# Patient Record
Sex: Female | Born: 1955 | Race: Black or African American | Hispanic: No | Smoking: Former smoker
Health system: Southern US, Community
[De-identification: ages and names within clinical notes are randomized; demographics above are authoritative.]

## PROBLEM LIST (undated history)

## (undated) DIAGNOSIS — N63 Unspecified lump in unspecified breast: Secondary | ICD-10-CM

## (undated) DIAGNOSIS — M1990 Unspecified osteoarthritis, unspecified site: Secondary | ICD-10-CM

## (undated) DIAGNOSIS — R112 Nausea with vomiting, unspecified: Secondary | ICD-10-CM

## (undated) DIAGNOSIS — I10 Essential (primary) hypertension: Secondary | ICD-10-CM

## (undated) DIAGNOSIS — L989 Disorder of the skin and subcutaneous tissue, unspecified: Secondary | ICD-10-CM

## (undated) DIAGNOSIS — D649 Anemia, unspecified: Secondary | ICD-10-CM

## (undated) DIAGNOSIS — C50919 Malignant neoplasm of unspecified site of unspecified female breast: Secondary | ICD-10-CM

## (undated) DIAGNOSIS — M7550 Bursitis of unspecified shoulder: Secondary | ICD-10-CM

## (undated) DIAGNOSIS — Z923 Personal history of irradiation: Secondary | ICD-10-CM

## (undated) DIAGNOSIS — C801 Malignant (primary) neoplasm, unspecified: Secondary | ICD-10-CM

## (undated) DIAGNOSIS — R222 Localized swelling, mass and lump, trunk: Secondary | ICD-10-CM

## (undated) DIAGNOSIS — Z9889 Other specified postprocedural states: Secondary | ICD-10-CM

## (undated) HISTORY — DX: Malignant neoplasm of unspecified site of unspecified female breast: C50.919

## (undated) HISTORY — DX: Essential (primary) hypertension: I10

## (undated) HISTORY — DX: Disorder of the skin and subcutaneous tissue, unspecified: L98.9

## (undated) HISTORY — DX: Unspecified lump in unspecified breast: N63.0

## (undated) HISTORY — DX: Malignant (primary) neoplasm, unspecified: C80.1

## (undated) HISTORY — DX: Unspecified osteoarthritis, unspecified site: M19.90

## (undated) HISTORY — PX: TOTAL HIP ARTHROPLASTY: SHX124

---

## 2002-02-02 ENCOUNTER — Emergency Department (HOSPITAL_COMMUNITY): Admission: EM | Admit: 2002-02-02 | Discharge: 2002-02-02 | Payer: Self-pay | Admitting: Emergency Medicine

## 2002-12-07 ENCOUNTER — Emergency Department (HOSPITAL_COMMUNITY): Admission: EM | Admit: 2002-12-07 | Discharge: 2002-12-07 | Payer: Self-pay | Admitting: Emergency Medicine

## 2002-12-07 ENCOUNTER — Encounter: Payer: Self-pay | Admitting: Emergency Medicine

## 2003-09-20 ENCOUNTER — Emergency Department (HOSPITAL_COMMUNITY): Admission: EM | Admit: 2003-09-20 | Discharge: 2003-09-20 | Payer: Self-pay | Admitting: Emergency Medicine

## 2004-01-29 ENCOUNTER — Inpatient Hospital Stay (HOSPITAL_COMMUNITY): Admission: RE | Admit: 2004-01-29 | Discharge: 2004-02-02 | Payer: Self-pay | Admitting: Orthopedic Surgery

## 2006-02-02 ENCOUNTER — Emergency Department (HOSPITAL_COMMUNITY): Admission: EM | Admit: 2006-02-02 | Discharge: 2006-02-02 | Payer: Self-pay | Admitting: Emergency Medicine

## 2007-05-17 ENCOUNTER — Encounter: Admission: RE | Admit: 2007-05-17 | Discharge: 2007-05-17 | Payer: Self-pay | Admitting: Internal Medicine

## 2007-05-23 ENCOUNTER — Encounter: Admission: RE | Admit: 2007-05-23 | Discharge: 2007-06-16 | Payer: Self-pay | Admitting: Internal Medicine

## 2007-07-27 HISTORY — PX: BREAST SURGERY: SHX581

## 2008-04-16 ENCOUNTER — Encounter: Admission: RE | Admit: 2008-04-16 | Discharge: 2008-04-16 | Payer: Self-pay | Admitting: Internal Medicine

## 2008-04-26 ENCOUNTER — Encounter: Admission: RE | Admit: 2008-04-26 | Discharge: 2008-04-26 | Payer: Self-pay | Admitting: Internal Medicine

## 2008-04-26 ENCOUNTER — Encounter (INDEPENDENT_AMBULATORY_CARE_PROVIDER_SITE_OTHER): Payer: Self-pay | Admitting: Diagnostic Radiology

## 2008-04-29 HISTORY — PX: BREAST BIOPSY: SHX20

## 2008-05-07 ENCOUNTER — Encounter: Admission: RE | Admit: 2008-05-07 | Discharge: 2008-05-07 | Payer: Self-pay | Admitting: Internal Medicine

## 2008-05-13 ENCOUNTER — Encounter: Admission: RE | Admit: 2008-05-13 | Discharge: 2008-05-13 | Payer: Self-pay | Admitting: General Surgery

## 2008-05-14 ENCOUNTER — Encounter: Admission: RE | Admit: 2008-05-14 | Discharge: 2008-05-14 | Payer: Self-pay | Admitting: General Surgery

## 2008-05-14 ENCOUNTER — Ambulatory Visit (HOSPITAL_BASED_OUTPATIENT_CLINIC_OR_DEPARTMENT_OTHER): Admission: RE | Admit: 2008-05-14 | Discharge: 2008-05-14 | Payer: Self-pay | Admitting: General Surgery

## 2008-05-14 ENCOUNTER — Encounter (INDEPENDENT_AMBULATORY_CARE_PROVIDER_SITE_OTHER): Payer: Self-pay | Admitting: General Surgery

## 2008-05-22 ENCOUNTER — Ambulatory Visit: Payer: Self-pay | Admitting: Oncology

## 2008-05-28 ENCOUNTER — Ambulatory Visit: Admission: RE | Admit: 2008-05-28 | Discharge: 2008-07-01 | Payer: Self-pay | Admitting: Radiation Oncology

## 2008-05-30 ENCOUNTER — Ambulatory Visit (HOSPITAL_BASED_OUTPATIENT_CLINIC_OR_DEPARTMENT_OTHER): Admission: RE | Admit: 2008-05-30 | Discharge: 2008-05-30 | Payer: Self-pay | Admitting: General Surgery

## 2008-05-30 ENCOUNTER — Encounter (INDEPENDENT_AMBULATORY_CARE_PROVIDER_SITE_OTHER): Payer: Self-pay | Admitting: General Surgery

## 2008-06-04 HISTORY — PX: BREAST LUMPECTOMY: SHX2

## 2008-06-10 LAB — CBC WITH DIFFERENTIAL/PLATELET
Basophils Absolute: 0 10*3/uL (ref 0.0–0.1)
EOS%: 0.7 % (ref 0.0–7.0)
HCT: 34.8 % (ref 34.8–46.6)
HGB: 11.8 g/dL (ref 11.6–15.9)
MCH: 29.4 pg (ref 26.0–34.0)
MCV: 86.2 fL (ref 81.0–101.0)
MONO%: 9.8 % (ref 0.0–13.0)
NEUT%: 59.1 % (ref 39.6–76.8)

## 2008-06-10 LAB — COMPREHENSIVE METABOLIC PANEL
AST: 14 U/L (ref 0–37)
Alkaline Phosphatase: 75 U/L (ref 39–117)
BUN: 16 mg/dL (ref 6–23)
Calcium: 9.9 mg/dL (ref 8.4–10.5)
Chloride: 105 mEq/L (ref 96–112)
Creatinine, Ser: 0.96 mg/dL (ref 0.40–1.20)
Glucose, Bld: 96 mg/dL (ref 70–99)

## 2008-07-05 ENCOUNTER — Ambulatory Visit: Admission: RE | Admit: 2008-07-05 | Discharge: 2008-09-17 | Payer: Self-pay | Admitting: Radiation Oncology

## 2008-09-04 ENCOUNTER — Ambulatory Visit: Payer: Self-pay | Admitting: Oncology

## 2008-09-12 LAB — CBC WITH DIFFERENTIAL/PLATELET
BASO%: 0.3 % (ref 0.0–2.0)
EOS%: 1.7 % (ref 0.0–7.0)
HCT: 37.2 % (ref 34.8–46.6)
LYMPH%: 20.2 % (ref 14.0–48.0)
MCH: 29.1 pg (ref 26.0–34.0)
MCHC: 33.8 g/dL (ref 32.0–36.0)
NEUT%: 66.7 % (ref 39.6–76.8)
lymph#: 2 10*3/uL (ref 0.9–3.3)

## 2008-09-12 LAB — COMPREHENSIVE METABOLIC PANEL
ALT: 14 U/L (ref 0–35)
AST: 19 U/L (ref 0–37)
Alkaline Phosphatase: 76 U/L (ref 39–117)
Chloride: 102 mEq/L (ref 96–112)
Creatinine, Ser: 1.01 mg/dL (ref 0.40–1.20)
Total Bilirubin: 0.4 mg/dL (ref 0.3–1.2)

## 2008-10-14 ENCOUNTER — Ambulatory Visit: Payer: Self-pay | Admitting: Oncology

## 2008-10-16 LAB — COMPREHENSIVE METABOLIC PANEL
Albumin: 4.2 g/dL (ref 3.5–5.2)
BUN: 13 mg/dL (ref 6–23)
CO2: 26 mEq/L (ref 19–32)
Calcium: 9.6 mg/dL (ref 8.4–10.5)
Chloride: 105 mEq/L (ref 96–112)
Glucose, Bld: 103 mg/dL — ABNORMAL HIGH (ref 70–99)
Potassium: 3.9 mEq/L (ref 3.5–5.3)
Sodium: 141 mEq/L (ref 135–145)
Total Protein: 7.2 g/dL (ref 6.0–8.3)

## 2008-10-16 LAB — CBC WITH DIFFERENTIAL/PLATELET
Basophils Absolute: 0 10*3/uL (ref 0.0–0.1)
Eosinophils Absolute: 0.1 10*3/uL (ref 0.0–0.5)
HGB: 12.3 g/dL (ref 11.6–15.9)
MCV: 84.7 fL (ref 79.5–101.0)
MONO#: 0.6 10*3/uL (ref 0.1–0.9)
NEUT#: 6.3 10*3/uL (ref 1.5–6.5)
RBC: 4.29 10*6/uL (ref 3.70–5.45)
RDW: 15.5 % — ABNORMAL HIGH (ref 11.2–14.5)
WBC: 9 10*3/uL (ref 3.9–10.3)
lymph#: 2 10*3/uL (ref 0.9–3.3)

## 2009-02-19 ENCOUNTER — Ambulatory Visit: Payer: Self-pay | Admitting: Oncology

## 2009-02-24 LAB — CBC WITH DIFFERENTIAL/PLATELET
Eosinophils Absolute: 0.1 10*3/uL (ref 0.0–0.5)
HCT: 36 % (ref 34.8–46.6)
LYMPH%: 23 % (ref 14.0–49.7)
MONO#: 0.7 10*3/uL (ref 0.1–0.9)
NEUT#: 6.6 10*3/uL — ABNORMAL HIGH (ref 1.5–6.5)
NEUT%: 68.3 % (ref 38.4–76.8)
Platelets: 327 10*3/uL (ref 145–400)
WBC: 9.7 10*3/uL (ref 3.9–10.3)
lymph#: 2.2 10*3/uL (ref 0.9–3.3)

## 2009-02-24 LAB — COMPREHENSIVE METABOLIC PANEL
AST: 15 U/L (ref 0–37)
Albumin: 3.8 g/dL (ref 3.5–5.2)
BUN: 17 mg/dL (ref 6–23)
Calcium: 9.6 mg/dL (ref 8.4–10.5)
Chloride: 102 mEq/L (ref 96–112)
Creatinine, Ser: 0.85 mg/dL (ref 0.40–1.20)
Glucose, Bld: 105 mg/dL — ABNORMAL HIGH (ref 70–99)

## 2009-02-24 LAB — LACTATE DEHYDROGENASE: LDH: 156 U/L (ref 94–250)

## 2009-04-17 ENCOUNTER — Encounter: Admission: RE | Admit: 2009-04-17 | Discharge: 2009-04-17 | Payer: Self-pay | Admitting: General Surgery

## 2009-06-24 ENCOUNTER — Ambulatory Visit: Payer: Self-pay | Admitting: Oncology

## 2009-06-26 ENCOUNTER — Ambulatory Visit (HOSPITAL_COMMUNITY): Admission: RE | Admit: 2009-06-26 | Discharge: 2009-06-26 | Payer: Self-pay | Admitting: Oncology

## 2009-06-26 LAB — COMPREHENSIVE METABOLIC PANEL
Alkaline Phosphatase: 66 U/L (ref 39–117)
BUN: 19 mg/dL (ref 6–23)
Creatinine, Ser: 0.92 mg/dL (ref 0.40–1.20)
Glucose, Bld: 90 mg/dL (ref 70–99)
Total Bilirubin: 0.3 mg/dL (ref 0.3–1.2)

## 2009-06-26 LAB — CBC WITH DIFFERENTIAL/PLATELET
Basophils Absolute: 0 10*3/uL (ref 0.0–0.1)
Eosinophils Absolute: 0.1 10*3/uL (ref 0.0–0.5)
HCT: 37.5 % (ref 34.8–46.6)
HGB: 12.5 g/dL (ref 11.6–15.9)
LYMPH%: 22.8 % (ref 14.0–49.7)
MCV: 84 fL (ref 79.5–101.0)
MONO%: 9.3 % (ref 0.0–14.0)
NEUT#: 7.2 10*3/uL — ABNORMAL HIGH (ref 1.5–6.5)
NEUT%: 66.8 % (ref 38.4–76.8)
Platelets: 351 10*3/uL (ref 145–400)
RBC: 4.47 10*6/uL (ref 3.70–5.45)

## 2009-06-26 LAB — LACTATE DEHYDROGENASE: LDH: 138 U/L (ref 94–250)

## 2009-07-24 ENCOUNTER — Ambulatory Visit (HOSPITAL_COMMUNITY): Admission: RE | Admit: 2009-07-24 | Discharge: 2009-07-24 | Payer: Self-pay | Admitting: Oncology

## 2009-10-24 ENCOUNTER — Ambulatory Visit: Payer: Self-pay | Admitting: Oncology

## 2009-10-28 LAB — COMPREHENSIVE METABOLIC PANEL
ALT: 10 U/L (ref 0–35)
AST: 12 U/L (ref 0–37)
Alkaline Phosphatase: 65 U/L (ref 39–117)
Calcium: 9.3 mg/dL (ref 8.4–10.5)
Chloride: 101 mEq/L (ref 96–112)
Creatinine, Ser: 0.88 mg/dL (ref 0.40–1.20)
Glucose, Bld: 98 mg/dL (ref 70–99)
Total Bilirubin: 0.3 mg/dL (ref 0.3–1.2)
Total Protein: 7 g/dL (ref 6.0–8.3)

## 2009-10-28 LAB — CBC WITH DIFFERENTIAL/PLATELET
BASO%: 0.2 % (ref 0.0–2.0)
EOS%: 0.7 % (ref 0.0–7.0)
LYMPH%: 22.6 % (ref 14.0–49.7)
MCHC: 32.6 g/dL (ref 31.5–36.0)
RBC: 4.43 10*6/uL (ref 3.70–5.45)
WBC: 10.5 10*3/uL — ABNORMAL HIGH (ref 3.9–10.3)

## 2009-10-28 LAB — LACTATE DEHYDROGENASE: LDH: 151 U/L (ref 94–250)

## 2010-02-25 ENCOUNTER — Ambulatory Visit: Payer: Self-pay | Admitting: Oncology

## 2010-02-27 LAB — COMPREHENSIVE METABOLIC PANEL
ALT: 9 U/L (ref 0–35)
Albumin: 3.9 g/dL (ref 3.5–5.2)
Alkaline Phosphatase: 60 U/L (ref 39–117)
BUN: 13 mg/dL (ref 6–23)
CO2: 25 mEq/L (ref 19–32)
Calcium: 9.7 mg/dL (ref 8.4–10.5)
Creatinine, Ser: 0.9 mg/dL (ref 0.40–1.20)
Potassium: 3.6 mEq/L (ref 3.5–5.3)
Total Bilirubin: 0.3 mg/dL (ref 0.3–1.2)

## 2010-02-27 LAB — CBC WITH DIFFERENTIAL/PLATELET
HCT: 38.1 % (ref 34.8–46.6)
HGB: 12.5 g/dL (ref 11.6–15.9)
MCHC: 32.8 g/dL (ref 31.5–36.0)
RDW: 16.4 % — ABNORMAL HIGH (ref 11.2–14.5)
WBC: 10.5 10*3/uL — ABNORMAL HIGH (ref 3.9–10.3)
nRBC: 0 % (ref 0–0)

## 2010-04-15 ENCOUNTER — Other Ambulatory Visit: Admission: RE | Admit: 2010-04-15 | Discharge: 2010-04-15 | Payer: Self-pay | Admitting: Obstetrics and Gynecology

## 2010-04-20 ENCOUNTER — Encounter: Admission: RE | Admit: 2010-04-20 | Discharge: 2010-04-20 | Payer: Self-pay | Admitting: Internal Medicine

## 2010-06-26 ENCOUNTER — Ambulatory Visit: Payer: Self-pay | Admitting: Oncology

## 2010-06-29 ENCOUNTER — Ambulatory Visit (HOSPITAL_COMMUNITY)
Admission: RE | Admit: 2010-06-29 | Discharge: 2010-06-29 | Payer: Self-pay | Source: Home / Self Care | Admitting: Oncology

## 2010-06-29 LAB — CBC WITH DIFFERENTIAL/PLATELET
Basophils Absolute: 0.1 10*3/uL (ref 0.0–0.1)
Eosinophils Absolute: 0.1 10*3/uL (ref 0.0–0.5)
HGB: 12.1 g/dL (ref 11.6–15.9)
LYMPH%: 23.1 % (ref 14.0–49.7)
MCH: 27.9 pg (ref 25.1–34.0)
MONO#: 0.9 10*3/uL (ref 0.1–0.9)
NEUT%: 65.9 % (ref 38.4–76.8)
RBC: 4.33 10*6/uL (ref 3.70–5.45)
lymph#: 2.2 10*3/uL (ref 0.9–3.3)

## 2010-06-29 LAB — COMPREHENSIVE METABOLIC PANEL
ALT: 11 U/L (ref 0–35)
AST: 14 U/L (ref 0–37)
Alkaline Phosphatase: 66 U/L (ref 39–117)
CO2: 27 mEq/L (ref 19–32)
Calcium: 9.7 mg/dL (ref 8.4–10.5)
Creatinine, Ser: 0.89 mg/dL (ref 0.40–1.20)
Total Bilirubin: 0.3 mg/dL (ref 0.3–1.2)

## 2010-09-01 ENCOUNTER — Encounter (HOSPITAL_BASED_OUTPATIENT_CLINIC_OR_DEPARTMENT_OTHER): Payer: PRIVATE HEALTH INSURANCE | Admitting: Oncology

## 2010-09-01 DIAGNOSIS — C50419 Malignant neoplasm of upper-outer quadrant of unspecified female breast: Secondary | ICD-10-CM

## 2010-09-01 DIAGNOSIS — D0590 Unspecified type of carcinoma in situ of unspecified breast: Secondary | ICD-10-CM

## 2010-09-16 ENCOUNTER — Other Ambulatory Visit (HOSPITAL_COMMUNITY): Payer: Self-pay | Admitting: Oncology

## 2010-09-16 DIAGNOSIS — Z09 Encounter for follow-up examination after completed treatment for conditions other than malignant neoplasm: Secondary | ICD-10-CM

## 2010-09-29 ENCOUNTER — Encounter (HOSPITAL_BASED_OUTPATIENT_CLINIC_OR_DEPARTMENT_OTHER): Payer: PRIVATE HEALTH INSURANCE | Admitting: Oncology

## 2010-09-29 DIAGNOSIS — C50419 Malignant neoplasm of upper-outer quadrant of unspecified female breast: Secondary | ICD-10-CM

## 2010-09-29 DIAGNOSIS — D0590 Unspecified type of carcinoma in situ of unspecified breast: Secondary | ICD-10-CM

## 2010-10-05 ENCOUNTER — Other Ambulatory Visit: Payer: PRIVATE HEALTH INSURANCE

## 2010-10-26 ENCOUNTER — Other Ambulatory Visit: Payer: PRIVATE HEALTH INSURANCE

## 2010-10-29 ENCOUNTER — Other Ambulatory Visit (HOSPITAL_COMMUNITY): Payer: Self-pay | Admitting: Oncology

## 2010-10-29 ENCOUNTER — Encounter (HOSPITAL_BASED_OUTPATIENT_CLINIC_OR_DEPARTMENT_OTHER): Payer: PRIVATE HEALTH INSURANCE | Admitting: Oncology

## 2010-10-29 DIAGNOSIS — C50419 Malignant neoplasm of upper-outer quadrant of unspecified female breast: Secondary | ICD-10-CM

## 2010-10-29 DIAGNOSIS — D0590 Unspecified type of carcinoma in situ of unspecified breast: Secondary | ICD-10-CM

## 2010-10-29 DIAGNOSIS — N959 Unspecified menopausal and perimenopausal disorder: Secondary | ICD-10-CM

## 2010-10-29 DIAGNOSIS — Z170 Estrogen receptor positive status [ER+]: Secondary | ICD-10-CM

## 2010-10-29 LAB — COMPREHENSIVE METABOLIC PANEL
ALT: <8 U/L (ref 0–35)
Alkaline Phosphatase: 66 U/L (ref 39–117)
Sodium: 141 mEq/L (ref 135–145)
Total Bilirubin: 0.3 mg/dL (ref 0.3–1.2)
Total Protein: 6.9 g/dL (ref 6.0–8.3)

## 2010-10-29 LAB — CBC WITH DIFFERENTIAL/PLATELET
HGB: 11.4 g/dL — ABNORMAL LOW (ref 11.6–15.9)
MCH: 27 pg (ref 25.1–34.0)
MONO%: 9.2 % (ref 0.0–14.0)
RDW: 17.2 % — ABNORMAL HIGH (ref 11.2–14.5)
WBC: 8.8 10*3/uL (ref 3.9–10.3)
lymph#: 2 10*3/uL (ref 0.9–3.3)

## 2010-11-16 ENCOUNTER — Ambulatory Visit
Admission: RE | Admit: 2010-11-16 | Discharge: 2010-11-16 | Disposition: A | Discharge status: Home / Self Care | Payer: PRIVATE HEALTH INSURANCE | Source: Ambulatory Visit | Attending: Oncology | Admitting: Oncology

## 2010-11-16 DIAGNOSIS — Z09 Encounter for follow-up examination after completed treatment for conditions other than malignant neoplasm: Secondary | ICD-10-CM

## 2010-12-08 NOTE — Op Note (Signed)
NAMEGEARLDINE, Kelli Abbott                 ACCOUNT NO.:  [id number]   MEDICAL RECORD NO.:  [id number]          PATIENT TYPE:  AMB   LOCATION:  DSC                          FACILITY:  [facility]   PHYSICIAN:  Almond Lint, MD       DATE OF BIRTH:  07-16-1956   DATE OF PROCEDURE:  05/30/2008  DATE OF DISCHARGE:                               OPERATIVE REPORT   PREOPERATIVE DIAGNOSIS:  T1, N0, Mx breast cancer on the right.   POSTOPERATIVE DIAGNOSIS:  T1, N0, Mx breast cancer on the right.   PROCEDURE PERFORMED:  Re-excision of right breast cancer margins.   SURGEON:  Almond Lint, MD   ANESTHESIA:  MAC and local.   FINDINGS:  Small seroma.   SPECIMENS:  New lateral margin and new posterior margins to Pathology.   ESTIMATED BLOOD LOSS:  Minimal.   COMPLICATIONS:  Unknown.   PROCEDURE:  [name] was identified in the holding area, taken to the  operating room where was she was placed supine on the operating room  table.  MAC anesthesia was induced.  Her right breast was prepped and  draped in sterile fashion.  Time-out was performed according to the  surgical safety checklist.  When things were correct, we elected to  continue.  The incision was anesthetized with a mixture of 1% lidocaine  plain and 0.25% Marcaine with epinephrine.  Deep tissues were also  anesthetized.  The old incision was entered sharply and the subcutaneous  tissues also entered with a knife.  Small seroma was evacuated.  [employer], water were placed into the breast and the lateral margin was  identified.  Further numbing medicine was placed in this area.  A large  crescent of tissue was taken on the lateral margin with the Bovie  electrocautery.  This was then marked as short superior, long new  lateral margin.  This area was inspected for hemostasis and was achieved  with the Bovie electrocautery.  The posterior margin was then identified  and additional tissue was taken posteriorly along the back wall of the  cavity.  This also was performed with the Bovie electrocautery.  Once  this was removed, this also was marked short superior long new posterior  margin.  The area was irrigated copiously and re-inspected for  hemostasis.  There were several areas that had to be coagulated with the  Bovie electrocautery.  Once that was done, the clips were placed at the  lateral, medial, superior, inferior, and posterior borders of the  cavity.  The area was re-irrigated and the skin margins were trimmed  with tenotomy scissors.  The skin edges were coagulated with the Bovie  electrocautery on the dermal  portion to control the bleeding.  Skin edges were then re-approximated  with 3-0 Vicryl interrupted deep dermal sutures and 4-0 Monocryl running  subcuticular suture.  The patient was awakened from anesthesia and taken  to the PACU in stable condition.      Almond Lint, MD  Electronically Signed     FB/MEDQ  D:  05/30/2008  T:  05/31/2008  Job:  [id number]

## 2010-12-08 NOTE — Op Note (Signed)
Kelli Abbott, Kelli Abbott                 ACCOUNT NO.:  [id number]   MEDICAL RECORD NO.:  [id number]          PATIENT TYPE:  AMB   LOCATION:  DSC                          FACILITY:  [facility]   PHYSICIAN:  Almond Lint, MD       DATE OF BIRTH:  [birth year]   DATE OF PROCEDURE:  05/14/2008  DATE OF DISCHARGE:                               OPERATIVE REPORT   PREOPERATIVE DIAGNOSIS:  Right breast cancer.   POSTOPERATIVE DIAGNOSIS:  Right breast cancer.   PROCEDURE PERFORMED:  1. Right needle localized segmental mastectomy.  2. Right-sided lymphoscintigraphy.  3. Right sentinel lymph node biopsy.  4. Mastopexy.   SURGEON:  Almond Lint, MD   ASSISTANT:  None.   ANESTHESIA:  General and local.   SPECIMENS:  1. Sentinel lymph node with counts per second of 1600 on average.  2. Palpable node, background was 20 counts per second.   OTHER FINDINGS:  On frozen, sentinel lymph node #1 was 2 lymph nodes  which were negative and sentinel lymph node #2 which was the third node  was also negative.  The right breast specimen findings were that the  clip and the mammography abnormality was in the breast specimen.   FINDINGS:  Negative sentinel lymph node by frozen.   ESTIMATED BLOOD LOSS:  25 mL.   COMPLICATIONS:  None known.   PROCEDURE:  Ms. Tabares was identified in the holding area and taken to  the operating room where she was placed supine on the operating room  table.  General anesthesia was induced.  Her right breast and arm were  prepped and draped in a sterile fashion such that the arm was free  draped into the field.  The probe was used to identify the point of  maximum counts.  Blue dye was injected into the breast at the area of  the tumor.  This was 2 mL of methylene blue mixed in a total of 5 mL of  volume made up by saline.  The breast was massaged for several minutes  and at least 5 minutes was allowed to elapse prior to incision.  The  crease of the axilla was the point of  maximum counts.  A 4-5 cm incision  was made in this crease and the subcutaneous tissue was divided with  Bovie electrocautery.  The clavipectoral fascia was traversed with the  Bovie as well.  The probe was used to direct the dissection further.  There was immediately a lymph node visible that was both palpable and  had a hint of blue in one of the sides of the lymph node.  This was  elevated with an Allis and the Bovie was used to dissect the areolar  attachments and the lymphatics and blood vessels entering the node were  clipped with the automatic clip applier.  There was an additional node  that was palpable beside of this and this was taken in a similar  fashion.  There was a small vessel leading to the second node that tore  and this was elevated and clipped with the clip applier.  Once these 2  nodes were removed, their counts for second were examined.  The first  node with 1600 counts per second on average and the second node was  palpable, but not hard.  The remainder of the axilla was examined with  the NeoProbe and the background count was 20 counts per second.  The  area in axilla was then irrigated and several areas of bleeding were  addressed first with the clip applier and then with the Bovie  electrocautery.  The axilla was palpated and there were several shotty  lymph nodes palpable, but no large lymph nodes palpable.  The axilla was  then again irrigated and packed with a sponge while the attention was  directed to the breast.  The lymph nodes were sent off for frozen  section.   The right breast mammograms were examined to identify an appropriate  area for an incision.  A 4-cm incision was made that was curvilinear and  parallel to the curve of the areola.  This was approximately 6 cm from  the edge of the areola.  The subcutaneous tissue was divided with the  Bovie electrocautery.  Skin flaps were created using the Bovie and skin  hooks.  When the lateral flap was  created, the wire was pulled into the  wound from the surface of the skin.  Allis clamps were used to elevate  the breast tissue in question.  The Bovie was used to resect the portion  of the breast.  The wire was included in the specimen.  There was a  blood vessel that was encountered at the very lateral edge of the breast  specimen that required clipping.  In doing so, the wire was dislodged  from the specimen.  However, the specimen was removed immediately  thereafter.  The sutures were placed for marking, short, superior, and  long lateral.  The breast was then irrigated copiously.  Bleeding was  addressed with Bovie electrocautery.  There was again irrigation of the  breast tissue and again inspection for hemostasis.  This was then  packed.  At that time, I had the specimen mammogram obtained and the  [facility] confirmed that the clip and mammographic abnormality were  in the specimen.  The breast was begun to be closed and at that point,  Pathology called back and also confirmed negative frozen sections on the  lymph node.  The breast was closed in multiple bleeders because there  was a defect that approximated well with the mastopexy.  More skin flap  was created on the superior aspect in order to bring some the breast  tissue down to close the defect.  This was secured with the 3-0 Vicryl.  Following several interrupted 3-0 Vicryl sutures to improve the cosmesis  of the breast, the skin was then closed using 3-0 Vicryl interrupted  deep dermal sutures and 4-0 Monocryl running subcuticular suture.  The  axilla was closed in a similar fashion with 2 stitches in the  clavipectoral fascia in an interrupted fashion and then the skin was  closed with deep dermal 3-0 Vicryl and 4-0 Monocryl in a running  fashion.  These were then dressed with Steri-Strips.  The axilla was  dressed additionally with gauze and Tegaderm and the breast was dressed  with gauze and Ace wrap.  The patient  was awakened from anesthesia and  taken to PACU in stable condition.  Needle and sponge counts were  correct x2.  Almond Lint, MD  Electronically Signed     FB/MEDQ  D:  05/14/2008  T:  05/15/2008  Job:  [id number]

## 2010-12-11 NOTE — H&P (Signed)
NAME:  Kelli Abbott, Kelli Abbott                           ACCOUNT NO.:  [id number]   MEDICAL RECORD NO.:  [id number]                   PATIENT TYPE:  INP   LOCATION:  0473                                 FACILITY:  [facility]   PHYSICIAN:  Ollen Gross, M.D.                 DATE OF BIRTH:  10-04-1955   DATE OF ADMISSION:  01/29/2004  DATE OF DISCHARGE:  02/02/2004                                HISTORY & PHYSICAL   CHIEF COMPLAINT:  Right hip pain.   HISTORY OF PRESENT ILLNESS:  Patient is a 55 year old female seen by Dr.  Lequita Halt for ongoing right hip pain.  She states she has had a 5-6 month  progressive history with pain in the thigh radiating down to her knee, it is  to the point where it is hurting her all the time.  She was a CNA at [employer], has been unable to work due to her pain.  She had initially  seen Dr. 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  She underwent intraarticular  injection, with anything made her worse.  She is now at a stage where she  cannot get comfortable at all.  She was seen in the office and x-rays of the  knee were essentially normal with only minimal changes on her MRI.  The x-  rays taken of the hip today are seen and found to have extensive arthritic  changes in the right hip with bone-on-bone changes and a large osteophyte  formation.  She is already at a point where she has end-stage arthritis.  It  is felt she has reached the point where she could benefit undergoing hip  replacement.  Risks and benefits have been discussed with the patient and  she elects to receive this surgery.   ALLERGIES:  NONE.  NO DRUG ALLERGIES.   CURRENT MEDICATIONS:  1. Percocet.  2. Diovan 160/12.5 two every a.m.   PAST MEDICAL HISTORY:  1. Hypertension.  2. Arthritis.   PAST SURGICAL HISTORY:  D&C.   SOCIAL HISTORY:  She is single,  works as a Psychologist, sport and exercise.  Less than a pack per  day smoker.  No alcohol.  Has one child.   FAMILY HISTORY:  Mother and father both have a history of hypertension.  Father with a history of arthritis.   REVIEW OF SYSTEMS:  GENERAL:  No fever, chills, or night sweats.  NEURO:  No  __________ paralysis.  RESPIRATORY:  No shortness of breath, productive  cough or hemoptysis.  CARDIOVASCULAR:  No chest pain, angina, orthopnea.  GI:  No nausea, vomiting, diarrhea, constipation.  GU:  No dysuria,  hematuria or discharge.  MUSCULOSKELETAL:  Pertinent to the hip __________.   PHYSICAL EXAMINATION:  VITAL SIGNS:  Pulse 76, respirations 16,  blood  pressure 144/88.  GENERAL:  A 55 year old female well-nourished, well-developed, no acute  distress.  She is alert and oriented, cooperative.  HEENT:  Normocephalic, atraumatic.  Pupils round and reactive.  EOMs are  intact.  NECK:  Supple.  CHEST:  Clear.  HEART:  Regular rate and rhythm.  No murmurs.  ABDOMEN:  Soft, slightly round abdomen.  Bowel sounds present.  RECTAL/BREASTS/GENITALIA:  Not done, not pertinent to present illness.  EXTREMITIES:  __________ to the right hip.  She does have limited flexion in  the hip with only flexion up to about 75 degrees.  There is no internal or  external rotation, only abduction of about 20 degrees.  She does ambulate  with a markedly antalgic gait.   IMPRESSION:  1. Osteoarthritis right hip.  2. Hypertension.   PLAN:  Patient admitted to [facility] to undergo right total hip  arthroplasty.  The surgery will be performed by Dr. Ollen Gross.     Kelli Abbott, P.A.              Ollen Gross, M.D.    ALP/MEDQ  D:  02/23/2004  T:  02/23/2004  Job:  [id number]

## 2010-12-11 NOTE — Discharge Summary (Signed)
NAME:  Kelli Abbott, Kelli Abbott                           ACCOUNT NO.:  [id number]   MEDICAL RECORD NO.:  [id number]                   PATIENT TYPE:  INP   LOCATION:  0473                                 FACILITY:  [facility]   PHYSICIAN:  Ollen Gross, M.D.                 DATE OF BIRTH:  03-04-1956   DATE OF ADMISSION:  01/29/2004  DATE OF DISCHARGE:  02/02/2004                                 DISCHARGE SUMMARY   ADMISSION DIAGNOSES:  1. Osteoarthritis, right hip.  2. Hypertension.   DISCHARGE DIAGNOSES:  1. Osteoarthritis, right hip, status post right total hip arthroplasty.  2. Postoperative hyperkalemia, improved.  3. Postoperative hyponatremia.  4. Hypertension.   PROCEDURE:  The patient was taken to the OR on January 29, 2004, and underwent a  right total hip arthroplasty; surgeon Ollen Gross, M.D.; assistant  Alexzandrew L. Perkins, PA-C; anesthesia general; blood loss was 600 cc;  Hemovac drain times one.   CONSULTATIONS:  None.   BRIEF HISTORY:  This is a 55 year old female with a history of six to eight  month history of progressive debilitating right hip and thigh pain. She was  found to have osteoarthritis proven on x-rays to be quite severe. It was  felt that she would benefit from undergoing a hip replacement. The risks and  benefits discussed. The patient is subsequently admitted to the hospital for  procedure.   LABORATORY DATA:  CBC on admission revealed a hemoglobin of 13.1, hematocrit  39.9, white blood cell count 1.7, red cell count 4.49. Differential within  normal limits. Postoperative H&H 12.6 and 34.5; last known H&H was 11.2 and  33.6.  PT and PTT on admission was 12.5 and 30 respectively with an INR of  0.9. Serial protimes followed total hip protocol. Last known PT-INR 18.4 and  1.8.  Chem panel on admission revealed low albumin of 3.4. The remaining  chem panel within normal limits. Serial BMETs were followed. Sodium dropped  from 138 down to 132 and last noted  131.  Potassium dropped down to 3.5,  down to as low as 2.7, back up to 3.6 with potassium supplement. Glucose  went up from 90 to 132 and back down to 96.  Calcium dropped to 9.3 to 8.3  and back up to 8.6. Urinalysis preoperatively revealed large hemoglobin,  trace leukocyte esterase, a few epithelial cells, 0-2 white cells, 3-6 red  cells, rare bacteria. Blood group type A positive.   EKG dated January 24, 2004, revealed normal sinus rhythm with normal EKG  confirmed by Dr. Rollene Rotunda. Right hip films dated January 24, 2004,  revealed right osteoarthritis. Postoperative hip and pelvis film on January 29, 2004, revealed satisfactory occurrence of right total hip replacement.   HOSPITAL COURSE:  The patient admitted to [facility], taken to the  operating room, and underwent the above-stated procedure without  complications. The patient tolerated the  procedure well, later transferred  to the recovery room, and then to the orthopedic floor for continued  postoperative care. Vital signs were followed, given 24 hours of  postoperative IV antibiotics, placed on Coumadin, placed on partial  weightbearing 50%. PT and OT were consulted postoperatively. Hemovac drain  placed at the time of surgery, pulled on day one. She did get up out of bed  and by day two her pain had greatly improved. She did very well with  physical therapy advancing approximately 90 feet by day two twice, by day  three she did just as well. Dressing changes initiated on day two and  incision was healing well. She continued to walk very well physical therapy.  By day four, she was feeling good, pain under control, and wanted to go  home.   PLAN:  1. The patient is discharged on February 02, 2004.  2. For discharge diagnosis, please see above.  3. Discharge  medications--Percocet, Robaxin, Trinsicon, and Coumadin.  4. Diet--as tolerated, low-sodium diet.  5. Activity--50% partial weightbearing, home health PT and home  health     nursing for total hip protocol.  6. Followup--two weeks from day of surgery, to call the office for an     appointment at [phone].   DISPOSITION:  Home.   CONDITION ON DISCHARGE:  Improved.     Alexzandrew L. Julien Girt, P.A.              Ollen Gross, M.D.    ALP/MEDQ  D:  03/02/2004  T:  03/03/2004  Job:  [id number]   cc:   Georgann Housekeeper, M.D.  [address]  [place]  [place] [address]  Fax: [phone]

## 2010-12-11 NOTE — Op Note (Signed)
NAME:  Kelli Abbott, Kelli Abbott                           ACCOUNT NO.:  [id number]   MEDICAL RECORD NO.:  [id number]                   PATIENT TYPE:  INP   LOCATION:  0005                                 FACILITY:  [facility]   PHYSICIAN:  Ollen Gross, M.D.                 DATE OF BIRTH:  1956-01-04   DATE OF PROCEDURE:  01/29/2004  DATE OF DISCHARGE:                                 OPERATIVE REPORT   PREOPERATIVE DIAGNOSES:  Osteoarthritis right hip.   POSTOPERATIVE DIAGNOSES:  Osteoarthritis right hip.   PROCEDURE:  Right total hip arthroplasty.   SURGEON:  Ollen Gross, M.D.   ASSISTANT:  Alexzandrew L. Julien Girt, P.A.   ANESTHESIA:  General.   ESTIMATED BLOOD LOSS:  600   DRAINS:  Hemovac x1.   COMPLICATIONS:  None.   CONDITION:  Stable to recovery.   BRIEF CLINICAL NOTE:  Kelli Abbott is a 55 year old female who has had a 6-8 month  history of progressively debilitating right thigh and hip pain. Exam and  history suggest osteoarthritis of the hip and radiograph showed severe end-  stage arthritis. She presents now for right total hip arthroplasty secondary  to failure of nonoperative management.   DESCRIPTION OF PROCEDURE:  After successful administration of general  anesthetic, the patient was placed in the left lateral decubitus position  with the right side up and held with a hip positioner. The right lower  extremity was isolated from the perineum with plastic drapes and prepped and  draped in the usual sterile fashion. A long posterolateral incision was made  and the skin cut through the subcutaneous tissue which was about an 8 inch  layer down to the level of the fascia lata. This is incised in line with the  skin incision. The sciatic nerve was palpated and protected and short  rotators isolated off the femur. Capsulectomy was performed and the hip  dislocated. The center of the femoral head is marked and a trial prosthesis  placed such that the center of trial head corresponds  to the center of the  native femoral head. Osteotomy lines marked on the femoral neck and  osteotomy made with an oscillating saw. The femur is then retracted  anteriorly to gain acetabular exposure. She had a rather shallow acetabulum.  We removed the labrum and osteophytes and soft tissue in the fovea and began  reaming at 45 coursing in increments of 2 to a 51 and I placed a 52 mm  pinnacle acetabular shell in anatomic position and transfixed with two dome  screws. There was about 20% on coverage posterior superiorly otherwise  excellent bony coverage. A trial 36 mm neutral liner is placed.   The femur is prepared first with a canal finder and then irrigation. Axial  reaming is performed to 13.5 mm and proximal reaming to an 18D and the  sleeve machined to a small. The 18D small trial sleeve  was placed with 18 x  13 stem at a 36 plus 8 trial neck. We went about 10 degrees beyond her  native anteversion which was neutral. A trial 36 plus zero head was placed.  The hip is reduced with excellent stability, full extension, full external  rotation, 70 degrees of flexion, 40 degrees adduction, 90 degrees internal  rotation and 90 degrees flexion, 70 degrees internal rotation. By placing  the right leg on top of the left, the leg lengths were equal. The hip was  then dislocated and the trial was removed.   The permanent apex hole eliminator was placed into the acetabular shell.  A  permanent 36 mm neutral Ultramet metal liner was placed into the shell. This  is a metal on metal hip replacement. The 18D small sleeve is placed with the  18 x 13 stem and a 36 plus 8 neck again 10 degrees beyond her native  anteversion. A 36 plus zero head is placed and the hip is reduced with the  same stability parameters. The wound is copiously irrigated with antibiotic  solution, short rotators reattached to the femur through drill holes with  Ethibond suture. The fascia lata is closed over a Hemovac drain  with  interrupted #1 Vicryl and the subcu closed in three layers with interrupted  #1 Vicryl x2 and then 2-0 Vicryl x1.  We then closed the subcuticular layer  with a running 4-0 Monocryl. The incision is cleaned and dried and Steri-  Strips and a bulky sterile dressing applied. She is then awakened and  transported to the recovery room in stable condition.                                               Ollen Gross, M.D.    FA/MEDQ  D:  01/29/2004  T:  01/29/2004  Job:  [id number]

## 2010-12-29 ENCOUNTER — Other Ambulatory Visit (HOSPITAL_COMMUNITY): Payer: Self-pay | Admitting: Oncology

## 2010-12-29 ENCOUNTER — Encounter (HOSPITAL_BASED_OUTPATIENT_CLINIC_OR_DEPARTMENT_OTHER): Payer: PRIVATE HEALTH INSURANCE | Admitting: Oncology

## 2010-12-29 DIAGNOSIS — N959 Unspecified menopausal and perimenopausal disorder: Secondary | ICD-10-CM

## 2010-12-29 DIAGNOSIS — D0590 Unspecified type of carcinoma in situ of unspecified breast: Secondary | ICD-10-CM

## 2010-12-29 DIAGNOSIS — C50419 Malignant neoplasm of upper-outer quadrant of unspecified female breast: Secondary | ICD-10-CM

## 2010-12-29 LAB — COMPREHENSIVE METABOLIC PANEL
Albumin: 4.1 g/dL (ref 3.5–5.2)
Alkaline Phosphatase: 73 U/L (ref 39–117)
BUN: 15 mg/dL (ref 6–23)
Calcium: 10.1 mg/dL (ref 8.4–10.5)
Creatinine, Ser: 0.96 mg/dL (ref 0.50–1.10)
Glucose, Bld: 91 mg/dL (ref 70–99)
Potassium: 4 mEq/L (ref 3.5–5.3)

## 2010-12-29 LAB — CBC WITH DIFFERENTIAL/PLATELET
BASO%: 0.4 % (ref 0.0–2.0)
Eosinophils Absolute: 0.1 10*3/uL (ref 0.0–0.5)
LYMPH%: 20.4 % (ref 14.0–49.7)
MCHC: 32.8 g/dL (ref 31.5–36.0)
MCV: 82.2 fL (ref 79.5–101.0)
MONO%: 9 % (ref 0.0–14.0)
Platelets: 318 10*3/uL (ref 145–400)
RBC: 4.49 10*6/uL (ref 3.70–5.45)

## 2011-02-19 ENCOUNTER — Ambulatory Visit (INDEPENDENT_AMBULATORY_CARE_PROVIDER_SITE_OTHER): Payer: Self-pay | Admitting: General Surgery

## 2011-02-23 ENCOUNTER — Encounter (INDEPENDENT_AMBULATORY_CARE_PROVIDER_SITE_OTHER): Payer: Self-pay

## 2011-03-01 ENCOUNTER — Ambulatory Visit (INDEPENDENT_AMBULATORY_CARE_PROVIDER_SITE_OTHER): Payer: Self-pay | Admitting: General Surgery

## 2011-03-05 ENCOUNTER — Other Ambulatory Visit (HOSPITAL_COMMUNITY): Payer: Self-pay | Admitting: Oncology

## 2011-03-05 ENCOUNTER — Ambulatory Visit (HOSPITAL_BASED_OUTPATIENT_CLINIC_OR_DEPARTMENT_OTHER): Payer: PRIVATE HEALTH INSURANCE | Admitting: Oncology

## 2011-03-05 DIAGNOSIS — N959 Unspecified menopausal and perimenopausal disorder: Secondary | ICD-10-CM

## 2011-03-05 DIAGNOSIS — C50419 Malignant neoplasm of upper-outer quadrant of unspecified female breast: Secondary | ICD-10-CM

## 2011-03-05 DIAGNOSIS — D0590 Unspecified type of carcinoma in situ of unspecified breast: Secondary | ICD-10-CM

## 2011-03-05 LAB — COMPREHENSIVE METABOLIC PANEL
AST: 16 U/L (ref 0–37)
Albumin: 3.8 g/dL (ref 3.5–5.2)
Alkaline Phosphatase: 66 U/L (ref 39–117)
BUN: 13 mg/dL (ref 6–23)
Potassium: 3.5 mEq/L (ref 3.5–5.3)

## 2011-03-05 LAB — CBC WITH DIFFERENTIAL/PLATELET
Basophils Absolute: 0.1 10*3/uL (ref 0.0–0.1)
Eosinophils Absolute: 0.1 10*3/uL (ref 0.0–0.5)
HCT: 36.1 % (ref 34.8–46.6)
HGB: 12 g/dL (ref 11.6–15.9)
MCV: 82.1 fL (ref 79.5–101.0)
NEUT#: 7.9 10*3/uL — ABNORMAL HIGH (ref 1.5–6.5)
RDW: 17.8 % — ABNORMAL HIGH (ref 11.2–14.5)
lymph#: 1.9 10*3/uL (ref 0.9–3.3)

## 2011-04-26 LAB — BASIC METABOLIC PANEL
BUN: 11
Calcium: 9.7
Creatinine, Ser: 0.75
GFR calc non Af Amer: >60
Glucose, Bld: 82
Potassium: 4.3

## 2011-04-26 LAB — URINE MICROSCOPIC-ADD ON

## 2011-04-26 LAB — CBC
HCT: 41.2
Platelets: 310
RDW: 15.5

## 2011-04-26 LAB — PROTIME-INR: Prothrombin Time: 13.1

## 2011-04-26 LAB — DIFFERENTIAL
Basophils Absolute: 0.1
Eosinophils Relative: 1
Lymphocytes Relative: 29
Neutrophils Relative %: 59

## 2011-04-26 LAB — URINALYSIS, ROUTINE W REFLEX MICROSCOPIC
Glucose, UA: NEGATIVE
Ketones, ur: NEGATIVE
Protein, ur: NEGATIVE

## 2011-04-27 LAB — BASIC METABOLIC PANEL
GFR calc non Af Amer: >60
Potassium: 3.2 — ABNORMAL LOW
Sodium: 139

## 2011-04-27 LAB — POCT HEMOGLOBIN-HEMACUE: Hemoglobin: 12.3

## 2011-04-28 ENCOUNTER — Other Ambulatory Visit: Payer: Self-pay | Admitting: Internal Medicine

## 2011-04-28 DIAGNOSIS — N649 Disorder of breast, unspecified: Secondary | ICD-10-CM

## 2011-05-13 ENCOUNTER — Ambulatory Visit
Admission: RE | Admit: 2011-05-13 | Discharge: 2011-05-13 | Disposition: A | Discharge status: Home / Self Care | Payer: PRIVATE HEALTH INSURANCE | Source: Ambulatory Visit | Attending: Internal Medicine | Admitting: Internal Medicine

## 2011-05-13 ENCOUNTER — Other Ambulatory Visit: Payer: Self-pay | Admitting: Internal Medicine

## 2011-05-13 DIAGNOSIS — N649 Disorder of breast, unspecified: Secondary | ICD-10-CM

## 2011-05-14 ENCOUNTER — Other Ambulatory Visit: Payer: Self-pay | Admitting: Obstetrics and Gynecology

## 2011-05-14 ENCOUNTER — Other Ambulatory Visit (HOSPITAL_COMMUNITY)
Admission: RE | Admit: 2011-05-14 | Discharge: 2011-05-14 | Disposition: A | Discharge status: Home / Self Care | Payer: PRIVATE HEALTH INSURANCE | Source: Ambulatory Visit | Attending: Obstetrics and Gynecology | Admitting: Obstetrics and Gynecology

## 2011-05-14 DIAGNOSIS — Z1159 Encounter for screening for other viral diseases: Secondary | ICD-10-CM | POA: Insufficient documentation

## 2011-05-14 DIAGNOSIS — Z01419 Encounter for gynecological examination (general) (routine) without abnormal findings: Secondary | ICD-10-CM | POA: Insufficient documentation

## 2011-05-24 ENCOUNTER — Encounter: Payer: Self-pay | Admitting: Oncology

## 2011-06-03 ENCOUNTER — Encounter: Payer: Self-pay | Admitting: *Deleted

## 2011-06-03 ENCOUNTER — Other Ambulatory Visit (HOSPITAL_COMMUNITY): Payer: Self-pay | Admitting: Oncology

## 2011-06-03 ENCOUNTER — Telehealth: Payer: Self-pay | Admitting: Oncology

## 2011-06-03 ENCOUNTER — Ambulatory Visit (HOSPITAL_BASED_OUTPATIENT_CLINIC_OR_DEPARTMENT_OTHER): Payer: PRIVATE HEALTH INSURANCE | Admitting: Physician Assistant

## 2011-06-03 ENCOUNTER — Other Ambulatory Visit (HOSPITAL_BASED_OUTPATIENT_CLINIC_OR_DEPARTMENT_OTHER): Payer: PRIVATE HEALTH INSURANCE | Admitting: Lab

## 2011-06-03 VITALS — BP 133/93 | HR 85 | Temp 96.8°F | Ht 61.5 in | Wt 321.9 lb

## 2011-06-03 DIAGNOSIS — N6482 Hypoplasia of breast: Secondary | ICD-10-CM

## 2011-06-03 DIAGNOSIS — C50919 Malignant neoplasm of unspecified site of unspecified female breast: Secondary | ICD-10-CM

## 2011-06-03 DIAGNOSIS — R5383 Other fatigue: Secondary | ICD-10-CM

## 2011-06-03 DIAGNOSIS — R5381 Other malaise: Secondary | ICD-10-CM

## 2011-06-03 DIAGNOSIS — C50419 Malignant neoplasm of upper-outer quadrant of unspecified female breast: Secondary | ICD-10-CM

## 2011-06-03 LAB — COMPREHENSIVE METABOLIC PANEL
Albumin: 3.8 g/dL (ref 3.5–5.2)
Alkaline Phosphatase: 64 U/L (ref 39–117)
CO2: 30 mEq/L (ref 19–32)
Glucose, Bld: 95 mg/dL (ref 70–99)
Potassium: 3.9 mEq/L (ref 3.5–5.3)
Sodium: 140 mEq/L (ref 135–145)
Total Protein: 7.4 g/dL (ref 6.0–8.3)

## 2011-06-03 LAB — CBC WITH DIFFERENTIAL/PLATELET
Basophils Absolute: 0.1 10*3/uL (ref 0.0–0.1)
EOS%: 0.9 % (ref 0.0–7.0)
Eosinophils Absolute: 0.1 10*3/uL (ref 0.0–0.5)
HGB: 12.3 g/dL (ref 11.6–15.9)
NEUT#: 7.1 10*3/uL — ABNORMAL HIGH (ref 1.5–6.5)
RDW: 17 % — ABNORMAL HIGH (ref 11.2–14.5)
lymph#: 2.4 10*3/uL (ref 0.9–3.3)

## 2011-06-03 NOTE — Progress Notes (Signed)
This office note has been dictated.

## 2011-06-03 NOTE — Progress Notes (Signed)
CC:   Georgann Housekeeper, MD Almond Lint, MD [name], Ph.D., M.D. 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  Since her last clinic visit in August 2012, she reports occasional mild fatigue, but no problems with ADLs.  No fevers, chills, or night sweats.  She does have intermittent hot flashes, but states these are not intolerable.  She has had no problems with dyspnea or cough.  She has normal appetite and has not experienced any nausea, vomiting, abdominal pain, constipation, or diarrhea.  No dysuria, no frequency or hematuria. No vaginal bleeding.  No problems with swelling of extremities.  CURRENT MEDICATIONS:  Current medications are reviewed and recorded.  Also of note, the patient states that she had a pelvic exam within the last month which was within normal limits.  PHYSICAL EXAMINATION:  Vital Signs:  Temperature is 96.8, heart rate 85, respirations 20, blood pressure 133/93, weight 321 pounds.  General: This is a well-developed, well-nourished black female in no acute distress.  HEENT:  Sclerae are nonicteric.  There is no thrush or mucositis.  Skin:  Without rashes or lesions.  Lymph:  No cervical, supraclavicular, axillary, or inguinal lymphadenopathy.  Cardiac: Regular rate and rhythm without murmurs or gallops.  Peripheral pulses are palpable.  Chest:  Lungs clear to auscultation.  Breast Exam: Bilateral breast exam without masses or nipple discharge.  She continues to have a nodular area along her approximated incision site, right breast from previous lumpectomy.  This is not tender.  Abdomen: Positive bowel sounds.  Soft, nontender, nondistended.  No organomegaly. Extremities:  No edema, cyanosis, or calf tenderness.  Neuro:  Alert and oriented x3.  Strength, sensation, and coordination all grossly intact.  LABORATORIES:  CBC with diff reveals white  blood count of 10.7, hemoglobin 12.3, hematocrit 37.1, platelets of 328, ANC of 7.1, and MCV of 82.6.  CMET and LDH are currently pending.  IMPRESSIONS/PLAN: 1. 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  There was     no lymphovascular invasion and margins were negative after re-     excision November 2009.  There were also 3 negative sentinel lymph     nodes.  Estrogen receptor/progesterone receptor receptors were 99%     and HER-2/neu was 2+, but negative by FISH, tumor stage T1 N1.  She     received external radiation therapy between July 22, 2008, and     September 11, 2008, and then was initiated on tamoxifen September 12, 2008, with a planned 5-year course.  She is tolerating her     tamoxifen with mild hot flashes and will continue. 2. The patient last had chest x-ray, PA and lateral, in December 2011.     We will schedule repeat chest x-ray, PA and lateral, in December of     this year. 3. The patient last had bilateral mammogram and right breast     ultrasound on May 13, 2011.  This revealed stable post     lumpectomy changes of the upper outer quadrant of the right breast,     stable well-defined hypoechoic mass at the 10 o'clock position of     the right breast, 7 cm from the nipple,  measuring 4 x 8 x 9 mm.     Recommendation was for additional diagnostic right breast mammogram     and ultrasound in 12 months to evaluate this probable benign     abnormality. 4. The patient will be scheduled for a followup appointment with Dr.     Arline Asp in 3 months' time, at which time we will reassess CBC with     diff, CMET, and LDH.  She is advised to call in the interim if any     questions or problems.    ______________________________ Sherilyn Banker, MSN, ANP, BC RJ/MEDQ  D:  06/03/2011  T:  06/03/2011  Job:   [id number]

## 2011-06-03 NOTE — Telephone Encounter (Signed)
gv pt appt schedule for feb and cxr appt for 12*14 @ [facility].

## 2011-07-07 ENCOUNTER — Ambulatory Visit (HOSPITAL_COMMUNITY)
Admission: RE | Admit: 2011-07-07 | Discharge: 2011-07-07 | Disposition: A | Discharge status: Home / Self Care | Payer: PRIVATE HEALTH INSURANCE | Source: Ambulatory Visit | Attending: Physician Assistant | Admitting: Physician Assistant

## 2011-07-07 DIAGNOSIS — I10 Essential (primary) hypertension: Secondary | ICD-10-CM | POA: Insufficient documentation

## 2011-07-07 DIAGNOSIS — C50919 Malignant neoplasm of unspecified site of unspecified female breast: Secondary | ICD-10-CM | POA: Insufficient documentation

## 2011-07-09 ENCOUNTER — Other Ambulatory Visit (HOSPITAL_COMMUNITY): Payer: PRIVATE HEALTH INSURANCE

## 2011-09-01 ENCOUNTER — Encounter: Payer: Self-pay | Admitting: Medical Oncology

## 2011-09-03 ENCOUNTER — Ambulatory Visit (HOSPITAL_BASED_OUTPATIENT_CLINIC_OR_DEPARTMENT_OTHER): Payer: PRIVATE HEALTH INSURANCE | Admitting: Oncology

## 2011-09-03 ENCOUNTER — Other Ambulatory Visit: Payer: PRIVATE HEALTH INSURANCE | Admitting: Lab

## 2011-09-03 ENCOUNTER — Encounter: Payer: Self-pay | Admitting: Oncology

## 2011-09-03 VITALS — BP 143/98 | HR 90 | Temp 97.2°F | Ht 61.5 in | Wt 327.5 lb

## 2011-09-03 DIAGNOSIS — C50919 Malignant neoplasm of unspecified site of unspecified female breast: Secondary | ICD-10-CM

## 2011-09-03 DIAGNOSIS — C50411 Malignant neoplasm of upper-outer quadrant of right female breast: Secondary | ICD-10-CM | POA: Insufficient documentation

## 2011-09-03 DIAGNOSIS — C50911 Malignant neoplasm of unspecified site of right female breast: Secondary | ICD-10-CM

## 2011-09-03 LAB — CBC WITH DIFFERENTIAL/PLATELET
BASO%: 0.4 % (ref 0.0–2.0)
EOS%: 0.7 % (ref 0.0–7.0)
Eosinophils Absolute: 0.1 10*3/uL (ref 0.0–0.5)
MCHC: 32.8 g/dL (ref 31.5–36.0)
MCV: 82.8 fL (ref 79.5–101.0)
MONO%: 9.7 % (ref 0.0–14.0)
NEUT#: 7.1 10*3/uL — ABNORMAL HIGH (ref 1.5–6.5)
RBC: 4.61 10*6/uL (ref 3.70–5.45)
RDW: 16.1 % — ABNORMAL HIGH (ref 11.2–14.5)

## 2011-09-03 LAB — COMPREHENSIVE METABOLIC PANEL
ALT: 9 U/L (ref 0–35)
AST: 16 U/L (ref 0–37)
Albumin: 3.9 g/dL (ref 3.5–5.2)
Alkaline Phosphatase: 69 U/L (ref 39–117)
Glucose, Bld: 83 mg/dL (ref 70–99)
Potassium: 4 mEq/L (ref 3.5–5.3)
Sodium: 139 mEq/L (ref 135–145)
Total Protein: 7.2 g/dL (ref 6.0–8.3)

## 2011-09-03 NOTE — Progress Notes (Signed)
This office note has been dictated.  #[id number]

## 2011-09-03 NOTE — Progress Notes (Signed)
CC:   Kelli Abbott, Ph.D., M.D. Kelli Housekeeper, MD Kelli Lint, MD Kelli Coder, MD  PROBLEM LIST: 1. 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  The patient underwent     needle localization and sentinel lymph node biopsy on 05/14/2008.     The primary tumor was low-grade and measured 2 cm.  There was     extensive DCIS, but no lymphovascular invasion.  Margins were     negative after reexcision on 05/30/2008.  Three sentinel lymph     nodes were negative.  Estrogen and progesterone receptors were both     99%.  Proliferative fraction was 10%.  HER-2/neu was 2+, but     negative by CISH. Oncotype DX score came back 14 with an estimated     risk for distant recurrence being 9%.  Tumor stage was T1c N0.     Radiation treatments of the right breast were given from 07/22/2008     through 09/11/2008.  The patient is currently on tamoxifen 20 mg     daily.  This was started in February 2010.  The patient retains her     uterus. 2. Morbid obesity. 3. Chronic pain involving the right hip requiring Percocet following     total right hip replacement in 2005. 4. Hypertension. 5. Osteoarthritis. 6. Urge incontinence. 7. Positive family history of breast cancer involving the patient's     paternal aunts.  MEDICATIONS: 1. Tylenol 650 mg as needed. 2. Vitamin D 1000 mg daily. 3. Celexa 40 mg daily. 4. Percocet 10/325 one every 4 hours as needed, usually 4 a day. 5. VESIcare 5 mg daily. 6. Tamoxifen 10 mg twice daily. 7. Diovan 160 mg twice daily.  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  The patient was last seen here on 03/05/2011.  There have been no changes in her condition.  She feels generally well and is without any complaints.  The patient recently had a  chest x-ray, mammograms and an ultrasound.  She does have regular pelvic exams.  PHYSICAL EXAM:  She is morbidly obese.  Weight is 327 pounds 8 ounces. Height 5 feet 1.5 inches.  Body surface area 2.54 sq m.  Blood pressure 143/98.  Other vital signs are normal.  There is no scleral icterus. Mouth and pharynx are benign.  No peripheral adenopathy palpable.  Heart and lungs:  Normal.  Breasts:  Quite large, fairly symmetric although the right breast may be a little smaller than the left breast with some mild post surgical and radiation changes.  Cosmetic result is excellent. The incision is at about the 9 or 10 o'clock position.  There is some induration in the lower half of the incision site which is curvilinear. No axillary adenopathy.  Left breast is benign.  Abdomen:  Massively obese, nontender with no organomegaly or masses palpable.  Extremities: No peripheral edema or clubbing.  Neurologic:  Grossly normal.  No obvious lymphedema of the right arm.  LABORATORY DATA:  Today, white count 11.1, ANC 7.1, hemoglobin 12.5, hematocrit 38.2, platelets 332,000.  The patient has tended to run a mild leukocytosis which is most likely benign.  Chemistries today are pending.  Chemistries from 06/03/2011 and 03/05/2011 were normal.  IMAGING STUDIES: 1. Digital diagnostic bilateral mammograms  and right breast ultrasound     on 05/13/2011 showed stable post lumpectomy changes in the upper     outer quadrant of the right breast.  There was a stable well-     defined hypoechoic mass at the 10 o'clock position of the right     breast 7 cm from the nipple measuring 4 x 8 x 9 mm.  Ultrasound     showed no change in the small hypoechoic solid mass at the 10     o'clock position. 2. Chest x-ray 2 views carried out on 07/07/2011 showed no acute     disease.  IMPRESSION/PLAN:  Kelli Abbott is now out over 3 years from the time of diagnosis of her cancer of the right breast.  She will continue  on tamoxifen for a total of 5 years.  This will bring her to February of 2015.  There are some recent studies suggesting that 10 years of tamoxifen may be optimal.  We will see whether those studies apply to Kelli Abbott.  She continues to do well without recurrence.  We will plan to see her again in 4 months at which time we will check CBC and chemistries.    ______________________________ Samul Dada, M.D. DSM/MEDQ  D:  09/03/2011  T:  09/03/2011  Job:  [id number]

## 2011-09-08 ENCOUNTER — Telehealth (INDEPENDENT_AMBULATORY_CARE_PROVIDER_SITE_OTHER): Payer: Self-pay | Admitting: General Surgery

## 2011-09-09 ENCOUNTER — Telehealth (INDEPENDENT_AMBULATORY_CARE_PROVIDER_SITE_OTHER): Payer: Self-pay

## 2011-09-09 NOTE — Telephone Encounter (Signed)
Return pt's call today and explained that Dr. Donell Beers would not order labs to check for metal in her bloodstream.  She had a hip replacement and she needs to call her orthopedic physcian and ask them.  Pt agreed.

## 2012-01-03 ENCOUNTER — Encounter: Payer: Self-pay | Admitting: Oncology

## 2012-01-03 ENCOUNTER — Other Ambulatory Visit (HOSPITAL_BASED_OUTPATIENT_CLINIC_OR_DEPARTMENT_OTHER): Payer: PRIVATE HEALTH INSURANCE | Admitting: Lab

## 2012-01-03 ENCOUNTER — Ambulatory Visit (HOSPITAL_BASED_OUTPATIENT_CLINIC_OR_DEPARTMENT_OTHER): Payer: PRIVATE HEALTH INSURANCE | Admitting: Oncology

## 2012-01-03 ENCOUNTER — Telehealth: Payer: Self-pay | Admitting: Oncology

## 2012-01-03 VITALS — BP 129/86 | HR 102 | Temp 97.4°F | Ht 61.5 in | Wt 330.5 lb

## 2012-01-03 DIAGNOSIS — C50911 Malignant neoplasm of unspecified site of right female breast: Secondary | ICD-10-CM

## 2012-01-03 DIAGNOSIS — I10 Essential (primary) hypertension: Secondary | ICD-10-CM

## 2012-01-03 DIAGNOSIS — G8929 Other chronic pain: Secondary | ICD-10-CM

## 2012-01-03 DIAGNOSIS — C50919 Malignant neoplasm of unspecified site of unspecified female breast: Secondary | ICD-10-CM

## 2012-01-03 DIAGNOSIS — M1990 Unspecified osteoarthritis, unspecified site: Secondary | ICD-10-CM

## 2012-01-03 DIAGNOSIS — C50419 Malignant neoplasm of upper-outer quadrant of unspecified female breast: Secondary | ICD-10-CM

## 2012-01-03 LAB — CBC WITH DIFFERENTIAL/PLATELET
BASO%: 0.4 % (ref 0.0–2.0)
LYMPH%: 24 % (ref 14.0–49.7)
MCHC: 31 g/dL — ABNORMAL LOW (ref 31.5–36.0)
MCV: 82.4 fL (ref 79.5–101.0)
MONO#: 0.9 10*3/uL (ref 0.1–0.9)
MONO%: 8.6 % (ref 0.0–14.0)
Platelets: 324 10*3/uL (ref 145–400)
RBC: 4.51 10*6/uL (ref 3.70–5.45)
WBC: 11 10*3/uL — ABNORMAL HIGH (ref 3.9–10.3)

## 2012-01-03 LAB — COMPREHENSIVE METABOLIC PANEL
ALT: 8 U/L (ref 0–35)
Alkaline Phosphatase: 74 U/L (ref 39–117)
Sodium: 140 mEq/L (ref 135–145)
Total Bilirubin: 0.3 mg/dL (ref 0.3–1.2)
Total Protein: 7 g/dL (ref 6.0–8.3)

## 2012-01-03 NOTE — Progress Notes (Signed)
CC:   [name], Ph.D., M.D. Georgann Housekeeper, MD Almond Lint, MD Betsy Coder, MD  PROBLEM LIST:  1. 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 The patient underwent  needle localization and sentinel lymph node biopsy on 05/14/2008.  The primary tumor was low-grade and measured 2 cm. There was  extensive DCIS, but no lymphovascular invasion. Margins were  negative after reexcision on 05/30/2008. Three sentinel lymph  nodes were negative. Estrogen and progesterone receptors were both  99%. Proliferative fraction was 10%. HER-2/neu was 2+, but  negative by CISH. Oncotype DX score came back 14 with an estimated  risk for distant recurrence being 9%. Tumor stage was T1c N0.  Radiation treatments of the right breast were given from 07/22/2008  through 09/11/2008. The patient is currently on tamoxifen 20 mg  daily. This was started in February 2010. The patient retains her  uterus.  2. Morbid obesity.  3. Chronic pain involving the right hip requiring Percocet following  total right hip replacement in 2005.  4. Hypertension.  5. Osteoarthritis.  6. Urge incontinence.  7. Positive family history of breast cancer involving the patient's  paternal aunts.    MEDICATIONS:  1. Tylenol 650 mg as needed.  2. Vitamin D 1000 mg daily.  3. Celexa 40 mg daily.  4. Percocet 10/325 as needed, usually 1 a day currently.  5. VESIcare 5 mg daily.  6. Tamoxifen 10 mg twice daily.  7. Diovan 160 mg twice daily.    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  This was started in mid February 2010.  The patient was last seen by [place] on 09/03/2011.  She states that she is being compliant with her tamoxifen.  She denies any changes in her condition. She tells me that she had pelvic exam by her gynecologist earlier  this year, perhaps January 2012.  She is up-to-date with chest x-ray carried out on 07/07/2011 and mammograms from 05/13/2011.  As stated, the patient feels generally well, has no symptoms to suggest recurrent breast cancer, and denies any changes in her condition.  PHYSICAL EXAM:  The patient is morbidly obese.  Weight is 330.5 pounds, height 5 feet 1-1/2 inches, body surface area 2.55 sq/m.  Blood pressure 129/86.  Other vital signs are normal.  There is no scleral icterus. Mouth and pharynx are benign.  No peripheral adenopathy palpable. Heart/Lungs:  Normal.  Breasts:  Quite large, fairly symmetric although the right breast may be slightly smaller than the left breast.  There are some mild post surgical and radiation changes.  Cosmetic result is excellent.  The patient does have some pain in the region of the incision which is at the 9 or 10 o'clock position.  There is some induration at the incision site which is curvilinear.  No axillary adenopathy.  Left breast is benign.  Abdomen:  Obese, but benign. Extremities:  No peripheral edema or clubbing.  No obvious lymphedema of the right arm.  Neurologic:  Grossly normal.  LABORATORY DATA:  Today, white count 11.0, ANC 7.3, hemoglobin 11.5, hematocrit 37.2, platelets 324,000.  Chemistries today are pending. Chemistries from 09/03/2011 were entirely normal.  IMAGING STUDIES:  1. Digital diagnostic bilateral mammograms and right breast ultrasound  on 05/13/2011 showed stable post lumpectomy changes in the upper  outer quadrant of the  right breast. There was a stable well-  defined hypoechoic mass at the 10 o'clock position of the right  breast 7 cm from the nipple measuring 4 x 8 x 9 mm. Ultrasound  showed no change in the small hypoechoic solid mass at the 10  o'clock position.  2. Chest x-ray 2 views carried out on 07/07/2011 showed no acute  disease.   IMPRESSION AND PLAN:  The patient is now out 3-1/2 years from the time of  diagnosis.  She has been taking tamoxifen for over 3 years and will stay on this for another 2 years.  I doubt that she will derive any significant benefit from staying on tamoxifen another 10 years even though recent studies have suggested some benefit for selective patients taking 10 years of tamoxifen versus 5 years.  The patient is doing well without evidence of recurrence.  We will plan to see her again in 4 months at which time we will check CBC and chemistries.  She will be due for bilateral mammograms and probably a chest x-ray at that time.  I have urged her to continue having yearly pelvic exams in view of the slight risk that tamoxifen can increase the risk for endometrial cancer.    ______________________________ Samul Dada, M.D. DSM/MEDQ  D:  01/03/2012  T:  01/03/2012  Job:  [id number]

## 2012-01-03 NOTE — Progress Notes (Signed)
This office note has been dictated.  #[id number]

## 2012-01-03 NOTE — Telephone Encounter (Signed)
Gave pt appt for October 2013 lab and MD 

## 2012-04-11 ENCOUNTER — Other Ambulatory Visit: Payer: Self-pay | Admitting: Internal Medicine

## 2012-04-11 DIAGNOSIS — Z9889 Other specified postprocedural states: Secondary | ICD-10-CM

## 2012-04-11 DIAGNOSIS — Z853 Personal history of malignant neoplasm of breast: Secondary | ICD-10-CM

## 2012-05-04 ENCOUNTER — Telehealth: Payer: Self-pay | Admitting: Oncology

## 2012-05-04 ENCOUNTER — Encounter: Payer: Self-pay | Admitting: Oncology

## 2012-05-04 ENCOUNTER — Other Ambulatory Visit (HOSPITAL_BASED_OUTPATIENT_CLINIC_OR_DEPARTMENT_OTHER): Payer: PRIVATE HEALTH INSURANCE | Admitting: Lab

## 2012-05-04 ENCOUNTER — Ambulatory Visit (HOSPITAL_BASED_OUTPATIENT_CLINIC_OR_DEPARTMENT_OTHER): Payer: PRIVATE HEALTH INSURANCE | Admitting: Oncology

## 2012-05-04 VITALS — BP 119/76 | HR 89 | Temp 97.7°F | Resp 20 | Ht 61.5 in | Wt 330.8 lb

## 2012-05-04 DIAGNOSIS — C50911 Malignant neoplasm of unspecified site of right female breast: Secondary | ICD-10-CM

## 2012-05-04 DIAGNOSIS — N63 Unspecified lump in unspecified breast: Secondary | ICD-10-CM

## 2012-05-04 DIAGNOSIS — C50419 Malignant neoplasm of upper-outer quadrant of unspecified female breast: Secondary | ICD-10-CM

## 2012-05-04 DIAGNOSIS — M25519 Pain in unspecified shoulder: Secondary | ICD-10-CM

## 2012-05-04 DIAGNOSIS — C50919 Malignant neoplasm of unspecified site of unspecified female breast: Secondary | ICD-10-CM

## 2012-05-04 LAB — CBC WITH DIFFERENTIAL/PLATELET
BASO%: 0.2 % (ref 0.0–2.0)
EOS%: 1.1 % (ref 0.0–7.0)
HCT: 36.9 % (ref 34.8–46.6)
LYMPH%: 27 % (ref 14.0–49.7)
MCH: 26.3 pg (ref 25.1–34.0)
MCHC: 32.2 g/dL (ref 31.5–36.0)
MCV: 81.6 fL (ref 79.5–101.0)
NEUT%: 63.9 % (ref 38.4–76.8)
Platelets: 325 10*3/uL (ref 145–400)

## 2012-05-04 LAB — COMPREHENSIVE METABOLIC PANEL (CC13)
Alkaline Phosphatase: 70 U/L (ref 40–150)
BUN: 16 mg/dL (ref 7.0–26.0)
CO2: 25 mEq/L (ref 22–29)
Creatinine: 0.9 mg/dL (ref 0.6–1.1)
Glucose: 94 mg/dl (ref 70–99)
Sodium: 139 mEq/L (ref 136–145)
Total Bilirubin: 0.3 mg/dL (ref 0.20–1.20)

## 2012-05-04 LAB — LACTATE DEHYDROGENASE (CC13): LDH: 168 U/L (ref 125–220)

## 2012-05-04 NOTE — Progress Notes (Signed)
This office note has been dictated.  #[id number]

## 2012-05-04 NOTE — Telephone Encounter (Signed)
appts made and printed for pt aom °

## 2012-05-04 NOTE — Progress Notes (Signed)
CC:   [name], Ph.D., M.D. Georgann Housekeeper, MD Almond Lint, MD Caralyn Guile. Ethelene Hal, M.D. Betsy Coder, MD  PROBLEM LIST:  1. 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 The patient underwent  needle localization and sentinel lymph node biopsy on 05/14/2008.  The primary tumor was low-grade and measured 2 cm. There was  extensive DCIS, but no lymphovascular invasion. Margins were  negative after reexcision on 05/30/2008. Three sentinel lymph  nodes were negative. Estrogen and progesterone receptors were both  99%. Proliferative fraction was 10%. HER-2/neu was 2+, but  negative by CISH. Oncotype DX score came back 14 with an estimated  risk for distant recurrence being 9%. Tumor stage was T1c N0.  Radiation treatments of the right breast were given from 07/22/2008  through 09/11/2008. The patient is currently on tamoxifen 20 mg  daily. This was started in February 2010. The patient retains her  uterus.  2. Morbid obesity.  3. Chronic pain involving the right hip, which had previously required  Percocet following a total right hip replacement in 2005.  Percocet was stopped in June 2013.  4. Hypertension.  5. Osteoarthritis.  6. Urge incontinence.  7. Positive family history of breast cancer involving the patient's  paternal aunts. 8. Mild intermittent leukocytosis first noted on 06/10/2008. 9. Hypoalbuminemia noted on 05/04/2012 with albumin of 3.2.    MEDICATIONS:  1. Tylenol 650 mg as needed.  2. Vitamin D 1000 mg daily.  3. Celexa 40 mg daily.  4. Ferrous sulfate 325 mg daily.  5. VESIcare 5 mg daily.  6. Tamoxifen 10 mg twice daily, started mid February 2010.  7. Diovan 160 mg twice daily.  Flu shot given on 05/24/2011 and 04/05/2012.  SMOKING HISTORY:  The patient is a former smoker.   HISTORY:  Kelli Abbott was seen today for followup of her stage I invasive carcinoma of the right breast  associated with DCIS.  She is currently on tamoxifen 20 mg daily.  Tamoxifen was started in mid February 2010.  The patient says she is compliant with taking her tamoxifen.  She was last seen by [place] on 01/03/2012.  She had been doing well up until about a month ago when she woke up from sleep in the morning and was having pain and limitation of movement regarding her left upper extremity.  Onset appears to have been overnight.  Her problems with her left upper extremity are stable, no better or no worse according to the patient.  She has pain on moving her arm, particularly with trying to abduct the left arm.  She has no neck pain.  She denies any weakness of the arm.  The main problem is pain.  She has not sought out medical attention for this.  The patient had been taking Percocet for the past several years because of persistent pain in her right hip following hip replacement. Apparently Percocet was discontinued in June 2013 and the patient seems to be getting along fairly well with regard to her right hip.  She denies any other difficulties.  She tells me that she has mammogram scheduled for October 21st and GYN appointment for October 30th.  She got a flu shot a couple of weeks ago from Dr. Donette Larry and he placed her on iron at that time.  PHYSICAL EXAMINATION:  General:  The patient showed little change.  She did not mention anything to me about her shoulder until we got  into the physical exam.  Vital Signs:  Her weight is stable at 330.8 pounds. Height 5 feet 1-1/2 inches, body surface area 2.55 sq m.  Blood pressure 119/76.  Other vital signs are normal.  The patient is interested in weight reduction program.  HEENT:  There is no scleral icterus.  Mouth and pharynx are benign.  Lymph:  There is no peripheral adenopathy palpable.  No axillary adenopathy.  Heart and Lungs:  Normal.  Breasts: Remain large, fairly symmetric, although the right breast may be slightly smaller than the  left breast.  There are some mild postsurgical irradiation changes.  Cosmetic result is excellent.  The patient continues to have some pain in the region of her incision, which is at the 9 or 10 o'clock position of the breast.  She continues to have a nodular-type induration somewhat superficial involving her incisional scar.  This area is somewhat tender.  I do not think there has been any significant change in the exam that I recorded back on June 10th.  The mammogram on 05/13/2011 did show a well-defined hypoechoic mass at the 10 o'clock position of the right breast measuring 4 x 8 x 9 mm.  This could very well be the one I am feeling.  The patient also had an ultrasound on 05/13/2011 that showed no change compared with prior exams of 11/16/2010 and 04/17/2009.  It will be recalled that the patient's diagnosis of breast cancer dates back to October 2009 and thus, the patient is currently 4 years out from the time of diagnosis.  Left breast is benign.  Abdomen:  Obese, benign with no organomegaly or masses palpable.  Extremities:  No peripheral edema or clubbing.  No obvious lymphedema of the right arm.  The patient seems to have pain on manipulation of the left arm, particularly with attempts to lift it or have it abducted passively.  She also was tender to palpation, particularly in her upper arm, but also to some extent in her left lower arm.  Strength seems to be symmetric, although somewhat decreased between both upper extremities.  LABORATORY DATA:  Today white count 12.2, ANC 7.8, hemoglobin 11.9, hematocrit 36.9, platelets 325,000.  Red cell indices are normal.  It should be noted that the patient has had a somewhat fluctuating mild leukocytosis noted in the past, although today's white count of 12.2 is higher than it has been.  The peripheral smear is unremarkable. Chemistries from today notable for an albumin of 3.2.  This also is a new finding.  Albumin on 01/03/2012 was  3.7.  Liver function tests including LDH were normal.  IMAGING STUDIES:  1. Digital diagnostic bilateral mammograms and right breast ultrasound  on 05/13/2011 showed stable post lumpectomy changes in the upper  outer quadrant of the right breast. There was a stable well-  defined hypoechoic mass at the 10 o'clock position of the right  breast 7 cm from the nipple measuring 4 x 8 x 9 mm. Ultrasound  showed no change in the small hypoechoic solid mass at the 10  o'clock position.  2. Chest x-ray 2 views carried out on 07/07/2011 showed no acute  disease.   IMPRESSION AND PLAN:  The patient is now out 4 years from the time of diagnosis.  The patient has been on tamoxifen now since mid February 2010.  The patient has a couple of things of concern today, specifically the persistently palpable superficial mass at the site of her original tumor.  She will be  due for re-evaluation with mammograms on 05/15/2012. The patient is having a lot of pain in the region of her left shoulder. I suspect that this is an arthritic injury, possibly involving the left shoulder, possibly a torn rotator cuff.  I have strongly urged the patient to make an appointment with Dr. Ethelene Hal.  We will, however, go ahead with a bone scan and also the patient is due for a chest x-ray. We will obtain 2-view chest x-ray, PA and lateral, as her last chest x- ray was back in December 2012.  The patient also has a mildly elevated white count.  We will plan to look at the peripheral smear.  Also she has unexplained low albumin today.  The patient has required a nutrition/dietitian consult for weight reduction.  She did have a flu shot a couple of weeks ago, apparently on 04/05/2012.  I have asked that Ms. Lease return in about 4 weeks, at which time we will again check her labs, CBC and chemistries with an LDH.  She can see Norina Buzzard at that time.  We will also want to review the results of her imaging studies, recheck  her labs, specifically the white count and albumin, and be sure the patient has followed up with Dr. Ethelene Hal and to see what imaging studies he has recommended.    ______________________________ Samul Dada, M.D. DSM/MEDQ  D:  05/04/2012  T:  05/04/2012  Job:  [id number]

## 2012-05-08 ENCOUNTER — Encounter: Payer: Self-pay | Admitting: Nutrition

## 2012-05-08 NOTE — Progress Notes (Signed)
Patient called today, October 14, to cancel nutrition appointment on Tuesday, October 15. I returned patient's call however she was not available to talk. I've left a message that she can reschedule anytime.

## 2012-05-09 ENCOUNTER — Encounter: Payer: PRIVATE HEALTH INSURANCE | Admitting: Nutrition

## 2012-05-15 ENCOUNTER — Ambulatory Visit
Admission: RE | Admit: 2012-05-15 | Discharge: 2012-05-15 | Disposition: A | Discharge status: Home / Self Care | Payer: PRIVATE HEALTH INSURANCE | Source: Ambulatory Visit | Attending: Internal Medicine | Admitting: Internal Medicine

## 2012-05-15 DIAGNOSIS — Z9889 Other specified postprocedural states: Secondary | ICD-10-CM

## 2012-05-15 DIAGNOSIS — Z853 Personal history of malignant neoplasm of breast: Secondary | ICD-10-CM

## 2012-05-24 ENCOUNTER — Other Ambulatory Visit: Payer: Self-pay | Admitting: Obstetrics and Gynecology

## 2012-05-24 ENCOUNTER — Other Ambulatory Visit (HOSPITAL_COMMUNITY)
Admission: RE | Admit: 2012-05-24 | Discharge: 2012-05-24 | Disposition: A | Discharge status: Home / Self Care | Payer: PRIVATE HEALTH INSURANCE | Source: Ambulatory Visit | Attending: Obstetrics and Gynecology | Admitting: Obstetrics and Gynecology

## 2012-05-24 DIAGNOSIS — Z1151 Encounter for screening for human papillomavirus (HPV): Secondary | ICD-10-CM | POA: Insufficient documentation

## 2012-05-24 DIAGNOSIS — Z01419 Encounter for gynecological examination (general) (routine) without abnormal findings: Secondary | ICD-10-CM | POA: Insufficient documentation

## 2012-05-24 DIAGNOSIS — Z113 Encounter for screening for infections with a predominantly sexual mode of transmission: Secondary | ICD-10-CM | POA: Insufficient documentation

## 2012-05-29 ENCOUNTER — Encounter (HOSPITAL_COMMUNITY)
Admission: RE | Admit: 2012-05-29 | Discharge: 2012-05-29 | Disposition: A | Discharge status: Home / Self Care | Payer: PRIVATE HEALTH INSURANCE | Source: Ambulatory Visit | Attending: Oncology | Admitting: Oncology

## 2012-05-29 ENCOUNTER — Other Ambulatory Visit (HOSPITAL_BASED_OUTPATIENT_CLINIC_OR_DEPARTMENT_OTHER): Payer: PRIVATE HEALTH INSURANCE | Admitting: Lab

## 2012-05-29 ENCOUNTER — Ambulatory Visit (HOSPITAL_COMMUNITY): Admission: RE | Admit: 2012-05-29 | Payer: PRIVATE HEALTH INSURANCE | Source: Ambulatory Visit

## 2012-05-29 DIAGNOSIS — C50919 Malignant neoplasm of unspecified site of unspecified female breast: Secondary | ICD-10-CM

## 2012-05-29 DIAGNOSIS — C50911 Malignant neoplasm of unspecified site of right female breast: Secondary | ICD-10-CM

## 2012-05-29 DIAGNOSIS — M79609 Pain in unspecified limb: Secondary | ICD-10-CM | POA: Insufficient documentation

## 2012-05-29 LAB — CBC WITH DIFFERENTIAL/PLATELET
Basophils Absolute: 0 10*3/uL (ref 0.0–0.1)
EOS%: 1.1 % (ref 0.0–7.0)
HCT: 36.6 % (ref 34.8–46.6)
HGB: 12.1 g/dL (ref 11.6–15.9)
MCH: 27.4 pg (ref 25.1–34.0)
MCV: 83.1 fL (ref 79.5–101.0)
MONO%: 8.9 % (ref 0.0–14.0)
NEUT%: 64.4 % (ref 38.4–76.8)
Platelets: 288 10*3/uL (ref 145–400)

## 2012-05-29 LAB — COMPREHENSIVE METABOLIC PANEL (CC13)
AST: 11 U/L (ref 5–34)
BUN: 12 mg/dL (ref 7.0–26.0)
Calcium: 10.2 mg/dL (ref 8.4–10.4)
Chloride: 106 mEq/L (ref 98–107)
Creatinine: 0.8 mg/dL (ref 0.6–1.1)

## 2012-05-29 MED ORDER — TECHNETIUM TC 99M MEDRONATE IV KIT
25.0000 | PACK | Freq: Once | INTRAVENOUS | Status: AC | PRN
  Administered 2012-05-29: 25 via INTRAVENOUS

## 2012-05-30 ENCOUNTER — Telehealth: Payer: Self-pay | Admitting: Medical Oncology

## 2012-05-30 NOTE — Telephone Encounter (Signed)
I called pt per Dr. Arline Asp to let her know that her bone scan was negative. She asked does Dr. Arline Asp think the pain is her rotator cuff. I told her she can discuss with him at her 06/01/12 appointment. She voiced understanding.

## 2012-06-01 ENCOUNTER — Encounter: Payer: Self-pay | Admitting: Family

## 2012-06-01 ENCOUNTER — Ambulatory Visit (HOSPITAL_COMMUNITY)
Admission: RE | Admit: 2012-06-01 | Discharge: 2012-06-01 | Disposition: A | Discharge status: Home / Self Care | Payer: PRIVATE HEALTH INSURANCE | Source: Ambulatory Visit | Attending: Family | Admitting: Family

## 2012-06-01 ENCOUNTER — Ambulatory Visit (HOSPITAL_BASED_OUTPATIENT_CLINIC_OR_DEPARTMENT_OTHER): Payer: PRIVATE HEALTH INSURANCE | Admitting: Family

## 2012-06-01 ENCOUNTER — Telehealth: Payer: Self-pay | Admitting: Oncology

## 2012-06-01 VITALS — BP 120/83 | HR 88 | Temp 97.8°F | Resp 20 | Ht 61.5 in | Wt 327.0 lb

## 2012-06-01 DIAGNOSIS — C50911 Malignant neoplasm of unspecified site of right female breast: Secondary | ICD-10-CM

## 2012-06-01 DIAGNOSIS — Z170 Estrogen receptor positive status [ER+]: Secondary | ICD-10-CM

## 2012-06-01 DIAGNOSIS — M25519 Pain in unspecified shoulder: Secondary | ICD-10-CM | POA: Insufficient documentation

## 2012-06-01 DIAGNOSIS — I10 Essential (primary) hypertension: Secondary | ICD-10-CM | POA: Insufficient documentation

## 2012-06-01 DIAGNOSIS — C50919 Malignant neoplasm of unspecified site of unspecified female breast: Secondary | ICD-10-CM | POA: Insufficient documentation

## 2012-06-01 DIAGNOSIS — C50419 Malignant neoplasm of upper-outer quadrant of unspecified female breast: Secondary | ICD-10-CM

## 2012-06-01 DIAGNOSIS — Z87891 Personal history of nicotine dependence: Secondary | ICD-10-CM | POA: Insufficient documentation

## 2012-06-01 DIAGNOSIS — M1990 Unspecified osteoarthritis, unspecified site: Secondary | ICD-10-CM

## 2012-06-01 DIAGNOSIS — M79609 Pain in unspecified limb: Secondary | ICD-10-CM

## 2012-06-01 MED ORDER — HYDROCODONE-ACETAMINOPHEN 10-325 MG PO TABS: 1.0000 | ORAL_TABLET | Freq: Two times a day (BID) | ORAL | Status: DC

## 2012-06-01 NOTE — Patient Instructions (Addendum)
Please schedule appointment with Dr. Ethelene Hal as soon as possible. Please contact us if you have any questions or concerns.

## 2012-06-01 NOTE — Telephone Encounter (Signed)
Pt sent to rad for cxr and given schedule for January 2014.

## 2012-06-01 NOTE — Progress Notes (Signed)
Patient ID: Kelli Abbott, female   DOB: Jul 12, 1956, 56 y.o.   MRN: [id number] CSN: [id number]  CC: [name], Ph.D., MD  Georgann Housekeeper, MD  Almond Lint, MD  Caralyn Guile. Ethelene Hal, MD  Betsy Coder, MD  Problem List: Kelli Abbott is a 56 y.o. African-American female with a problem list consisting of:  1. 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 The patient underwent needle localization and sentinel lymph node biopsy on 05/14/2008. The primary tumor was low-grade and measured 2 cm. 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 Margins were negative after reexcision on 05/30/2008. Three sentinel lymph nodes were negative. Estrogen and progesterone receptors were both 99%. Proliferative fraction was 10%. HER-2/neu was 2+, but negative by CISH. Oncotype DX score came back 14 with an estimated risk for distant recurrence being 9%. Tumor stage was T1c N0. Radiation treatments of the right breast were given from 07/22/2008  through 09/11/2008. The patient is currently on tamoxifen 20 mg daily. This was started in February 2010. The patient retains her uterus.  2. Morbid obesity 3. Chronic pain involving the right hip, which had previously required Percocet following a total right hip replacement in 2005. Percocet was stopped in June 2013.  4. Hypertension  5. Osteoarthritis 6. Urge incontinence  7. Positive family history of breast cancer involving the patient's paternal aunts.  8. Mild intermittent leukocytosis first noted on 06/10/2008.  9. Hypoalbuminemia noted on 05/04/2012 with albumin of 3.2.   Flu shot given on 05/24/2011 and 04/05/2012.  Kelli Abbott was seen today by Dr. Arline Asp and I for follow up of her stage I invasive carcinoma of the right breast associated with DCIS. She is currently on tamoxifen 20 mg daily. Tamoxifen was started in mid February 2010. The patient says she is compliant with taking  her tamoxifen. She was last seen by [place] on 05/04/2012. Since her last office visit she has had ongoing and continuous pain and limitation of movement of her bilateral upper extremities. She has pain on moving her arms, particularly with trying to abduct or extend both of her arms. She does not have any neck pain. She has not sought out medical attention for this as was instructed by Dr. Arline Asp during her last office visit. The patient had been taking Percocet for the past several years because of persistent pain in her right hip following hip replacement. Apparently Percocet was discontinued in June 2013 and the patient seems to be getting along fairly well with regard to her right hip. She denies any other difficulties or symptomatology.  A mammogram on 05/15/2012 and a bone scan on 05/29/2012 were both negative for malignancy.  The results of both diagnostic tests were discussed with Kelli Abbott. The patient had a GYN appointment on 05/24/2012 and her pap results were negative for malignancy. She received the influenza vaccination on 04/05/2012 from Dr. Donette Larry who also placed her on oral ferrous sulfate 325 mg daily at that time.  Ms. Besecker requested and received medication for her bilateral upper extremity pain until she is able to see Dr. Ethelene Hal.  A prescription for Norco 10/325 mg Q12  #60 was written for the patient. The patient denies any fever, chills, unusual bleeding, breast changes, N/V/D or constipation.  Past Medical History: Past Medical History  Diagnosis Date  . Breast lump   . Hypertension   . Skin lesions, generalized   . Breast nodule  hypoechoic in right breast  . Cancer     breast  . Breast cancer   . Arthritis     Osteoarthritis    Surgical History: Past Surgical History  Procedure Date  . Breast surgery 2009    R breast reexcision of margins  . Total hip arthroplasty     right    Current Medications: Current Outpatient Prescriptions  Medication Sig Dispense Refill   . acetaminophen (TYLENOL) 325 MG tablet Take 650 mg by mouth every 6 (six) hours as needed.        . Cholecalciferol (VITAMIN D PO) Take 1,000 mg by mouth daily.        . citalopram (CELEXA) 40 MG tablet Take 40 mg by mouth daily.        . ferrous sulfate 325 (65 FE) MG tablet Take 325 mg by mouth daily with breakfast.      . solifenacin (VESICARE) 5 MG tablet Take 5 mg by mouth daily.       . tamoxifen (NOLVADEX) 10 MG tablet Take 10 mg by mouth 2 (two) times daily.        . valsartan (DIOVAN) 160 MG tablet Take 160 mg by mouth 2 (two) times daily.        Marland Kitchen HYDROcodone-acetaminophen (NORCO) 10-325 MG per tablet Take 1 tablet by mouth every 12 (twelve) hours.  60 tablet  0    Allergies: No Known Allergies  Family History: Family History  Problem Relation Age of Onset  . Hypertension Mother   . Hypertension Father   . Hypertension Brother   . Cancer Paternal Aunt     Social History: History  Substance Use Topics  . Smoking status: Former Smoker -- 1.0 packs/day for 20 years    Types: Cigarettes    Quit date: 04/26/2008  . Smokeless tobacco: Never Used  . Alcohol Use: Yes     Comment: less than 1/wk    Review of Systems: 10 Point review of systems was completed and is negative except as noted above.   Physical Exam:   Blood pressure 120/83, pulse 88, temperature 97.8 F (36.6 C), temperature source Oral, resp. rate 20, height 5' 1.5" (1.562 m), weight 327 lb (148.326 kg).  General appearance: Alert, cooperative, well nourished, obese, moderate distress due to bilateral upper extremity pain Head: Normocephalic, without obvious abnormality, atraumatic Eyes: Conjunctivae/corneas clear, PERRLA, EOMI Nose: Nares, septum and mucosa are normal, no drainage or sinus tenderness Neck: No adenopathy, excessive habitus, supple, symmetrical, trachea midline, thyroid not enlarged, no tenderness Resp: Clear to auscultation bilaterally, diminished bibasilar breath sounds Breasts:  Pendulous, right breast is smaller than the left breast, mass at the 10 o'clock position of the right breast, nodular-type induration involving her incisional scar, this area is also tender, visible radiation changes to right breast, well healed surgical scars. Cardio: Regular rate and rhythm, S1, S2 normal, no murmur, click, rub or gallop GI: Soft, excessive habitus, distended, non-tender, hypoactive bowel sounds, Extremities: Bilateral UE extremities have limited ROM and are tender, all other extremities are normal, atraumatic, no cyanosis or edema Lymph nodes: Cervical, supraclavicular, and axillary nodes normal Neurologic: Grossly normal   Laboratory Data: Recent Results (from the past 168 hour(s))  CBC WITH DIFFERENTIAL   Collection Time   05/29/12  8:29 AM      Component Value Range   WBC 10.3  3.9 - 10.3 10e3/uL   NEUT# 6.6 (*) 1.5 - 6.5 10e3/uL   HGB 12.1  11.6 - 15.9 g/dL  HCT 36.6  34.8 - 46.6 %   Platelets 288  145 - 400 10e3/uL   MCV 83.1  79.5 - 101.0 fL   MCH 27.4  25.1 - 34.0 pg   MCHC 33.0  31.5 - 36.0 g/dL   RBC 4.78  2.95 - 6.21 10e6/uL   RDW 17.4 (*) 11.2 - 14.5 %   lymph# 2.6  0.9 - 3.3 10e3/uL   MONO# 0.9  0.1 - 0.9 10e3/uL   Eosinophils Absolute 0.1  0.0 - 0.5 10e3/uL   Basophils Absolute 0.0  0.0 - 0.1 10e3/uL   NEUT% 64.4  38.4 - 76.8 %   LYMPH% 25.2  14.0 - 49.7 %   MONO% 8.9  0.0 - 14.0 %   EOS% 1.1  0.0 - 7.0 %   BASO% 0.4  0.0 - 2.0 %  COMPREHENSIVE METABOLIC PANEL (CC13)   Collection Time   05/29/12  8:29 AM      Component Value Range   Sodium 140  136 - 145 mEq/L   Potassium 3.8  3.5 - 5.1 mEq/L   Chloride 106  98 - 107 mEq/L   CO2 26  22 - 29 mEq/L   Glucose 98  70 - 99 mg/dl   BUN 30.8  7.0 - 65.7 mg/dL   Creatinine 0.8  0.6 - 1.1 mg/dL   Total Bilirubin 8.46  0.20 - 1.20 mg/dL   Alkaline Phosphatase 70  40 - 150 U/L   AST 11  5 - 34 U/L   ALT 8  0 - 55 U/L   Total Protein 7.7  6.4 - 8.3 g/dL   Albumin 3.4 (*) 3.5 - 5.0 g/dL   Calcium  96.2  8.4 - 10.4 mg/dL  LACTATE DEHYDROGENASE (CC13)   Collection Time   05/29/12  8:29 AM      Component Value Range   LDH 186  125 - 220 U/L    Imaging Studies: 1. Digital diagnostic bilateral mammograms and right breast ultrasound on 05/13/2011 showed stable post lumpectomy changes in the upper outer quadrant of the right breast. There was a stable well-defined hypoechoic mass at the 10 o'clock position of the right breast 7 cm from the nipple measuring 4 x 8 x 9 mm. Ultrasound showed no change in the small hypoechoic solid mass at the 10 o'clock position.  2. Chest x-ray 2 views carried out on 07/07/2011 showed no acute disease. 3. Digital diagnostic bilateral mammogram on 05/15/2012 showed no evidence for malignancy.  Expected right lumpectomy changes are noted.  4. Nuclear whole body bone scan on 05/29/2012 showed no areas of abnormal bony uptake to suggest bony metastatic disease.  Increased activity in the knees and feet, likely degenerative.  No evidence of bony metastatic disease.     Impression/Plan: Ms. Keatley is now out 4 years from the time of diagnosis. The patient has been on tamoxifen now since mid February 2010.  The patient continues to have a lot of bilateral upper extremity pain. Dr. Arline Asp suspects that this is an arthritic injury, possibly involving the shoulders possibly torn rotator cuffs.  Again he strongly urged the patient to make an appointment with Dr. Ethelene Hal. We will obtain 2-view chest x-ray today, as her last chest x-ray was in December 2012. The patient's mildly elevated white count has returned to normal but her unexplained low albumin persists.   Ms. Archibald will reschedule her appointment with the nutrition/dietitian for weight reduction.    Ms. Montalban return in 2 months',  at which time we will again check her labs, CBC and chemistries with an LDH. Chest x-ray results are pending at this time. We ask that the patient follow up with Dr. Ethelene Hal today to see what  imaging studies he recommends.  Ms. Cederberg is asked to contact us in the interim if she has any questions or concerns.     Larina Bras, NP-C 06/01/2012, 8:52 AM

## 2012-06-02 ENCOUNTER — Telehealth: Payer: Self-pay

## 2012-06-02 NOTE — Telephone Encounter (Signed)
S/w pt that CXR from 11/7 was OK per Dr Arline Asp

## 2012-06-24 ENCOUNTER — Telehealth: Payer: Self-pay | Admitting: Oncology

## 2012-06-24 NOTE — Telephone Encounter (Signed)
Med Onc on call  Patient called requesting pain medication for discomfort which is not related to cancer history, per bone scan and CXR by her oncologist reviewed in EMR now; we also do not refill pain medication on weekends in this fashion.  I suggested she call orthopedist, PCP or be seen at ED or urgent care today if needed.  Ila Mcgill, MD

## 2012-06-27 ENCOUNTER — Other Ambulatory Visit: Payer: Self-pay | Admitting: Medical Oncology

## 2012-06-27 ENCOUNTER — Telehealth: Payer: Self-pay | Admitting: Medical Oncology

## 2012-06-27 DIAGNOSIS — C50911 Malignant neoplasm of unspecified site of right female breast: Secondary | ICD-10-CM

## 2012-06-27 MED ORDER — HYDROCODONE-ACETAMINOPHEN 10-325 MG PO TABS: 1.0000 | ORAL_TABLET | Freq: Three times a day (TID) | ORAL | Status: DC | PRN

## 2012-06-27 NOTE — Telephone Encounter (Signed)
Pt called yesterday to let us know that she has not been able to get an appointment with her orthopedic MD. She states she owes him two co-pays and he will not see her until she pays this bill. Dr. Arline Asp had given her Norco at her last visit for the pain. She states that she took it every 12 hours but it did not last. She called the pharmacist and he told her she can take every 8hrs. She states that the percocet she took worked better and asked if Dr. Arline Asp will give her this for pain. Per Dr. Arline Asp he is treating her for breast cancer not her shoulder pain. He will refill her Norco but she needs to see her orthopedic MD for her shoulder and pain medication. She voiced understanding.

## 2012-07-13 ENCOUNTER — Other Ambulatory Visit: Payer: Self-pay | Admitting: Obstetrics and Gynecology

## 2012-07-31 ENCOUNTER — Ambulatory Visit: Admit: 2012-07-31 | Payer: Self-pay | Admitting: Obstetrics and Gynecology

## 2012-07-31 SURGERY — DILATATION & CURETTAGE/HYSTEROSCOPY WITH TRUCLEAR
Anesthesia: Choice

## 2012-08-03 ENCOUNTER — Encounter: Payer: Self-pay | Admitting: Oncology

## 2012-08-03 ENCOUNTER — Other Ambulatory Visit (HOSPITAL_BASED_OUTPATIENT_CLINIC_OR_DEPARTMENT_OTHER): Payer: PRIVATE HEALTH INSURANCE | Admitting: Lab

## 2012-08-03 ENCOUNTER — Ambulatory Visit (HOSPITAL_BASED_OUTPATIENT_CLINIC_OR_DEPARTMENT_OTHER): Payer: PRIVATE HEALTH INSURANCE | Admitting: Oncology

## 2012-08-03 ENCOUNTER — Ambulatory Visit (HOSPITAL_BASED_OUTPATIENT_CLINIC_OR_DEPARTMENT_OTHER): Payer: PRIVATE HEALTH INSURANCE | Admitting: Lab

## 2012-08-03 ENCOUNTER — Telehealth: Payer: Self-pay | Admitting: Oncology

## 2012-08-03 VITALS — BP 142/97 | HR 82 | Temp 98.1°F | Resp 20 | Ht 61.5 in | Wt 325.4 lb

## 2012-08-03 DIAGNOSIS — C50419 Malignant neoplasm of upper-outer quadrant of unspecified female breast: Secondary | ICD-10-CM

## 2012-08-03 DIAGNOSIS — R799 Abnormal finding of blood chemistry, unspecified: Secondary | ICD-10-CM

## 2012-08-03 DIAGNOSIS — M25519 Pain in unspecified shoulder: Secondary | ICD-10-CM

## 2012-08-03 DIAGNOSIS — E8809 Other disorders of plasma-protein metabolism, not elsewhere classified: Secondary | ICD-10-CM | POA: Insufficient documentation

## 2012-08-03 DIAGNOSIS — C50911 Malignant neoplasm of unspecified site of right female breast: Secondary | ICD-10-CM

## 2012-08-03 LAB — COMPREHENSIVE METABOLIC PANEL (CC13)
Albumin: 3.1 g/dL — ABNORMAL LOW (ref 3.5–5.0)
BUN: 13 mg/dL (ref 7.0–26.0)
CO2: 28 mEq/L (ref 22–29)
Calcium: 9.8 mg/dL (ref 8.4–10.4)
Chloride: 104 mEq/L (ref 98–107)
Glucose: 98 mg/dl (ref 70–99)
Potassium: 3.8 mEq/L (ref 3.5–5.1)

## 2012-08-03 LAB — CBC WITH DIFFERENTIAL/PLATELET
Basophils Absolute: 0 10*3/uL (ref 0.0–0.1)
Eosinophils Absolute: 0.1 10*3/uL (ref 0.0–0.5)
HGB: 11.5 g/dL — ABNORMAL LOW (ref 11.6–15.9)
MCV: 83.9 fL (ref 79.5–101.0)
MONO#: 0.9 10*3/uL (ref 0.1–0.9)
MONO%: 9.2 % (ref 0.0–14.0)
NEUT#: 6.4 10*3/uL (ref 1.5–6.5)
RDW: 16.3 % — ABNORMAL HIGH (ref 11.2–14.5)
lymph#: 2.7 10*3/uL (ref 0.9–3.3)

## 2012-08-03 LAB — URINALYSIS, MICROSCOPIC - CHCC
Bilirubin (Urine): NEGATIVE
Glucose: NEGATIVE g/dL
Leukocyte Esterase: NEGATIVE
Nitrite: NEGATIVE

## 2012-08-03 LAB — LACTATE DEHYDROGENASE (CC13): LDH: 172 U/L (ref 125–245)

## 2012-08-03 NOTE — Progress Notes (Signed)
This office note has been dictated.  #[id number]

## 2012-08-03 NOTE — Telephone Encounter (Signed)
gv and pritned pt appt schedule for March...I sent the Rx to the pharmacy. Pt back to the lab

## 2012-08-04 ENCOUNTER — Telehealth: Payer: Self-pay

## 2012-08-04 NOTE — Progress Notes (Signed)
CC:   [name], Ph.D., M.D. Georgann Housekeeper, MD Almond Lint, MD Caralyn Guile. Ethelene Hal, M.D. Betsy Coder, MD Pieter Partridge, MD  PROBLEM LIST:  1. 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 The patient underwent  needle localization and sentinel lymph node biopsy on 05/14/2008.  The primary tumor was low-grade and measured 2 cm. There was  extensive DCIS, but no lymphovascular invasion. Margins were  negative after reexcision on 05/30/2008. Three sentinel lymph  nodes were negative. Estrogen and progesterone receptors were both  99%. Proliferative fraction was 10%. HER-2/neu was 2+, but  negative by CISH. Oncotype DX score came back 14 with an estimated  risk for distant recurrence being 9%. Tumor stage was T1c N0.  Radiation treatments of the right breast were given from 07/22/2008  through 09/11/2008. The patient is currently on tamoxifen 20 mg  daily. This was started in February 2010. The patient retains her  uterus.  2. Morbid obesity.  3. Chronic pain involving the right hip, which had previously required  Percocet following a total right hip replacement in 2005. Percocet was  stopped in June 2013.  4. Hypertension.  5. Osteoarthritis.  6. Urge incontinence.  7. Positive family history of breast cancer involving the patient's  paternal aunts.  8. Mild intermittent leukocytosis first noted on 06/10/2008.  9. Hypoalbuminemia noted on 05/04/2012 with albumin of 3.2.  10. Episode of vaginal bleeding which occurred in September 2013 apparently lasting 5 days.  It was planned that patient was going to have a D and C by Dr. Geryl Rankins, however, the patient canceled this.  11. Pain and limitation of movement involving both shoulders.    MEDICATIONS:  Reviewed and recorded.  Current Outpatient Prescriptions  Medication Sig Dispense Refill  . acetaminophen (TYLENOL) 325 MG tablet Take 650 mg by  mouth every 6 (six) hours as needed.        . Cholecalciferol (VITAMIN D PO) Take 1,000 mg by mouth daily.        . citalopram (CELEXA) 40 MG tablet Take 40 mg by mouth daily.        . ferrous sulfate 325 (65 FE) MG tablet Take 325 mg by mouth daily with breakfast.      . HYDROcodone-acetaminophen (NORCO) 10-325 MG per tablet Take 1 tablet by mouth every 8 (eight) hours as needed for pain.  60 tablet  0  . solifenacin (VESICARE) 5 MG tablet Take 5 mg by mouth daily.       . tamoxifen (NOLVADEX) 10 MG tablet Take 10 mg by mouth 2 (two) times daily.        . valsartan (DIOVAN) 160 MG tablet Take 160 mg by mouth 2 (two) times daily.         The patient is currently on tamoxifen 10 mg twice daily.  This was started in mid February 2010.     Flu shot given on 05/24/2011 and 04/05/2012.   SMOKING HISTORY: The patient is a former smoker.   HISTORY:  I saw Kelli Abbott today for followup of her stage I invasive carcinoma of the right breast associated with DCIS.  The patient is currently on tamoxifen 20 mg daily.  The patient's main problem continues to be pain and limitation of movement involving both shoulders.  The patient apparently is unable to make an appointment with an orthopedic doctor because of unpaid medical bills.  The patient tells me that she  had some vaginal bleeding, which occurred in September.  It lasted about 5 days.  I do not think she had mentioned this to [place] on 2 earlier visits when she was last seen here on 06/01/2012 and 05/04/2012. Apparently the patient saw Dr. Geryl Rankins.  She was suppose to undergo a D and C, but the patient apparently canceled this.  She does have followup with Dr. Dion Body in February.  PHYSICAL EXAMINATION:  There is little change.  The patient seems somewhat uncomfortable with regard to her shoulders more the right than the left at this time, although previously she was having more problems with the left shoulder.  Weight today is 325.4  pounds.  Height 5 feet 1- 1/2 inches.  Body surface area 2.53 m2.  Blood pressure 142/97.  Other vital signs are normal.  There is no scleral icterus.  Mouth and pharynx are benign.  There is no peripheral adenopathy palpable.  Heart and lungs are normal.  Breasts remain with the right breast being a little smaller than the left breast.  Both breasts are quite large.  There is some mild postsurgical and radiation changes.  Cosmetic result is excellent.  There is some nodular type induration involving her scar in the upper outer quadrant.  This seems to be stable.  The left breast is benign.  No dimpling or nipple inversion.  The abdomen with the patient sitting is quite obese.  Extremities:  No peripheral edema.  No Port-A- Cath or central catheter.  No obvious lymphedema involving the right arm.  LABORATORY DATA:  Today white count 10.3, ANC 6.4, hemoglobin 11.5, hematocrit 34.9, platelets 313,000.  Chemistries notable today for an albumin of 3.1 as compared with 3.4 on 05/29/2012, 3.2 on 05/04/2012 and 3.7 on 01/03/2012.  Reasons for this patient's hypoalbuminemia are unclear.  We are checking a urinalysis today.  I should mention liver function tests appear to be normal.  Imaging Studies:  1. Digital diagnostic bilateral mammograms and right breast ultrasound on 05/13/2011 showed stable post lumpectomy changes in the upper outer quadrant of the right breast. There was a stable well-defined hypoechoic mass at the 10 o'clock position of the right breast 7 cm from the nipple measuring 4 x 8 x 9 mm. Ultrasound showed no change in the small hypoechoic solid mass at the 10 o'clock position.  2. Chest x-ray 2 views carried out on 07/07/2011 showed no acute disease.  3. Digital diagnostic bilateral mammogram on 05/15/2012 showed no evidence for malignancy. Expected right lumpectomy changes are noted.  4. Nuclear whole body bone scan on 05/29/2012 showed no areas of abnormal bony uptake to suggest  bony metastatic disease. Increased activity in the knees and feet, likely degenerative. No evidence of bony metastatic disease.  5.Chest x-ray 2-view on 06/01/2012 showed stable cardiopulmonary appearance with underlying bronchitic change.  No new focal or abnormal or acute abnormality was seen.   IMPRESSION AND PLAN:  The patient is now approaching 4 years of tamoxifen therapy.  I am concerned about the history of bleeding that occurred in September.  I am learning about this for the first time.  We will try to obtain some additional information from Dr. Geryl Rankins, the patient's gynecologist.  The patient does retain her uterus.  She has had no further bleeding thus far.  I have requested that she notify us if that occurs.  I am considering whether we should discontinue her tamoxifen at this time particularly if she is not able to undergo a  biopsy or a D and C to rule out the possibility of endometrial cancer.  I am concerned about the low albumin.  We are going to check a urinalysis today for protein.  We may forward this on to Dr. Venita Sheffield office.  The patient may need a 24-hour urine collection and if she does have significant proteinuria she may need a Renal consultation.  It will be recalled that we did carry out a bone scan on 05/29/2012. This did not show any evidence for metastatic disease.  I believe the patient's bilateral shoulder region pain is either due to arthritis or rotator cuff disease.  Unfortunately the patient has some financial obligations and may not be able to go back to the same Orthopedic doctor that she has seen in the past.  She has been taking hydrocodone and acetaminophen for pain.  I have suggested that she may need to obtain pain medicine from Dr. Donette Larry as we do not think that the pain that she is having is related to cancer.    ______________________________ Samul Dada, M.D. DSM/MEDQ  D:  08/03/2012  T:  08/04/2012  Job:  [id number]

## 2012-08-04 NOTE — Telephone Encounter (Signed)
Faxed labs from 08/03/12 to Dr Donette Larry per Dr Arline Asp

## 2012-09-18 ENCOUNTER — Other Ambulatory Visit: Payer: Self-pay | Admitting: Medical Oncology

## 2012-09-19 ENCOUNTER — Other Ambulatory Visit: Payer: Self-pay | Admitting: Medical Oncology

## 2012-09-19 MED ORDER — TAMOXIFEN CITRATE 10 MG PO TABS: 10.0000 mg | ORAL_TABLET | Freq: Two times a day (BID) | ORAL | Status: DC

## 2012-09-26 ENCOUNTER — Telehealth: Payer: Self-pay | Admitting: Oncology

## 2012-09-26 NOTE — Telephone Encounter (Signed)
Moved 3/6 appt from [facility] to [facility] on 3/11. S/w pt she is aware.

## 2012-09-28 ENCOUNTER — Other Ambulatory Visit: Payer: PRIVATE HEALTH INSURANCE | Admitting: Lab

## 2012-09-28 ENCOUNTER — Ambulatory Visit: Payer: PRIVATE HEALTH INSURANCE | Admitting: Family

## 2012-10-03 ENCOUNTER — Telehealth: Payer: Self-pay | Admitting: Oncology

## 2012-10-03 ENCOUNTER — Ambulatory Visit (HOSPITAL_BASED_OUTPATIENT_CLINIC_OR_DEPARTMENT_OTHER): Payer: PRIVATE HEALTH INSURANCE | Admitting: Physician Assistant

## 2012-10-03 ENCOUNTER — Other Ambulatory Visit (HOSPITAL_BASED_OUTPATIENT_CLINIC_OR_DEPARTMENT_OTHER): Payer: PRIVATE HEALTH INSURANCE | Admitting: Lab

## 2012-10-03 ENCOUNTER — Other Ambulatory Visit: Payer: Self-pay | Admitting: Physician Assistant

## 2012-10-03 VITALS — BP 132/83 | HR 84 | Temp 97.3°F | Resp 22 | Ht 61.5 in | Wt 328.5 lb

## 2012-10-03 DIAGNOSIS — R52 Pain, unspecified: Secondary | ICD-10-CM

## 2012-10-03 DIAGNOSIS — C50419 Malignant neoplasm of upper-outer quadrant of unspecified female breast: Secondary | ICD-10-CM

## 2012-10-03 DIAGNOSIS — Z170 Estrogen receptor positive status [ER+]: Secondary | ICD-10-CM

## 2012-10-03 DIAGNOSIS — E8809 Other disorders of plasma-protein metabolism, not elsewhere classified: Secondary | ICD-10-CM

## 2012-10-03 DIAGNOSIS — C50911 Malignant neoplasm of unspecified site of right female breast: Secondary | ICD-10-CM

## 2012-10-03 LAB — LACTATE DEHYDROGENASE (CC13): LDH: 194 U/L (ref 125–245)

## 2012-10-03 LAB — COMPREHENSIVE METABOLIC PANEL (CC13)
ALT: 8 U/L (ref 0–55)
AST: 12 U/L (ref 5–34)
BUN: 13.5 mg/dL (ref 7.0–26.0)
Creatinine: 0.9 mg/dL (ref 0.6–1.1)
Total Bilirubin: 0.37 mg/dL (ref 0.20–1.20)

## 2012-10-03 NOTE — Telephone Encounter (Signed)
[facility] AN DPRINTED APPT SCHEDULE FOR PT FOR jUNE...PT WANTS WATER BASED.Marland Kitchen

## 2012-10-03 NOTE — Progress Notes (Signed)
[facility]  [phone]   CC: Kelli Abbott, Ph.D., M.D.  Georgann Housekeeper, MD  Almond Lint, MD  Caralyn Guile. Ethelene Hal, M.D.  Betsy Coder, MD  Pieter Partridge, MD     OFFICE PROGRESS NOTE  PROBLEM LIST:  1. 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 The patient underwent needle localization and sentinel lymph node biopsy on 05/14/2008. The primary tumor was low-grade and measured 2 cm. 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 Margins were negative after reexcision on 05/30/2008. Three sentinel lymph nodes were negative. Estrogen and progesterone receptors were both 99%. Proliferative fraction was 10%. HER-2/neu was 2+, but negative by CISH. Oncotype DX score came back 14 with an estimated risk for distant recurrence being 9%. Tumor stage was T1c N0. Radiation treatments of the right breast were given from 07/22/2008 through 09/11/2008. The patient is currently on tamoxifen 20 mg daily. This was started in February 2010. The patient retains her uterus.  2. Morbid obesity.  3. Chronic pain involving the right hip, which had previously required Percocet following a total right hip replacement in 2005. Percocet was stopped in June 2013.  4. Hypertension.  5. Osteoarthritis -hips.  6. Urge incontinence.  7. Positive family history of breast cancer involving the patient's paternal aunts.  8. Mild intermittent leukocytosis first noted on 06/10/2008.  9. Hypoalbuminemia noted on 05/04/2012 with albumin of 3.2. As of 08/03/12 was 3.1 10. Episode of vaginal bleeding which occurred in September 2013 apparently lasting 5 days. It was planned that patient was going to have a  D and C by Dr. Geryl Rankins, however, the patient canceled this.  11. Pain and limitation of movement involving both shoulders. 12. Anxiety 13. Depression 14. History of anemia 15. Obesity   MEDICATIONS: Current  outpatient prescriptions:acetaminophen (TYLENOL) 325 MG tablet, Take 650 mg by mouth every 6 (six) hours as needed.   Cholecalciferol (VITAMIN D PO), Take 1,000 mg by mouth daily.  citalopram (CELEXA) 40 MG tablet, Take 40 mg by mouth daily.  ferrous sulfate 325 (65 FE) MG tablet, Take 325 mg by mouth daily with breakfast HYDROcodone-acetaminophen (NORCO) 10-325 MG per tablet, Take 1 tablet by mouth every 8 (eight) hours as needed for pain., oxyCODONE-acetaminophen (PERCOCET/ROXICET) 5-325 MG per tablet, solifenacin (VESICARE) 5 MG tablet, Take 5 mg by mouth daily. valsartan (DIOVAN) 160 MG tablet, Take 160 mg by mouth 2 (two) times daily.  valsartan-hydrochlorothiazide (DIOVAN-HCT) 320-25 MG per tablet,  The patient is currently ontamoxifen (NOLVADEX) 10 MG tablet, Take 1 tablet (10 mg total) by mouth 2 (two) times daily., Disp: 30 tablet, Rfl: 11This was started in mid February 2010.   Flu shot given on 05/24/2011 and 04/05/2012.   SMOKING HISTORY: The patient is a former smoker. She quit in 9/09   HISTORY: I saw Kelli Abbott today for followup of her stage I invasive carcinoma of the right breast associated with DCIS. The patient is currently on tamoxifen 20 mg daily. The patient's main problem continues to be pain and limitation of movement involving both shoulders.She had a bone scan performed on 05/29/2013 which was negative for malignant findings. She does mention that a "fat deposit" has appeared 1 month ago, not painful, not enlarging. Her last chest x ray was performed on 06/01/12 and was essentially negative. No further vaginal bleeding episodes. She had a transvaginal ultrasound in 08/2012 (no records available for review). Per patient report, no new findings  were noted. Her appetite is normal. She admits to not consuming nutrient-rich foods. She is trying to make an effort to lose weight and increase ambulation. Her last albumin levels were 3.1. Today, they are 3.0. In the past her albumin  was as high as 3.7 as of June of 2013. This is being closely monitored. UA was negative for protein as of 05/2012.  No other issues are reported by Kelli Abbott.   ALLERGIES:  No Known Allergies   PHYSICAL EXAMINATION:   Filed Vitals:   10/03/12 1118  BP: 132/83  Pulse: 84  Temp: 97.3 F (36.3 C)  Resp: 22   Filed Weights   10/03/12 1118  Weight: 328 lb 8 oz (149.007 kg)   Physical Exam:  There is little change. There is no scleral icterus. Mouth and pharynx are benign. There is no peripheral adenopathy palpable. Heart and lungs are normal. Breasts remain with the right breast being a little smaller than the left breast. Both breasts are quite large. There is some mild postsurgical and radiation changes. Cosmetic result is excellent. There is some nodular type induration involving her scar in the upper outer quadrant. This seems to be stable. The left breast is benign. No dimpling or nipple inversion. The abdomen with the patient sitting is quite obese. Extremities: No peripheral edema. No Port-A- Cath or central catheter. No obvious lymphedema involving the right arm.Of note, the presence of a 7 cm fluid filled cystic area is noted just above the right scapula, freely mobile, non tender. No palpable adenopathy.  LABORATORY/RADIOLOGY DATA:  No results found for this basename: WBC, HGB, HCT, PLT, MCV, MCH, MCHC, RDW, NEUTRABS, LYMPHSABS, MONOABS, EOSABS, BASOSABS, BANDABS, BANDSABD,  in the last 168 hours  CMP    Recent Labs Lab 10/03/12 1046  NA 142  K 3.7  CL 104  CO2 28  GLUCOSE 98  BUN 13.5  CREATININE 0.9  CALCIUM 9.6  AST 12  ALT 8  ALKPHOS 77  BILITOT 0.37       Radiology Studies:   1. Digital diagnostic bilateral mammograms and right breast ultrasound on 05/13/2011 showed stable post lumpectomy changes in the upper outer quadrant of the right breast. There was a stable well-defined hypoechoic mass at the 10 o'clock position of the right breast 7 cm from the  nipple measuring 4 x 8 x 9 mm. Ultrasound showed no change in the small hypoechoic solid mass at the 10 o'clock position.  2. Chest x-ray 2 views carried out on 07/07/2011 showed no acute disease.  3. Digital diagnostic bilateral mammogram on 05/15/2012 showed no evidence for malignancy. Expected right lumpectomy changes are noted.  4. Nuclear whole body bone scan on 05/29/2012 showed no areas of abnormal bony uptake to suggest bony metastatic disease. Increased activity in the knees and feet, likely degenerative. No evidence of bony metastatic disease.  5.Chest x-ray 2-view on 06/01/2012 showed stable cardiopulmonary appearance with underlying bronchitic change. No new focal or abnormal  or acute abnormality was seen.    ASSESSMENT AND PLAN:   The patient is now  4 years of tamoxifen therapy.The patient does retain her uterus. She has had no further bleeding thus far.  she  Is to notify us if that occurs. She has been taking hydrocodone and acetaminophen for  shoulder pain. As for her low albumin, there is concern for worsening values. In other to further investigate its etiology, will order a CT of the Abdomen and Pelvis with contrast. Once results become available,  will proceed with further recommendations. CBC with diff is pending. Will see Kelli Abbott in 3 months with CBC and chemistries, sooner if needed. She knows to call us for any questions or concerns. Greater than 40 minutes were spent face to face with patient.Dr. Arline Asp has seen patient and formulated the plan.  Marlowe Kays E, PA-C 10/03/2012, 12:16 PM

## 2012-10-03 NOTE — Patient Instructions (Signed)
We are monitoring low albumin for which we are to check a CT of the abdomen and pelvis to make sure everything is ok. We will see you in 3 months to follow up with labs

## 2012-12-27 ENCOUNTER — Ambulatory Visit (HOSPITAL_COMMUNITY): Payer: PRIVATE HEALTH INSURANCE

## 2013-01-04 ENCOUNTER — Telehealth: Payer: Self-pay | Admitting: Oncology

## 2013-01-04 NOTE — Telephone Encounter (Signed)
Returned pt's call re r/s 6/13 appt but was not able to reach pt or lm

## 2013-01-05 ENCOUNTER — Ambulatory Visit: Payer: PRIVATE HEALTH INSURANCE | Admitting: Oncology

## 2013-01-05 ENCOUNTER — Other Ambulatory Visit: Payer: PRIVATE HEALTH INSURANCE

## 2013-04-24 ENCOUNTER — Other Ambulatory Visit: Payer: Self-pay | Admitting: Internal Medicine

## 2013-04-24 DIAGNOSIS — Z9889 Other specified postprocedural states: Secondary | ICD-10-CM

## 2013-04-24 DIAGNOSIS — Z853 Personal history of malignant neoplasm of breast: Secondary | ICD-10-CM

## 2013-05-16 ENCOUNTER — Ambulatory Visit
Admission: RE | Admit: 2013-05-16 | Discharge: 2013-05-16 | Disposition: A | Discharge status: Home / Self Care | Payer: PRIVATE HEALTH INSURANCE | Source: Ambulatory Visit | Attending: Internal Medicine | Admitting: Internal Medicine

## 2013-05-16 DIAGNOSIS — Z853 Personal history of malignant neoplasm of breast: Secondary | ICD-10-CM

## 2013-05-16 DIAGNOSIS — Z9889 Other specified postprocedural states: Secondary | ICD-10-CM

## 2013-06-29 ENCOUNTER — Other Ambulatory Visit: Payer: Self-pay | Admitting: *Deleted

## 2013-06-29 ENCOUNTER — Other Ambulatory Visit: Payer: Self-pay | Admitting: Medical Oncology

## 2013-06-29 DIAGNOSIS — C50911 Malignant neoplasm of unspecified site of right female breast: Secondary | ICD-10-CM

## 2013-06-29 DIAGNOSIS — C50919 Malignant neoplasm of unspecified site of unspecified female breast: Secondary | ICD-10-CM

## 2013-06-29 MED ORDER — TAMOXIFEN CITRATE 10 MG PO TABS: 10.0000 mg | ORAL_TABLET | Freq: Two times a day (BID) | ORAL | Status: DC

## 2013-06-29 NOTE — Telephone Encounter (Signed)
ROBIN BASS,RN WILL HAVE PT. SCHEDULED FOR MD VISIT.

## 2013-10-18 ENCOUNTER — Ambulatory Visit: Payer: PRIVATE HEALTH INSURANCE | Attending: Orthopedic Surgery | Admitting: Physical Therapy

## 2013-10-18 DIAGNOSIS — Z170 Estrogen receptor positive status [ER+]: Secondary | ICD-10-CM | POA: Diagnosis not present

## 2013-10-18 DIAGNOSIS — IMO0001 Reserved for inherently not codable concepts without codable children: Secondary | ICD-10-CM | POA: Diagnosis present

## 2013-10-18 DIAGNOSIS — M25519 Pain in unspecified shoulder: Secondary | ICD-10-CM | POA: Insufficient documentation

## 2013-10-18 DIAGNOSIS — Z96649 Presence of unspecified artificial hip joint: Secondary | ICD-10-CM | POA: Insufficient documentation

## 2013-10-18 DIAGNOSIS — I10 Essential (primary) hypertension: Secondary | ICD-10-CM | POA: Insufficient documentation

## 2013-10-18 DIAGNOSIS — C50919 Malignant neoplasm of unspecified site of unspecified female breast: Secondary | ICD-10-CM | POA: Diagnosis not present

## 2013-10-23 ENCOUNTER — Ambulatory Visit: Payer: PRIVATE HEALTH INSURANCE | Admitting: Physical Therapy

## 2013-10-23 DIAGNOSIS — IMO0001 Reserved for inherently not codable concepts without codable children: Secondary | ICD-10-CM | POA: Diagnosis not present

## 2013-10-24 ENCOUNTER — Ambulatory Visit: Payer: PRIVATE HEALTH INSURANCE | Attending: Orthopedic Surgery | Admitting: Physical Therapy

## 2013-10-24 DIAGNOSIS — Z5189 Encounter for other specified aftercare: Secondary | ICD-10-CM | POA: Diagnosis present

## 2013-10-24 DIAGNOSIS — M25559 Pain in unspecified hip: Secondary | ICD-10-CM | POA: Diagnosis not present

## 2013-10-24 DIAGNOSIS — R262 Difficulty in walking, not elsewhere classified: Secondary | ICD-10-CM | POA: Insufficient documentation

## 2013-10-29 ENCOUNTER — Ambulatory Visit: Payer: PRIVATE HEALTH INSURANCE | Admitting: Physical Therapy

## 2013-10-29 DIAGNOSIS — Z5189 Encounter for other specified aftercare: Secondary | ICD-10-CM | POA: Diagnosis not present

## 2013-10-31 ENCOUNTER — Encounter: Payer: PRIVATE HEALTH INSURANCE | Admitting: Physical Therapy

## 2013-11-02 ENCOUNTER — Ambulatory Visit: Payer: PRIVATE HEALTH INSURANCE | Admitting: Physical Therapy

## 2013-11-02 DIAGNOSIS — Z5189 Encounter for other specified aftercare: Secondary | ICD-10-CM | POA: Diagnosis not present

## 2013-11-07 ENCOUNTER — Ambulatory Visit: Payer: PRIVATE HEALTH INSURANCE | Admitting: Physical Therapy

## 2013-11-07 DIAGNOSIS — Z5189 Encounter for other specified aftercare: Secondary | ICD-10-CM | POA: Diagnosis not present

## 2013-11-09 ENCOUNTER — Ambulatory Visit: Payer: PRIVATE HEALTH INSURANCE | Admitting: Physical Therapy

## 2013-11-09 DIAGNOSIS — Z5189 Encounter for other specified aftercare: Secondary | ICD-10-CM | POA: Diagnosis not present

## 2013-11-12 ENCOUNTER — Ambulatory Visit: Payer: PRIVATE HEALTH INSURANCE | Admitting: Physical Therapy

## 2013-11-12 DIAGNOSIS — Z5189 Encounter for other specified aftercare: Secondary | ICD-10-CM | POA: Diagnosis not present

## 2013-11-15 ENCOUNTER — Ambulatory Visit: Payer: PRIVATE HEALTH INSURANCE | Admitting: Physical Therapy

## 2013-11-15 DIAGNOSIS — Z5189 Encounter for other specified aftercare: Secondary | ICD-10-CM | POA: Diagnosis not present

## 2013-11-19 ENCOUNTER — Ambulatory Visit: Payer: PRIVATE HEALTH INSURANCE | Admitting: Physical Therapy

## 2013-11-19 DIAGNOSIS — Z5189 Encounter for other specified aftercare: Secondary | ICD-10-CM | POA: Diagnosis not present

## 2013-11-21 ENCOUNTER — Telehealth: Payer: Self-pay | Admitting: Internal Medicine

## 2013-11-21 ENCOUNTER — Ambulatory Visit: Payer: PRIVATE HEALTH INSURANCE | Admitting: Physical Therapy

## 2013-11-21 DIAGNOSIS — Z5189 Encounter for other specified aftercare: Secondary | ICD-10-CM | POA: Diagnosis not present

## 2013-11-21 NOTE — Telephone Encounter (Signed)
S/W JESSICA @ DR. HUSAIN OFFICE AND GVE APPT FOR 05/26 @ 1:30 W/DR. CHISM. PATIENT IS A FORMER PATIENT OF DR. Ralene Ok.

## 2013-11-21 NOTE — Telephone Encounter (Signed)
C/D 11/21/13 for appt. 12/18/13

## 2013-11-26 ENCOUNTER — Ambulatory Visit: Payer: PRIVATE HEALTH INSURANCE | Attending: Orthopedic Surgery | Admitting: Physical Therapy

## 2013-11-26 DIAGNOSIS — R262 Difficulty in walking, not elsewhere classified: Secondary | ICD-10-CM | POA: Insufficient documentation

## 2013-11-26 DIAGNOSIS — M25559 Pain in unspecified hip: Secondary | ICD-10-CM | POA: Insufficient documentation

## 2013-11-26 DIAGNOSIS — Z5189 Encounter for other specified aftercare: Secondary | ICD-10-CM | POA: Diagnosis present

## 2013-11-29 ENCOUNTER — Ambulatory Visit: Payer: PRIVATE HEALTH INSURANCE | Admitting: Physical Therapy

## 2013-11-29 DIAGNOSIS — Z5189 Encounter for other specified aftercare: Secondary | ICD-10-CM | POA: Diagnosis not present

## 2013-12-04 ENCOUNTER — Ambulatory Visit: Payer: PRIVATE HEALTH INSURANCE | Admitting: Physical Therapy

## 2013-12-04 DIAGNOSIS — Z5189 Encounter for other specified aftercare: Secondary | ICD-10-CM | POA: Diagnosis not present

## 2013-12-05 ENCOUNTER — Ambulatory Visit: Payer: PRIVATE HEALTH INSURANCE | Admitting: Physical Therapy

## 2013-12-05 DIAGNOSIS — Z5189 Encounter for other specified aftercare: Secondary | ICD-10-CM | POA: Diagnosis not present

## 2013-12-14 ENCOUNTER — Ambulatory Visit: Payer: PRIVATE HEALTH INSURANCE | Admitting: Physical Therapy

## 2013-12-14 DIAGNOSIS — Z5189 Encounter for other specified aftercare: Secondary | ICD-10-CM | POA: Diagnosis not present

## 2013-12-18 ENCOUNTER — Ambulatory Visit: Payer: PRIVATE HEALTH INSURANCE | Admitting: Physical Therapy

## 2013-12-18 ENCOUNTER — Telehealth: Payer: Self-pay | Admitting: Internal Medicine

## 2013-12-18 ENCOUNTER — Ambulatory Visit (HOSPITAL_BASED_OUTPATIENT_CLINIC_OR_DEPARTMENT_OTHER): Payer: PRIVATE HEALTH INSURANCE | Admitting: Internal Medicine

## 2013-12-18 VITALS — BP 158/102 | HR 86 | Temp 98.1°F | Resp 18 | Ht 61.5 in | Wt 326.6 lb

## 2013-12-18 DIAGNOSIS — Z5189 Encounter for other specified aftercare: Secondary | ICD-10-CM | POA: Diagnosis not present

## 2013-12-18 DIAGNOSIS — C50911 Malignant neoplasm of unspecified site of right female breast: Secondary | ICD-10-CM

## 2013-12-18 DIAGNOSIS — Z853 Personal history of malignant neoplasm of breast: Secondary | ICD-10-CM

## 2013-12-18 NOTE — Telephone Encounter (Signed)
gv adn printed appts sched and avs for opt fro May 2016

## 2013-12-18 NOTE — Progress Notes (Signed)
[place], [address] [employer] [address] [employer], [address] [place] [address]  DIAGNOSIS: Breast cancer, right breast - Plan: CBC with Differential, Comprehensive metabolic panel (Cmet) - [place]  Chief Complaint  Patient presents with  . Breast cancer, right breast    CURRENT TREATMENT: Tamoxifen 20 mg daily.  Stopped about 2 months ago.   INTERVAL HISTORY: LATIFA NOBLE 58 y.o. female with a history of invasive ductal carcinoma of the right breast upper outer quadrant wit biopsy carried out on 04/26/2008 is here for follow up.  She has been lost to follow up.  She was last seen by Coralee Pesa. Wertman on 10/03/2012.  She was started on tamoxifen 20 mg on 08/2008.  She reports stopping this medication about 2 months ago.  She saw her PCP Dr. Wenda Low and made the oncology follow up appointment for today.  She reports continued hot flashes.  She also reports shoulder discomfort at night.  She is on ferrous sulfate 325 mg daily for the past three years.  She is on mobic 15 mg daily since 10/08/2013.  She was also started on tizanidine q hs.  She denies hospitalizations or interval emergency room visits. Her last mammogram was in October of 2014.   MEDICAL HISTORY: Past Medical History  Diagnosis Date  . Breast lump   . Hypertension   . Skin lesions, generalized   . Breast nodule     hypoechoic in right breast  . Cancer     breast  . Breast cancer   . Arthritis     Osteoarthritis    INTERIM HISTORY: has Breast cancer, right breast and Hypoalbuminemia on her problem list.    ALLERGIES:  has No Known Allergies.  MEDICATIONS: has a current medication list which includes the following prescription(s): acetaminophen, cholecalciferol, duloxetine, ferrous sulfate, hydrocodone-acetaminophen, meloxicam, solifenacin, tizanidine, valsartan-hydrochlorothiazide, and tamoxifen.  SURGICAL HISTORY:  Past Surgical History  Procedure Laterality Date  .  Breast surgery  2009    R breast reexcision of margins  . Total hip arthroplasty      right   PROBLEM LIST:  1. 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 The patient underwent needle localization and sentinel lymph node biopsy on 05/14/2008. The primary tumor was low-grade and measured 2 cm. 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 Margins were negative after reexcision on 05/30/2008. Three sentinel lymph nodes were negative. Estrogen and progesterone receptors were both 99%. Proliferative fraction was 10%. HER-2/neu was 2+, but negative by CISH. Oncotype DX score came back 14 with an estimated risk for distant recurrence being 9%. Tumor stage was T1c N0. Radiation treatments of the right breast were given from 07/22/2008 through 09/11/2008. The patient is currently on tamoxifen 20 mg daily. This was started in February 2010. The patient retains her uterus.  2. Morbid obesity.  3. Chronic pain involving the right hip, which had previously required Percocet following a total right hip replacement in 2005. Percocet was stopped in June 2013.  4. Hypertension.  5. Osteoarthritis -hips.  6. Urge incontinence.  7. Positive family history of breast cancer involving the patient's paternal aunts.  8. Mild intermittent leukocytosis first noted on 06/10/2008.  9. Hypoalbuminemia noted on 05/04/2012 with albumin of 3.2. As of 08/03/12 was 3.1  10. Episode of vaginal bleeding which occurred in September 2013 apparently lasting 5 days. It was planned that patient was going to have  a D and C by Dr. Thurnell Lose, however, the patient canceled this.  11. Pain and limitation of movement involving both shoulders.  12. Anxiety  13. Depression  14. History of anemia  15. Obesity  REVIEW OF SYSTEMS:   Constitutional: Denies fevers, chills or abnormal weight loss Eyes: Denies blurriness of vision Ears, nose, mouth, throat, and face:  Denies mucositis or sore throat Respiratory: Denies cough, dyspnea or wheezes Cardiovascular: Denies palpitation, chest discomfort or lower extremity swelling Gastrointestinal:  Denies nausea, heartburn or change in bowel habits Skin: Denies abnormal skin rashes Lymphatics: Denies new lymphadenopathy or easy bruising Neurological:Denies numbness, tingling or new weaknesses Behavioral/Psych: Mood is stable, no new changes  All other systems were reviewed with the patient and are negative.  PHYSICAL EXAMINATION: ECOG PERFORMANCE STATUS: 0 - Asymptomatic  Blood pressure 158/102, pulse 86, temperature 98.1 F (36.7 C), temperature source Oral, resp. rate 18, height 5' 1.5" (1.562 m), weight 326 lb 9.6 oz (148.145 kg), SpO2 98.00%.  GENERAL:alert, no distress and comfortable;obese, anxious SKIN: skin color, texture, turgor are normal, no rashes or significant lesions EYES: normal, Conjunctiva are pink and non-injected, sclera clear OROPHARYNX:no exudate, no erythema and lips, buccal mucosa, and tongue normal  NECK: supple, thyroid normal size, non-tender, without nodularity BREASTS: Breasts remain with the right breast being a little smaller than the left breast. Both breasts are quite large. There is some mild postsurgical and radiation changes. Cosmetic result is excellent. There is some nodular type induration involving her scar in the upper outer quadrant. This seems to be stable. The left breast is benign. No dimpling or nipple inversion.  LYMPH:  no palpable lymphadenopathy in the cervical, axillary or supraclavicular LUNGS: clear to auscultation with normal breathing effort, no wheezes or rhonchi HEART: regular rate & rhythm and no murmurs and no lower extremity edema ABDOMEN:abdomen soft, non-tender and normal bowel sounds Musculoskeletal:no cyanosis of digits and no clubbing  NEURO: alert & oriented x 3 with fluent speech, no focal motor/sensory deficits  Labs:  Lab Results   Component Value Date   WBC 10.3 08/03/2012   HGB 11.5* 08/03/2012   HCT 34.9 08/03/2012   MCV 83.9 08/03/2012   PLT 313 08/03/2012   NEUTROABS 6.4 08/03/2012      Chemistry      Component Value Date/Time   NA 142 10/03/2012 1046   NA 140 01/03/2012 1138   K 3.7 10/03/2012 1046   K 3.5 01/03/2012 1138   CL 104 10/03/2012 1046   CL 104 01/03/2012 1138   CO2 28 10/03/2012 1046   CO2 26 01/03/2012 1138   BUN 13.5 10/03/2012 1046   BUN 13 01/03/2012 1138   CREATININE 0.9 10/03/2012 1046   CREATININE 0.79 01/03/2012 1138      Component Value Date/Time   CALCIUM 9.6 10/03/2012 1046   CALCIUM 9.7 01/03/2012 1138   ALKPHOS 77 10/03/2012 1046   ALKPHOS 74 01/03/2012 1138   AST 12 10/03/2012 1046   AST 11 01/03/2012 1138   ALT 8 10/03/2012 1046   ALT 8 01/03/2012 1138   BILITOT 0.37 10/03/2012 1046   BILITOT 0.3 01/03/2012 1138     Studies:  No results found.   RADIOGRAPHIC STUDIES: DIGITAL DIAGNOSTIC BILATERAL MAMMOGRAM  COMPARISON: Multiple prior studies including 05/15/2012,  05/13/2011, and 04/20/2010  ACR Breast Density Category b: There are scattered areas of  fibroglandular density.  FINDINGS:  Postsurgical scar 10 o'clock right breast posteriorly, stable, and  associated with stable fat necrosis calcifications. 2  circumscribed  sub cm upper-outer quadrant round isodense left breast masses,  stable from 04/26/2008. No significant change on either side.  Mammographic images were processed with CAD.  IMPRESSION:  Stable benign postoperative appearance  RECOMMENDATION:  Diagnostic bilateral mammogram in 1 year  I have discussed the findings and recommendations with the patient.  Results were also provided in writing at the conclusion of the  visit. If applicable, a reminder letter will be sent to the patient  regarding the next appointment.  BI-RADS CATEGORY 2: Benign Finding(s)   ASSESSMENT: Alcide Goodness 58 y.o. female with a history of Breast cancer, right breast - Plan: CBC with  Differential, Comprehensive metabolic panel (Cmet) - [facility]   PLAN:   1. R breast DCIS. -- The patient is now over 5 years of tamoxifen therapy. Mammogram yearly.  We agree with discontinuation of her tamoxifen.  For DCIS, I favor 5 years a therapy.  In addition, she has continued hot flashes and a history of bleeding.  Continue annual follow ups. She reports having labs at her PCP's office. She is scheduled for follow up in the next month.   2. Follow-up. --Labs annually with her visits.    All questions were answered. The patient knows to call the clinic with any problems, questions or concerns. We can certainly see the patient much sooner if necessary.  I spent 15 minutes counseling the patient face to face. The total time spent in the appointment was 25 minutes.    Concha Norway, MD 12/18/2013 2:47 PM

## 2013-12-20 ENCOUNTER — Encounter: Payer: PRIVATE HEALTH INSURANCE | Admitting: Physical Therapy

## 2013-12-25 ENCOUNTER — Ambulatory Visit: Payer: PRIVATE HEALTH INSURANCE | Attending: Orthopedic Surgery | Admitting: Physical Therapy

## 2013-12-25 DIAGNOSIS — R262 Difficulty in walking, not elsewhere classified: Secondary | ICD-10-CM | POA: Diagnosis not present

## 2013-12-25 DIAGNOSIS — Z5189 Encounter for other specified aftercare: Secondary | ICD-10-CM | POA: Insufficient documentation

## 2013-12-25 DIAGNOSIS — M25559 Pain in unspecified hip: Secondary | ICD-10-CM | POA: Diagnosis not present

## 2013-12-28 ENCOUNTER — Ambulatory Visit: Payer: PRIVATE HEALTH INSURANCE | Admitting: Physical Therapy

## 2013-12-28 DIAGNOSIS — Z5189 Encounter for other specified aftercare: Secondary | ICD-10-CM | POA: Diagnosis not present

## 2014-01-14 ENCOUNTER — Ambulatory Visit: Payer: PRIVATE HEALTH INSURANCE | Admitting: Physical Therapy

## 2014-01-14 DIAGNOSIS — Z5189 Encounter for other specified aftercare: Secondary | ICD-10-CM | POA: Diagnosis not present

## 2014-01-17 ENCOUNTER — Ambulatory Visit: Payer: PRIVATE HEALTH INSURANCE | Admitting: Physical Therapy

## 2014-01-17 DIAGNOSIS — Z5189 Encounter for other specified aftercare: Secondary | ICD-10-CM | POA: Diagnosis not present

## 2014-01-21 ENCOUNTER — Ambulatory Visit: Payer: PRIVATE HEALTH INSURANCE | Admitting: Physical Therapy

## 2014-01-21 DIAGNOSIS — Z5189 Encounter for other specified aftercare: Secondary | ICD-10-CM | POA: Diagnosis not present

## 2014-01-24 ENCOUNTER — Ambulatory Visit: Payer: PRIVATE HEALTH INSURANCE | Attending: Orthopedic Surgery | Admitting: Physical Therapy

## 2014-01-24 DIAGNOSIS — R262 Difficulty in walking, not elsewhere classified: Secondary | ICD-10-CM | POA: Insufficient documentation

## 2014-01-24 DIAGNOSIS — M25559 Pain in unspecified hip: Secondary | ICD-10-CM | POA: Insufficient documentation

## 2014-01-24 DIAGNOSIS — Z5189 Encounter for other specified aftercare: Secondary | ICD-10-CM | POA: Diagnosis present

## 2014-02-04 ENCOUNTER — Ambulatory Visit: Payer: PRIVATE HEALTH INSURANCE | Admitting: Physical Therapy

## 2014-02-04 DIAGNOSIS — Z5189 Encounter for other specified aftercare: Secondary | ICD-10-CM | POA: Diagnosis not present

## 2014-02-08 ENCOUNTER — Ambulatory Visit: Payer: PRIVATE HEALTH INSURANCE | Admitting: Physical Therapy

## 2014-02-08 DIAGNOSIS — Z5189 Encounter for other specified aftercare: Secondary | ICD-10-CM | POA: Diagnosis not present

## 2014-02-11 ENCOUNTER — Ambulatory Visit: Payer: PRIVATE HEALTH INSURANCE | Admitting: Physical Therapy

## 2014-02-14 ENCOUNTER — Ambulatory Visit: Payer: PRIVATE HEALTH INSURANCE | Admitting: Physical Therapy

## 2014-02-17 ENCOUNTER — Emergency Department (INDEPENDENT_AMBULATORY_CARE_PROVIDER_SITE_OTHER)
Admission: EM | Admit: 2014-02-17 | Discharge: 2014-02-17 | Disposition: A | Discharge status: Home / Self Care | Payer: PRIVATE HEALTH INSURANCE | Source: Home / Self Care

## 2014-02-17 DIAGNOSIS — M609 Myositis, unspecified: Secondary | ICD-10-CM

## 2014-02-17 DIAGNOSIS — T1490XA Injury, unspecified, initial encounter: Secondary | ICD-10-CM

## 2014-02-17 DIAGNOSIS — IMO0001 Reserved for inherently not codable concepts without codable children: Secondary | ICD-10-CM

## 2014-02-17 DIAGNOSIS — T148XXA Other injury of unspecified body region, initial encounter: Secondary | ICD-10-CM

## 2014-02-17 MED ORDER — KETOROLAC TROMETHAMINE 60 MG/2ML IM SOLN
INTRAMUSCULAR | Status: AC
  Filled 2014-02-17: qty 2

## 2014-02-17 MED ORDER — OXYCODONE-ACETAMINOPHEN 5-325 MG PO TABS: 1.0000 | ORAL_TABLET | Freq: Four times a day (QID) | ORAL | Status: DC | PRN

## 2014-02-17 MED ORDER — KETOROLAC TROMETHAMINE 60 MG/2ML IM SOLN
60.0000 mg | Freq: Once | INTRAMUSCULAR | Status: AC
  Administered 2014-02-17: 60 mg via INTRAMUSCULAR

## 2014-02-17 NOTE — ED Notes (Signed)
Pt  Has  Pain  r  Hip   X  4  Days   With  Pain  Radiating  Down  r leg    -  Burning  In nature      denys  Any  specefic  Injury           Pt  States     Has  Been  Getting  Physical  Therapy           As well  Pt  Ambulates  With the  Help of a  [employer]

## 2014-02-17 NOTE — ED Provider Notes (Signed)
CSN: [id number]     Arrival date & time 02/17/14  1313 History   First MD Initiated Contact with Patient 02/17/14 1336     Chief Complaint  Patient presents with  . Hip Pain   (Consider location/radiation/quality/duration/timing/severity/associated sxs/prior Treatment) HPI Comments: 58 year old severely morbidly obese female with a history of severe osteoarthritis with subsequent hip replacement of the right he joint is complaining of pain to the surrounding areas of the right hip and thigh. This began after having a few sessions of physical therapy in which her hip and thigh musculature was stretched, flexed and rotated. She describes the pain as a burning sensation. She is able to ambulate with a cane. No recent history of a fall or other trauma.   Past Medical History  Diagnosis Date  . Breast lump   . Hypertension   . Skin lesions, generalized   . Breast nodule     hypoechoic in right breast  . Cancer     breast  . Breast cancer   . Arthritis     Osteoarthritis   Past Surgical History  Procedure Laterality Date  . Breast surgery  2009    R breast reexcision of margins  . Total hip arthroplasty      right   Family History  Problem Relation Age of Onset  . Hypertension Mother   . Hypertension Father   . Hypertension Brother   . Cancer Paternal Aunt    History  Substance Use Topics  . Smoking status: Former Smoker -- 1.00 packs/day for 20 years    Types: Cigarettes    Quit date: 04/26/2008  . Smokeless tobacco: Never Used  . Alcohol Use: Yes     Comment: less than 1/wk   OB History   Grav Para Term Preterm Abortions TAB SAB Ect Mult Living                 Review of Systems  Constitutional: Negative for fever, chills and activity change.  HENT: Negative.   Respiratory: Negative.   Cardiovascular: Negative.   Musculoskeletal: Positive for gait problem and myalgias. Negative for back pain and neck pain.       As per HPI  Skin: Negative for color change, pallor  and rash.  Neurological: Negative.     Allergies  Review of patient's allergies indicates no known allergies.  Home Medications   Prior to Admission medications   Medication Sig Start Date End Date Taking? Authorizing Provider  acetaminophen (TYLENOL) 325 MG tablet Take 650 mg by mouth every 6 (six) hours as needed.      Historical Provider, MD  Cholecalciferol (VITAMIN D PO) Take 1,000 mg by mouth daily.      Historical Provider, MD  DULoxetine (CYMBALTA) 60 MG capsule Take 1 capsule by mouth daily. 10/08/13   Historical Provider, MD  ferrous sulfate 325 (65 FE) MG tablet Take 325 mg by mouth daily with breakfast.    Historical Provider, MD  meloxicam (MOBIC) 15 MG tablet Take 1 tablet by mouth daily. 10/08/13   Historical Provider, MD  oxyCODONE-acetaminophen (PERCOCET/ROXICET) 5-325 MG per tablet Take 1-2 tablets by mouth every 6 (six) hours as needed for severe pain. 02/17/14   Janne Napoleon, NP  solifenacin (VESICARE) 5 MG tablet Take 5 mg by mouth daily.     Historical Provider, MD  tamoxifen (NOLVADEX) 10 MG tablet Take 1 tablet (10 mg total) by mouth 2 (two) times daily. 06/29/13   Concha Norway, MD  tiZANidine (ZANAFLEX) 4  MG tablet Take 1 tablet by mouth at bedtime. 12/18/13   Historical Provider, MD  valsartan-hydrochlorothiazide (DIOVAN-HCT) 320-25 MG per tablet  06/27/12   Historical Provider, MD   BP 171/112  Pulse 110  Temp(Src) 97.4 F (36.3 C) (Oral)  SpO2 96% Physical Exam  Nursing note and vitals reviewed. Constitutional: She is oriented to person, place, and time. She appears well-developed and well-nourished. No distress.  Severe obesity  HENT:  Head: Normocephalic and atraumatic.  Eyes: EOM are normal. Pupils are equal, round, and reactive to light.  Neck: Normal range of motion. Neck supple.  Cardiovascular: Normal rate.   Pulmonary/Chest: Effort normal. No respiratory distress.  Musculoskeletal:  There is moderate to severe tenderness to the soft tissue surrounding  the right pelvis, hip and lateral thigh. Attempts to flex the thigh or abduct produces increased pain. There is no deformity or overlying discoloration or asymmetry. Distal neurovascular and motor sensory is intact. No distal edema.  Neurological: She is alert and oriented to person, place, and time. No cranial nerve deficit.  Skin: Skin is warm and dry.    ED Course  Procedures (including critical care time) Labs Review Labs Reviewed - No data to display  Imaging Review No results found.   MDM   1. Myalgia, traumatic   2. Myofasciitis    is unlikely that this is a bony injury although she may continue to have pain due to osteoarthritis. Most likely this is muscle pain secondary to the physical therapy maneuvers. She was taking Vicodin but it caused her to have some dizziness. She brought that tablet and half and it did not cause dizziness that he did not help the pain either. We discussed potential side effects of all his medications and that the Percocet may also have similar side effects. She is to be aware of drowsiness, dizziness and other side effects which may occur. If so stopped the medicine and call your doctor. Toradol 60 mg IM. Will change to percocet with warnings that any of these meds can cause adverse effects Heat Limit activity for a short period of time Followup with your PCP. Be sure to tell the physical therapist the events that occurred if you have another session. Do not start water aerobics tomorrow suture having so much pain. He may try to start next Tuesday as scheduled.        Janne Napoleon, NP 02/17/14 1443

## 2014-02-17 NOTE — Discharge Instructions (Signed)
Muscle Pain Muscle pain (myalgia) may be caused by many things, including:  Overuse or muscle strain, especially if you are not in shape. This is the most common cause of muscle pain.  Injury.  Bruises.  Viruses, such as the flu.  Infectious diseases.  Fibromyalgia, which is a chronic condition that causes muscle tenderness, fatigue, and headache.  Autoimmune diseases, including lupus.  Certain drugs, including ACE inhibitors and statins. Muscle pain may be mild or severe. In most cases, the pain lasts only a short time and goes away without treatment. To diagnose the cause of your muscle pain, your health care provider will take your medical history. This means he or she will ask you when your muscle pain began and what has been happening. If you have not had muscle pain for very long, your health care provider may want to wait before doing much testing. If your muscle pain has lasted a long time, your health care provider may want to run tests right away. If your health care provider thinks your muscle pain may be caused by illness, you may need to have additional tests to rule out certain conditions.  Treatment for muscle pain depends on the cause. Home care is often enough to relieve muscle pain. Your health care provider may also prescribe anti-inflammatory medicine. HOME CARE INSTRUCTIONS Watch your condition for any changes. The following actions may help to lessen any discomfort you are feeling:  Only take over-the-counter or prescription medicines as directed by your health care provider.  USe heat, heating pad to muscles  You may alternate applying hot and cold packs to the muscle as directed by your health care provider.  If overuse is causing your muscle pain, slow down your activities until the pain goes away.  Remember that it is normal to feel some muscle pain after starting a workout program. Muscles that have not been used often will be sore at first.  Do regular,  gentle exercises if you are not usually active.  Warm up before exercising to lower your risk of muscle pain.  Do not continue working out if the pain is very bad. Bad pain could mean you have injured a muscle. SEEK MEDICAL CARE IF:  Your muscle pain gets worse, and medicines do not help.  You have muscle pain that lasts longer than 3 days.  You have a rash or fever along with muscle pain.  You have muscle pain after a tick bite.  You have muscle pain while working out, even though you are in good physical condition.  You have redness, soreness, or swelling along with muscle pain.  You have muscle pain after starting a new medicine or changing the dose of a medicine. SEEK IMMEDIATE MEDICAL CARE IF:  You have trouble breathing.  You have trouble swallowing.  You have muscle pain along with a stiff neck, fever, and vomiting.  You have severe muscle weakness or cannot move part of your body. MAKE SURE YOU:   Understand these instructions.  Will watch your condition.  Will get help right away if you are not doing well or get worse. Document Released: 06/03/2006 Document Revised: 07/17/2013 Document Reviewed: 05/08/2013 [facility] Patient Information 2015 [place], [place]. This information is not intended to replace advice given to you by your health care provider. Make sure you discuss any questions you have with your health care provider.  Myofascial Pain Syndrome  Myofascial pain (MP) is one of the most common causes of discomfort. This pain may be  felt in the muscles. It may come and go. MP always has trigger or tender points in the muscle that will cause pain when pressed. HOME CARE   Learn to do gentle exercise. Stretch.  Change your position every hour.  Eat a healthy diet.  Find ways to relax and lessen stress.  Avoid things that make your pain worse.  Get a massage by someone trained to release trigger or tender points.  Only take medicine as told by your  doctor.  Get follow-up care as needed. GET HELP RIGHT AWAY IF:   Your pain suddenly gets worse.  You are not able to move your arms or legs.  You have pain in your chest.  It is hard to breathe. MAKE SURE YOU:  Understand these instructions.  Will watch your condition.  Will get help right away if you are not doing well or get worse. Document Released: 06/24/2008 Document Revised: 10/04/2011 Document Reviewed: 06/24/2008 [facility] Patient Information 2015 [place], [place]. This information is not intended to replace advice given to you by your health care provider. Make sure you discuss any questions you have with your health care provider.

## 2014-02-21 NOTE — ED Provider Notes (Signed)
Medical screening examination/treatment/procedure(s) were performed by a resident physician or non-physician practitioner and as the supervising physician I was immediately available for consultation/collaboration.  Lynne Leader, MD    Gregor Hams, MD 02/21/14 [phone]

## 2014-05-07 ENCOUNTER — Other Ambulatory Visit: Payer: Self-pay | Admitting: Internal Medicine

## 2014-05-07 DIAGNOSIS — Z9889 Other specified postprocedural states: Secondary | ICD-10-CM

## 2014-05-07 DIAGNOSIS — Z853 Personal history of malignant neoplasm of breast: Secondary | ICD-10-CM

## 2014-05-17 ENCOUNTER — Ambulatory Visit
Admission: RE | Admit: 2014-05-17 | Discharge: 2014-05-17 | Disposition: A | Discharge status: Home / Self Care | Payer: PRIVATE HEALTH INSURANCE | Source: Ambulatory Visit | Attending: Internal Medicine | Admitting: Internal Medicine

## 2014-05-17 DIAGNOSIS — Z853 Personal history of malignant neoplasm of breast: Secondary | ICD-10-CM

## 2014-05-17 DIAGNOSIS — Z9889 Other specified postprocedural states: Secondary | ICD-10-CM

## 2014-09-25 ENCOUNTER — Other Ambulatory Visit: Payer: Self-pay | Admitting: Obstetrics and Gynecology

## 2014-11-05 ENCOUNTER — Encounter (HOSPITAL_COMMUNITY): Payer: Self-pay

## 2014-11-05 ENCOUNTER — Other Ambulatory Visit: Payer: Self-pay

## 2014-11-05 ENCOUNTER — Encounter (HOSPITAL_COMMUNITY)
Admission: RE | Admit: 2014-11-05 | Discharge: 2014-11-05 | Disposition: A | Discharge status: Home / Self Care | Payer: Medicare Other | Source: Ambulatory Visit | Attending: Obstetrics and Gynecology | Admitting: Obstetrics and Gynecology

## 2014-11-05 DIAGNOSIS — Z87891 Personal history of nicotine dependence: Secondary | ICD-10-CM | POA: Diagnosis not present

## 2014-11-05 DIAGNOSIS — Z791 Long term (current) use of non-steroidal anti-inflammatories (NSAID): Secondary | ICD-10-CM | POA: Diagnosis not present

## 2014-11-05 DIAGNOSIS — I10 Essential (primary) hypertension: Secondary | ICD-10-CM | POA: Diagnosis not present

## 2014-11-05 DIAGNOSIS — G4730 Sleep apnea, unspecified: Secondary | ICD-10-CM | POA: Diagnosis not present

## 2014-11-05 DIAGNOSIS — Z853 Personal history of malignant neoplasm of breast: Secondary | ICD-10-CM | POA: Diagnosis not present

## 2014-11-05 DIAGNOSIS — Z79899 Other long term (current) drug therapy: Secondary | ICD-10-CM | POA: Diagnosis not present

## 2014-11-05 DIAGNOSIS — Z923 Personal history of irradiation: Secondary | ICD-10-CM | POA: Diagnosis not present

## 2014-11-05 DIAGNOSIS — Z96641 Presence of right artificial hip joint: Secondary | ICD-10-CM | POA: Diagnosis not present

## 2014-11-05 DIAGNOSIS — N950 Postmenopausal bleeding: Secondary | ICD-10-CM | POA: Diagnosis present

## 2014-11-05 DIAGNOSIS — D649 Anemia, unspecified: Secondary | ICD-10-CM | POA: Diagnosis not present

## 2014-11-05 DIAGNOSIS — D250 Submucous leiomyoma of uterus: Secondary | ICD-10-CM | POA: Diagnosis not present

## 2014-11-05 DIAGNOSIS — N858 Other specified noninflammatory disorders of uterus: Secondary | ICD-10-CM | POA: Diagnosis not present

## 2014-11-05 DIAGNOSIS — Z6841 Body Mass Index (BMI) 40.0 and over, adult: Secondary | ICD-10-CM | POA: Diagnosis not present

## 2014-11-05 DIAGNOSIS — Z9011 Acquired absence of right breast and nipple: Secondary | ICD-10-CM | POA: Diagnosis not present

## 2014-11-05 DIAGNOSIS — M549 Dorsalgia, unspecified: Secondary | ICD-10-CM | POA: Diagnosis not present

## 2014-11-05 DIAGNOSIS — F329 Major depressive disorder, single episode, unspecified: Secondary | ICD-10-CM | POA: Diagnosis not present

## 2014-11-05 DIAGNOSIS — G8929 Other chronic pain: Secondary | ICD-10-CM | POA: Diagnosis not present

## 2014-11-05 DIAGNOSIS — Z79891 Long term (current) use of opiate analgesic: Secondary | ICD-10-CM | POA: Diagnosis not present

## 2014-11-05 DIAGNOSIS — F419 Anxiety disorder, unspecified: Secondary | ICD-10-CM | POA: Diagnosis not present

## 2014-11-05 DIAGNOSIS — M1612 Unilateral primary osteoarthritis, left hip: Secondary | ICD-10-CM | POA: Diagnosis not present

## 2014-11-05 HISTORY — DX: Bursitis of unspecified shoulder: M75.50

## 2014-11-05 LAB — CBC
HCT: 39.7 % (ref 36.0–46.0)
Hemoglobin: 12.8 g/dL (ref 12.0–15.0)
MCH: 27.1 pg (ref 26.0–34.0)
MCHC: 32.2 g/dL (ref 30.0–36.0)
MCV: 83.9 fL (ref 78.0–100.0)
PLATELETS: 380 10*3/uL (ref 150–400)
RBC: 4.73 MIL/uL (ref 3.87–5.11)
RDW: 16.6 % — ABNORMAL HIGH (ref 11.5–15.5)
WBC: 14 10*3/uL — ABNORMAL HIGH (ref 4.0–10.5)

## 2014-11-05 LAB — BASIC METABOLIC PANEL
Anion gap: 7 (ref 5–15)
BUN: 13 mg/dL (ref 6–23)
CHLORIDE: 102 mmol/L (ref 96–112)
CO2: 31 mmol/L (ref 19–32)
CREATININE: 0.79 mg/dL (ref 0.50–1.10)
Calcium: 10 mg/dL (ref 8.4–10.5)
GFR calc Af Amer: >90 mL/min (ref >90)
GFR calc non Af Amer: >90 mL/min (ref >90)
Glucose, Bld: 93 mg/dL (ref 70–99)
Potassium: 4.4 mmol/L (ref 3.5–5.1)
Sodium: 140 mmol/L (ref 135–145)

## 2014-11-05 LAB — TYPE AND SCREEN
ABO/RH(D): A POS
Antibody Screen: NEGATIVE

## 2014-11-05 NOTE — Anesthesia Preprocedure Evaluation (Addendum)
Anesthesia Evaluation  Patient identified by MRN, date of birth, ID band Patient awake    Reviewed: Allergy & Precautions, NPO status , Patient's Chart, lab work & pertinent test results, reviewed documented beta blocker date and time   Airway Mallampati: II   Neck ROM: Full    Dental  (+) Teeth Intact, Dental Advisory Given   Pulmonary Sleep apnea: by definition. , former smoker (quit 2009 20 pack years),  breath sounds clear to auscultation        Cardiovascular hypertension, Pt. on medications Rhythm:Regular     Neuro/Psych    GI/Hepatic negative GI ROS, Neg liver ROS,   Endo/Other  Morbid obesity (BMI 56)  Renal/GU negative Renal ROS     Musculoskeletal  (+) Arthritis -, Osteoarthritis,  SP hip replacement   Abdominal (+) + obese,   Peds  Hematology   Anesthesia Other Findings SP HIP replacement, care in positioning to guard against dislocation of hip  Reproductive/Obstetrics                         Anesthesia Physical Anesthesia Plan  ASA: III  Anesthesia Plan: General   Post-op Pain Management:    Induction: Intravenous and Rapid sequence  Airway Management Planned: Oral ETT and Video Laryngoscope Planned  Additional Equipment:   Intra-op Plan:   Post-operative Plan: Extubation in OR  Informed Consent: I have reviewed the patients History and Physical, chart, labs and discussed the procedure including the risks, benefits and alternatives for the proposed anesthesia with the patient or authorized representative who has indicated his/her understanding and acceptance.     Plan Discussed with:   Anesthesia Plan Comments: (GLIDE available, multimodal pain RX, minimize narcotics)        Anesthesia Quick Evaluation

## 2014-11-05 NOTE — Patient Instructions (Signed)
Your procedure is scheduled on:11/06/14  Enter through the Main Entrance at :9am Pick up desk phone and dial [phone] and inform us of your arrival.  Please call [phone] if you have any problems the morning of surgery.  Remember: Do not eat food or drink liquids, including water, after midnight:tonight    You may brush your teeth the morning of surgery.  Take these meds the morning of surgery with a sip of water: Diovan, Cymbalta  DO NOT wear jewelry, eye make-up, lipstick,body lotion, or dark fingernail polish.  (Polished toes are ok) You may wear deodorant.   Patients discharged on the day of surgery will not be allowed to drive home. Wear loose fitting, comfortable clothes for your ride home.

## 2014-11-06 ENCOUNTER — Telehealth: Payer: Self-pay | Admitting: Hematology and Oncology

## 2014-11-06 ENCOUNTER — Encounter (HOSPITAL_COMMUNITY): Payer: Self-pay | Admitting: Certified Registered Nurse Anesthetist

## 2014-11-06 ENCOUNTER — Encounter (HOSPITAL_COMMUNITY)
Admission: RE | Disposition: A | Discharge status: Home / Self Care | Payer: Self-pay | Source: Ambulatory Visit | Attending: Obstetrics and Gynecology

## 2014-11-06 ENCOUNTER — Ambulatory Visit (HOSPITAL_COMMUNITY): Payer: Medicare Other | Admitting: Anesthesiology

## 2014-11-06 ENCOUNTER — Ambulatory Visit (HOSPITAL_COMMUNITY)
Admission: RE | Admit: 2014-11-06 | Discharge: 2014-11-06 | Disposition: A | Discharge status: Home / Self Care | Payer: Medicare Other | Source: Ambulatory Visit | Attending: Obstetrics and Gynecology | Admitting: Obstetrics and Gynecology

## 2014-11-06 DIAGNOSIS — N858 Other specified noninflammatory disorders of uterus: Secondary | ICD-10-CM | POA: Diagnosis not present

## 2014-11-06 DIAGNOSIS — Z9011 Acquired absence of right breast and nipple: Secondary | ICD-10-CM | POA: Insufficient documentation

## 2014-11-06 DIAGNOSIS — D250 Submucous leiomyoma of uterus: Secondary | ICD-10-CM | POA: Insufficient documentation

## 2014-11-06 DIAGNOSIS — Z79899 Other long term (current) drug therapy: Secondary | ICD-10-CM | POA: Insufficient documentation

## 2014-11-06 DIAGNOSIS — F329 Major depressive disorder, single episode, unspecified: Secondary | ICD-10-CM | POA: Insufficient documentation

## 2014-11-06 DIAGNOSIS — G4730 Sleep apnea, unspecified: Secondary | ICD-10-CM | POA: Insufficient documentation

## 2014-11-06 DIAGNOSIS — M1612 Unilateral primary osteoarthritis, left hip: Secondary | ICD-10-CM | POA: Insufficient documentation

## 2014-11-06 DIAGNOSIS — Z853 Personal history of malignant neoplasm of breast: Secondary | ICD-10-CM | POA: Insufficient documentation

## 2014-11-06 DIAGNOSIS — Z791 Long term (current) use of non-steroidal anti-inflammatories (NSAID): Secondary | ICD-10-CM | POA: Insufficient documentation

## 2014-11-06 DIAGNOSIS — N950 Postmenopausal bleeding: Secondary | ICD-10-CM

## 2014-11-06 DIAGNOSIS — Z79891 Long term (current) use of opiate analgesic: Secondary | ICD-10-CM | POA: Insufficient documentation

## 2014-11-06 DIAGNOSIS — Z87891 Personal history of nicotine dependence: Secondary | ICD-10-CM | POA: Insufficient documentation

## 2014-11-06 DIAGNOSIS — I10 Essential (primary) hypertension: Secondary | ICD-10-CM | POA: Diagnosis not present

## 2014-11-06 DIAGNOSIS — F419 Anxiety disorder, unspecified: Secondary | ICD-10-CM | POA: Insufficient documentation

## 2014-11-06 DIAGNOSIS — M549 Dorsalgia, unspecified: Secondary | ICD-10-CM | POA: Insufficient documentation

## 2014-11-06 DIAGNOSIS — D649 Anemia, unspecified: Secondary | ICD-10-CM | POA: Insufficient documentation

## 2014-11-06 DIAGNOSIS — Z923 Personal history of irradiation: Secondary | ICD-10-CM | POA: Insufficient documentation

## 2014-11-06 DIAGNOSIS — Z96641 Presence of right artificial hip joint: Secondary | ICD-10-CM | POA: Insufficient documentation

## 2014-11-06 DIAGNOSIS — G8929 Other chronic pain: Secondary | ICD-10-CM | POA: Insufficient documentation

## 2014-11-06 DIAGNOSIS — Z6841 Body Mass Index (BMI) 40.0 and over, adult: Secondary | ICD-10-CM | POA: Insufficient documentation

## 2014-11-06 HISTORY — PX: HYSTEROSCOPY WITH D & C: SHX1775

## 2014-11-06 LAB — ABO/RH: ABO/RH(D): A POS

## 2014-11-06 SURGERY — DILATATION AND CURETTAGE /HYSTEROSCOPY
Anesthesia: General | Site: Vagina

## 2014-11-06 MED ORDER — DEXAMETHASONE SODIUM PHOSPHATE 10 MG/ML IJ SOLN
INTRAMUSCULAR | Status: DC | PRN
  Administered 2014-11-06: 4 mg via INTRAVENOUS

## 2014-11-06 MED ORDER — SUCCINYLCHOLINE CHLORIDE 20 MG/ML IJ SOLN
INTRAMUSCULAR | Status: AC
  Filled 2014-11-06: qty 10

## 2014-11-06 MED ORDER — PROMETHAZINE HCL 25 MG/ML IJ SOLN: 6.2500 mg | INTRAMUSCULAR | Status: DC | PRN

## 2014-11-06 MED ORDER — FENTANYL CITRATE 0.05 MG/ML IJ SOLN
INTRAMUSCULAR | Status: AC
  Filled 2014-11-06: qty 5

## 2014-11-06 MED ORDER — ALBUTEROL SULFATE HFA 108 (90 BASE) MCG/ACT IN AERS
INHALATION_SPRAY | RESPIRATORY_TRACT | Status: DC | PRN
  Administered 2014-11-06: 2 via RESPIRATORY_TRACT

## 2014-11-06 MED ORDER — LACTATED RINGERS IV SOLN
INTRAVENOUS | Status: DC
  Administered 2014-11-06 (×2): via INTRAVENOUS

## 2014-11-06 MED ORDER — LIDOCAINE HCL 2 % IJ SOLN
INTRAMUSCULAR | Status: AC
  Filled 2014-11-06: qty 20

## 2014-11-06 MED ORDER — EPHEDRINE SULFATE 50 MG/ML IJ SOLN
INTRAMUSCULAR | Status: DC | PRN
  Administered 2014-11-06 (×2): 5 mg via INTRAVENOUS

## 2014-11-06 MED ORDER — MIDAZOLAM HCL 2 MG/2ML IJ SOLN
INTRAMUSCULAR | Status: DC | PRN
  Administered 2014-11-06: 2 mg via INTRAVENOUS

## 2014-11-06 MED ORDER — PROPOFOL 10 MG/ML IV BOLUS
INTRAVENOUS | Status: AC
Start: 2014-11-06 — End: 2014-11-06
  Filled 2014-11-06: qty 20

## 2014-11-06 MED ORDER — EPHEDRINE 5 MG/ML INJ
INTRAVENOUS | Status: AC
  Filled 2014-11-06: qty 10

## 2014-11-06 MED ORDER — ONDANSETRON HCL 4 MG/2ML IJ SOLN
INTRAMUSCULAR | Status: AC
Start: 2014-11-06 — End: 2014-11-06
  Filled 2014-11-06: qty 2

## 2014-11-06 MED ORDER — KETOROLAC TROMETHAMINE 30 MG/ML IJ SOLN
INTRAMUSCULAR | Status: DC | PRN
  Administered 2014-11-06: 30 mg via INTRAVENOUS

## 2014-11-06 MED ORDER — PROPOFOL 10 MG/ML IV BOLUS
INTRAVENOUS | Status: DC | PRN
  Administered 2014-11-06: 50 mg via INTRAVENOUS
  Administered 2014-11-06: 200 mg via INTRAVENOUS

## 2014-11-06 MED ORDER — GLYCOPYRROLATE 0.2 MG/ML IJ SOLN
INTRAMUSCULAR | Status: AC
  Filled 2014-11-06: qty 3

## 2014-11-06 MED ORDER — FENTANYL CITRATE 0.05 MG/ML IJ SOLN
INTRAMUSCULAR | Status: AC
  Filled 2014-11-06: qty 2

## 2014-11-06 MED ORDER — FENTANYL CITRATE 0.05 MG/ML IJ SOLN
INTRAMUSCULAR | Status: DC | PRN
  Administered 2014-11-06 (×2): 50 ug via INTRAVENOUS

## 2014-11-06 MED ORDER — ROCURONIUM BROMIDE 100 MG/10ML IV SOLN
INTRAVENOUS | Status: DC | PRN
  Administered 2014-11-06: 5 mg via INTRAVENOUS

## 2014-11-06 MED ORDER — OXYCODONE-ACETAMINOPHEN 5-325 MG PO TABS: 1.0000 | ORAL_TABLET | Freq: Four times a day (QID) | ORAL | Status: DC | PRN

## 2014-11-06 MED ORDER — DEXAMETHASONE SODIUM PHOSPHATE 4 MG/ML IJ SOLN
INTRAMUSCULAR | Status: AC
  Filled 2014-11-06: qty 1

## 2014-11-06 MED ORDER — GLYCINE 1.5 % IR SOLN
Status: DC | PRN
  Administered 2014-11-06: 3000 mL

## 2014-11-06 MED ORDER — FENTANYL CITRATE 0.05 MG/ML IJ SOLN
25.0000 ug | INTRAMUSCULAR | Status: DC | PRN
  Administered 2014-11-06: 50 ug via INTRAVENOUS

## 2014-11-06 MED ORDER — LIDOCAINE HCL 2 % IJ SOLN
INTRAMUSCULAR | Status: DC | PRN
  Administered 2014-11-06: 2 mL

## 2014-11-06 MED ORDER — MEPERIDINE HCL 25 MG/ML IJ SOLN: 6.2500 mg | INTRAMUSCULAR | Status: DC | PRN

## 2014-11-06 MED ORDER — SODIUM CHLORIDE 0.9 % IR SOLN
Status: DC | PRN
  Administered 2014-11-06: 3000 mL

## 2014-11-06 MED ORDER — NEOSTIGMINE METHYLSULFATE 10 MG/10ML IV SOLN
INTRAVENOUS | Status: AC
  Filled 2014-11-06: qty 1

## 2014-11-06 MED ORDER — SUCCINYLCHOLINE CHLORIDE 20 MG/ML IJ SOLN
INTRAMUSCULAR | Status: DC | PRN
  Administered 2014-11-06: 140 mg via INTRAVENOUS

## 2014-11-06 MED ORDER — SCOPOLAMINE 1 MG/3DAYS TD PT72: 1.0000 | MEDICATED_PATCH | Freq: Once | TRANSDERMAL | Status: DC

## 2014-11-06 MED ORDER — ONDANSETRON HCL 4 MG/2ML IJ SOLN
INTRAMUSCULAR | Status: DC | PRN
  Administered 2014-11-06: 4 mg via INTRAVENOUS

## 2014-11-06 MED ORDER — MIDAZOLAM HCL 2 MG/2ML IJ SOLN
INTRAMUSCULAR | Status: AC
  Filled 2014-11-06: qty 2

## 2014-11-06 MED ORDER — ALBUTEROL SULFATE HFA 108 (90 BASE) MCG/ACT IN AERS
INHALATION_SPRAY | RESPIRATORY_TRACT | Status: AC
Start: 2014-11-06 — End: 2014-11-06
  Filled 2014-11-06: qty 6.7

## 2014-11-06 MED ORDER — LIDOCAINE HCL (PF) 1 % IJ SOLN
INTRAMUSCULAR | Status: AC
  Filled 2014-11-06: qty 5

## 2014-11-06 MED ORDER — LIDOCAINE HCL (CARDIAC) 20 MG/ML IV SOLN
INTRAVENOUS | Status: DC | PRN
  Administered 2014-11-06: 100 mg via INTRAVENOUS

## 2014-11-06 MED ORDER — KETOROLAC TROMETHAMINE 30 MG/ML IJ SOLN
INTRAMUSCULAR | Status: AC
Start: 2014-11-06 — End: 2014-11-06
  Filled 2014-11-06: qty 1

## 2014-11-06 SURGICAL SUPPLY — 16 items
CANISTER SUCT 3000ML (MISCELLANEOUS) ×2 IMPLANT
CATH ROBINSON RED A/P 16FR (CATHETERS) ×2 IMPLANT
CLOTH BEACON ORANGE TIMEOUT ST (SAFETY) ×2 IMPLANT
CONTAINER PREFILL 10% NBF 60ML (FORM) ×4 IMPLANT
DILATOR CANAL MILEX (MISCELLANEOUS) IMPLANT
GLOVE BIO SURGEON STRL SZ7 (GLOVE) ×2 IMPLANT
GLOVE BIOGEL PI IND STRL 7.0 (GLOVE) ×1 IMPLANT
GLOVE BIOGEL PI INDICATOR 7.0 (GLOVE) ×1
GOWN STRL REUS W/TWL LRG LVL3 (GOWN DISPOSABLE) ×4 IMPLANT
LOOP ANGLED CUTTING 22FR (CUTTING LOOP) ×2 IMPLANT
PACK VAGINAL MINOR WOMEN LF (CUSTOM PROCEDURE TRAY) ×2 IMPLANT
PAD OB MATERNITY 4.3X12.25 (PERSONAL CARE ITEMS) ×2 IMPLANT
TOWEL OR 17X24 6PK STRL BLUE (TOWEL DISPOSABLE) ×4 IMPLANT
TUBING AQUILEX INFLOW (TUBING) ×2 IMPLANT
TUBING AQUILEX OUTFLOW (TUBING) ×2 IMPLANT
WATER STERILE IRR 1000ML POUR (IV SOLUTION) ×2 IMPLANT

## 2014-11-06 NOTE — Anesthesia Postprocedure Evaluation (Signed)
  Anesthesia Post-op Note  Patient: Kelli Abbott  Procedure(s) Performed: Procedure(s): DILATATION AND CURETTAGE /HYSTEROSCOPY with resection (N/A)  Patient Location: PACU  Anesthesia Type:General  Level of Consciousness: awake  Airway and Oxygen Therapy: Patient Spontanous Breathing and Patient connected to nasal cannula oxygen  Post-op Pain: mild  Post-op Assessment: Post-op Vital signs reviewed, Patient's Cardiovascular Status Stable, Respiratory Function Stable and Patent Airway  Post-op Vital Signs: Reviewed and stable  Last Vitals:  Filed Vitals:   11/06/14 0912  BP: 158/82  Pulse:   Temp:   Resp:     Complications: No apparent anesthesia complications

## 2014-11-06 NOTE — Transfer of Care (Signed)
Immediate Anesthesia Transfer of Care Note  Patient: Kelli Abbott  Procedure(s) Performed: Procedure(s): DILATATION AND CURETTAGE /HYSTEROSCOPY with resection (N/A)  Patient Location: PACU  Anesthesia Type:General  Level of Consciousness: awake, alert , oriented and patient cooperative  Airway & Oxygen Therapy: Patient Spontanous Breathing and Patient connected to nasal cannula oxygen  Post-op Assessment: Report given to RN and Post -op Vital signs reviewed and stable  Post vital signs: Reviewed and stable  Last Vitals:  Filed Vitals:   11/06/14 0912  BP: 158/82  Pulse:   Temp:   Resp:     Complications: No apparent anesthesia complications

## 2014-11-06 NOTE — H&P (Signed)
History of Present Illness  General:  Pt presents for Hysteroscopy/D&C for postmenopausal bleeding. Previously on Tamoxifen when bleeding started less than 2 years ago. D&C previously recommended but pt refused.  Pt had bleeding for 6 days within the last 3 weeks. Foul odor. Has some clots, looks like liver.   Current Medications  Taking   Vitamin D 1000 UNIT Tablet 1 tablet Once a day   Iron 325 (65 Fe) MG Tablet 1 tablet Once a day   Hydrocodone-Acetaminophen 10-325 MG Tablet 1 tablet as needed three times a day with pain clinic, Notes: with pain clinic   Tizanidine HCl 4 MG Tablet 1/2- 1 tablet as needed at bedtime as needed   Meloxicam 15 MG Tablet 1 tablet Once a day   Duloxetine HCl 60 MG Delayed Release Pellet in Capsule 1 capsule Once a day   VESIcare(Solifenacin Succinate) 5 MG Tablet Take 1 tablet by mouth once a day   BuPROPion HCl ER (XL) 150 MG Tablet Extended Release 24 Hour 1 tablet in the morning Once a day   Diovan HCT(Valsartan-Hydrochlorothiazide) 320-25mg  Tablet 1 tablet qd   Not-Taking/PRN   Provera(Medroxyprogesterone) 10 MG Tablet 1 table One tablet daiy   Discontinued   Cytotec(Misoprostol) 200 MCG Tablet 1 tablet qhs night before procedure and am of procedure   Medication List reviewed and reconciled with the patient    Past Medical History  Hypertension  Depression  Anemia  OA, Hips  Hypercalcemia,mild  Obesity  Anxeity  Breast CA, s/p R breast Mastectomy, 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 Ralene Ok.  Tob use , quit in 9/09  Chronic Back Pain Dr Gwyneth Sprout,   Urine incontinence  Pain management- [facility]- Dr Paralee Cancel; Dr Sheron Nightingale- Hydrocodone given by them   Surgical History  Rt Total hip;Dr.Aluisio 01/2004  Rt.breast needle segmental mastectomy and sentinel node procedure Dx Breast cancer 04/2008  Reexcision rt breast 05/2008   Family History  Father: alive 72 yrs, Hypertension,  diagnosed with HTN  Mother: deceased 79 yrs, Seizure complication  Brother 1: alive  Brother2: alive  Paternal aunt: Breast cancer, diagnosed with Breast Ca  2 brother(s) .   Cousin had Colon Cancer Significant GI family history: None.   Social History  General:  Tobacco use  cigarettes: Former smoker Quit in year 2009 Pack-year Hx: 3 Tobacco history last updated 11/04/2014 no Smoking, quit Dec 2009.  Alcohol: yes, beer, wine, occasionally.  Caffeine: yes.  no Recreational drug use.  no Diet.  Exercise: yes, walks.  Occupation: unemployed, Electrical engineer; now retired S/P hip replacement living in [employer] apt.  Marital Status: Divorced.  Children: girls, 1.  Seat belt use: yes.    Gyn History  Sexual activity currently sexually active.  LMP 10/15/14 and lasted 6-7 days.  Denies H/O Birth control.  Last pap smear date 05/24/12, all negative.  Last mammogram date 05/17/14.  Denies H/O Abnormal pap smear.  STD Herpes.  Menarche 5.    OB History  Number of pregnancies 1.  Pregnancy # 1 live birth, vaginal delivery, girl.    Allergies  N.K.D.A.   Hospitalization/Major Diagnostic Procedure  see surgery    Review of Systems  Denies fever/chills, chest pain, SOB, headaches, numbness/tingling. No h/o complication with anesthesia, bleeding disorders or blood clots.   Vital Signs  Wt 324, Wt change -5 lb, Ht 64, BMI 55.61, BP sitting 140/88.   Physical Examination  GENERAL:  Patient appears alert  and oriented.  General Appearance: well-appearing, well-developed, no acute distress.  Speech: clear.  LUNGS:  Auscultation: no wheezing/rhonchi/rales. CTA bilaterally.  HEART:  Heart sounds: normal. RRR. no murmur.  ABDOMEN:  General: soft nontender, nondistended, no masses.  FEMALE GENITOURINARY:  Pelvic Not examined.  EXTREMITIES:  General: No edema or calf tenderness.     Assessments   1. Pre-operative clearance - Z01.818 (Primary)   2. Abnormal uterine  and vaginal bleeding, unspecified - N93.9   Treatment  1. Pre-operative clearance  Notes: R/B/A and rationale of procedure discussed with pt. Bleeding, infection, uterine perforation reviewed. Cytotec prescribed due to h/o cervical stenosis. All questions answered.    2. Abnormal uterine and vaginal bleeding, unspecified  Refill Cytotec Tablet, 200 MCG, 1 tablet, orally, qhs night before procedure and am of procedure, 1 days, 2, Refills 0   Follow Up  2 Weeks post op

## 2014-11-06 NOTE — Telephone Encounter (Signed)
Called and left a message with dr Lindi Adie appointment

## 2014-11-06 NOTE — Brief Op Note (Signed)
11/06/2014  12:15 PM  PATIENT:  Kelli Abbott  59 y.o. female  PRE-OPERATIVE DIAGNOSIS:  N95.0  PostMenopausal Bleeding  POST-OPERATIVE DIAGNOSIS:  post menopausal bleeding, endometrial clots  PROCEDURE:  Procedure(s): DILATATION AND CURETTAGE /HYSTEROSCOPY with resection (N/A) Hysteroscopic myomectomy  SURGEON:  Surgeon(s) and Role:    * Thurnell Lose, MD - Primary  PHYSICIAN ASSISTANT: None  ASSISTANTS: Technician   ANESTHESIA:   general  EBL:  Total I/O In: 1300 [I.V.:1300] Out: 310 [Urine:300; Blood:10] Hysteroscopy deficit ~1000 (~300 NS, 600 Glycine) BLOOD ADMINISTERED:none  DRAINS: none   LOCAL MEDICATIONS USED:  LIDOCAINE   SPECIMEN:  Source of Specimen:  Direct endometrial biopsies, endometrial currettings, submucosal fibroid  DISPOSITION OF SPECIMEN:  PATHOLOGY  COUNTS:  YES  TOURNIQUET:  * No tourniquets in log *  DICTATION: .Other Dictation: Dictation Number [id number]  PLAN OF CARE: Discharge to home after PACU  PATIENT DISPOSITION:  PACU - hemodynamically stable.   Delay start of Pharmacological VTE agent (>24hrs) due to surgical blood loss or risk of bleeding: yes

## 2014-11-06 NOTE — Interval H&P Note (Signed)
History and Physical Interval Note:  11/06/2014 10:14 AM  Kelli Abbott  has presented today for surgery, with the diagnosis of N95.0  PostMenopausal Bleeding  The various methods of treatment have been discussed with the patient and family. After consideration of risks, benefits and other options for treatment, the patient has consented to  Procedure(s): DILATATION AND CURETTAGE /HYSTEROSCOPY (N/A) as a surgical intervention .  The patient's history has been reviewed, patient examined, no change in status, stable for surgery.  I have reviewed the patient's chart and labs.  Questions were answered to the patient's satisfaction.     Simona Huh, Herby Amick

## 2014-11-06 NOTE — Anesthesia Procedure Notes (Signed)
Procedure Name: Intubation Date/Time: 11/06/2014 10:30 AM Performed by: Bufford Spikes Pre-anesthesia Checklist: Patient identified, Timeout performed, Emergency Drugs available, Suction available and Patient being monitored Patient Re-evaluated:Patient Re-evaluated prior to inductionOxygen Delivery Method: Circle system utilized Preoxygenation: Pre-oxygenation with 100% oxygen (5 min) Intubation Type: IV induction and Rapid sequence Ventilation: Mask ventilation without difficulty Laryngoscope Size: Mac and 3 (DL and intubation by Dr. Tresa Moore) Grade View: Grade II Tube type: Oral Tube size: 7.0 mm Number of attempts: 1 Placement Confirmation: ETT inserted through vocal cords under direct vision,  breath sounds checked- equal and bilateral and positive ETCO2 Secured at: 20 cm Tube secured with: Tape Dental Injury: Teeth and Oropharynx as per pre-operative assessment  Difficulty Due To: Difficult Airway- due to large tongue and Difficulty was anticipated Future Recommendations: Recommend- induction with short-acting agent, and alternative techniques readily available

## 2014-11-06 NOTE — Discharge Instructions (Addendum)
Myomectomy, Care After Refer to this sheet in the next few weeks. These instructions provide you with information on caring for yourself after your procedure. Your health care provider may also give you more specific instructions. Your treatment has been planned according to current medical practices, but problems sometimes occur. Call your health care provider if you have any problems or questions after your procedure. WHAT TO EXPECT AFTER THE PROCEDURE After your procedure, it is typical to have the following:  Pain in your abdomen, especially at any incision sites. You will be given pain medicine to control the pain.  Tiredness. This is a normal part of the recovery process. Your energy level will return to normal over the next several weeks.  Constipation.  Vaginal bleeding. This is normal and should stop after 1-2 weeks. HOME CARE INSTRUCTIONS   Only take over-the-counter or prescription medicines as directed by your health care provider. Avoid aspirin because it can cause bleeding.  Do not douche, use tampons, or have sexual intercourse until given permission by your health care provider.  Remove or change any bandages (dressings) as directed by your health care provider.  Take showers instead of baths as directed by your health care provider.  You will probably be able to go back to your normal routine after a few days. Do not do anything that requires extra effort until your health care provider says it is okay. Do not lift anything heavier than 15 pounds (6.8 kg) until your health care provider approves.  Walk daily but take frequent rest breaks if you tire easily.  Continue to practice deep breathing and coughing. If it hurts to cough, try holding a pillow against your belly as you cough.  If you become constipated, you may:  Use a mild laxative if your health care provider approves.  Add more fruit and bran to your diet.  Drink enough fluids to keep your urine clear or  pale yellow.  Do not drink alcohol.  Do not drive until your health care provider approves.  Have someone help you at home for 1 week or until you can do your own household activities.  Follow up with your health care provider as directed. SEEK MEDICAL CARE IF:  You have a fever.  You have increasing abdominal pain that is not relieved with medicine.  You have nausea, vomiting, or diarrhea.  You have pain when you urinate, or you have blood in your urine.  You have a rash on your body.  You have pain or redness where your IV access tube was inserted.  You have redness, swelling, or any kind of drainage from an incision. SEEK IMMEDIATE MEDICAL CARE IF:   You have weakness or lightheadedness.  You have pain, swelling, or redness in your legs.  You have chest pain.  You faint.  You have shortness of breath.  You have heavy vaginal bleeding.  Your incision is opening up. Document Released: 12/02/2010 Document Revised: 05/02/2013 Document Reviewed: 02/21/2013 [facility] Patient Information 2015 [place], [place]. This information is not intended to replace advice given to you by your health care provider. Make sure you discuss any questions you have with your health care provider. DISCHARGE INSTRUCTIONS: D&C / D&E The following instructions have been prepared to help you care for yourself upon your return home.   Personal hygiene:  Use sanitary pads for vaginal drainage, not tampons.  Shower the day after your procedure.  NO tub baths, pools or Jacuzzis for 2-3 weeks.  Wipe front to  back after using the bathroom.  Activity and limitations:  Do NOT drive or operate any equipment for 24 hours. The effects of anesthesia are still present and drowsiness may result.  Do NOT rest in bed all day.  Walking is encouraged.  Walk up and down stairs slowly.  You may resume your normal activity in one to two days or as indicated by your physician.  Sexual activity: NO  intercourse for at least 2 weeks after the procedure, or as indicated by your physician.  Diet: Eat a light meal as desired this evening. You may resume your usual diet tomorrow.  Return to work: You may resume your work activities in one to two days or as indicated by your doctor.  What to expect after your surgery: Expect to have vaginal bleeding/discharge for 2-3 days and spotting for up to 10 days. It is not unusual to have soreness for up to 1-2 weeks. You may have a slight burning sensation when you urinate for the first day. Mild cramps may continue for a couple of days. You may have a regular period in 2-6 weeks.  Call your doctor for any of the following:  Excessive vaginal bleeding, saturating and changing one pad every hour.  Inability to urinate 6 hours after discharge from hospital.  Pain not relieved by pain medication.  Fever of 100.4 F or greater.  Unusual vaginal discharge or odor.   Call for an appointment:    Patients signature: ______________________  Nurses signature ________________________  Support person's signature_______________________

## 2014-11-07 ENCOUNTER — Encounter (HOSPITAL_COMMUNITY): Payer: Self-pay | Admitting: Obstetrics and Gynecology

## 2014-11-07 NOTE — Op Note (Signed)
[name]  ROSALEA, WITHROW                 ACCOUNT NO.:  [id number]  MEDICAL RECORD NO.:  [id number]  LOCATION:  [facility]                          FACILITY:  [facility]  PHYSICIAN:  Jola Schmidt, MD   DATE OF BIRTH:  1956/06/04  DATE OF PROCEDURE:  11/06/2014 DATE OF DISCHARGE:  11/06/2014                              OPERATIVE REPORT   PREOPERATIVE DIAGNOSIS:  Postmenopausal bleeding.  PROCEDURE:  Hysteroscopy, dilation and curettage with hysteroscopic myomectomy.  SURGEON:  Jola Schmidt, MD.  ASSISTANT:  Technician.  ANESTHESIA:  General.  ESTIMATED BLOOD LOSS:  10.  BLOOD ADMINISTERED:  None.  DRAINS:  None.  MEDICATIONS:  Lidocaine as local medication.  SPECIMEN:  Directed endometrial biopsies, endometrial curettings, submucosal fibroid. Disposition of specimen to Pathology.  COMPLICATIONS:  None.  DISPOSITION:  PACU, hemodynamically stable.  FINDINGS:  Uterus sounded to 9 cm.  There were approximately 3-4 endometrial clots that were identified at the fundus.  They were dark and hemorrhagic.  They were not raised fungating lesion at all and no excessive bleeding at the time of biopsy.  Submucosal fibroid noted on the right lower uterine segment right above the cervical os.  DESCRIPTION OF PROCEDURE:  Ms. Maultsby was identified in the holding area. She was then taken to the operating room with IV running.  She was placed in the dorsal supine position, underwent general endotracheal anesthesia without complication.  She was then prepped and draped in normal sterile fashion.  Time-out was taken and the patient's SCDs were on in operating.  Graves speculum was inserted.  The cervix was initially dilated up to 6.5 Hank. Hysteroscope was then advanced initially against because the fibroid was in the lower uterine segment, it was not visualized.  I did direct biopsies of these little endometrial clots.  When I biopsied the first one, it looked like a dark, like old endometrial  clotting cyst.  I then did a curettage of all 4 quadrants of the uterus until it was a gritty cry, minimal to moderate tissue return.  Once I went back in with a hysteroscope for a last look, the fibroid was noted.  I then used the resectoscope to shave down 75% of the fibroid.  Due to the patient's body habitus with the buttocks, I was not able to reach the upper edge of the fibroid, but significant blood supply was disrupted and just a small portion remained of the fibroid.  At one point, because of the patient's body habitus and use of the resectoscope, I did have to use a [place] Fore along the nano weighted speculum and a Deaver to visualize the cervix.  Prior to the myomectomy, I did inject 6 mL of lidocaine paracervically.  I also used a single-tooth tenaculum, which I injected with lidocaine prior to placement.  All instruments were removed from the uterus.  Single-tooth tenaculum was removed and the tenaculum site was hemostatic.  There was no bleeding from the cervical os and the deficit of fluid loss, normal saline was approximately 200-300 and then glycine was approximately 500-600, we estimated approximately a 1000.     Jola Schmidt, MD     EBV/MEDQ  D:  11/06/2014  T:  11/06/2014  Job:  [id number]

## 2014-12-17 ENCOUNTER — Other Ambulatory Visit: Payer: Self-pay | Admitting: *Deleted

## 2014-12-17 DIAGNOSIS — C50911 Malignant neoplasm of unspecified site of right female breast: Secondary | ICD-10-CM

## 2014-12-18 ENCOUNTER — Other Ambulatory Visit: Payer: PRIVATE HEALTH INSURANCE

## 2014-12-18 ENCOUNTER — Ambulatory Visit: Payer: Medicare Other | Admitting: Hematology and Oncology

## 2014-12-18 NOTE — Assessment & Plan Note (Signed)
Right breast invasive ductal carcinoma diagnosed 10 2009 status post lumpectomy 05/14/2008 grade 1 IDC 2 cm 0/3 lymph nodes, ER 99%, PR 99%, HER-2 negative, Ki-67 10% T1 cN0 M0 stage I a status post radiation and tamoxifen completed 2015 (patient had extensive vaginal bleeding from tamoxifen therapy)  Breast Cancer Surveillance: 1. Breast exam 12/18/2014: Normal 2. Mammogram 05/17/2014 No abnormalities. Postsurgical changes. Breast Density Category B. I recommended that she get 3-D mammograms for surveillance.  Return to clinic in 1 year for follow-up survivorship clinic Discussed the differences between different breast density categories.

## 2015-01-03 ENCOUNTER — Other Ambulatory Visit: Payer: Self-pay

## 2015-01-03 ENCOUNTER — Telehealth: Payer: Self-pay | Admitting: Hematology and Oncology

## 2015-01-03 NOTE — Telephone Encounter (Signed)
Called patient with appointment and i have mailed her a new calendar

## 2015-02-07 ENCOUNTER — Ambulatory Visit (HOSPITAL_BASED_OUTPATIENT_CLINIC_OR_DEPARTMENT_OTHER): Payer: Medicare Other | Admitting: Hematology and Oncology

## 2015-02-07 ENCOUNTER — Other Ambulatory Visit (HOSPITAL_BASED_OUTPATIENT_CLINIC_OR_DEPARTMENT_OTHER): Payer: Medicare Other

## 2015-02-07 ENCOUNTER — Telehealth: Payer: Self-pay | Admitting: Hematology and Oncology

## 2015-02-07 VITALS — BP 130/91 | HR 90 | Temp 98.7°F | Resp 18 | Ht 63.0 in | Wt 324.9 lb

## 2015-02-07 DIAGNOSIS — C50911 Malignant neoplasm of unspecified site of right female breast: Secondary | ICD-10-CM

## 2015-02-07 DIAGNOSIS — C50411 Malignant neoplasm of upper-outer quadrant of right female breast: Secondary | ICD-10-CM

## 2015-02-07 LAB — COMPREHENSIVE METABOLIC PANEL (CC13)
ALT: 13 U/L (ref 0–55)
AST: 16 U/L (ref 5–34)
Albumin: 3.3 g/dL — ABNORMAL LOW (ref 3.5–5.0)
Alkaline Phosphatase: 96 U/L (ref 40–150)
Anion Gap: 8 mEq/L (ref 3–11)
BUN: 10.1 mg/dL (ref 7.0–26.0)
CALCIUM: 10.1 mg/dL (ref 8.4–10.4)
CO2: 29 mEq/L (ref 22–29)
Chloride: 105 mEq/L (ref 98–109)
Creatinine: 0.8 mg/dL (ref 0.6–1.1)
Glucose: 101 mg/dl (ref 70–140)
Potassium: 3.4 mEq/L — ABNORMAL LOW (ref 3.5–5.1)
Sodium: 141 mEq/L (ref 136–145)
TOTAL PROTEIN: 7.4 g/dL (ref 6.4–8.3)
Total Bilirubin: 0.45 mg/dL (ref 0.20–1.20)

## 2015-02-07 LAB — CBC WITH DIFFERENTIAL/PLATELET
BASO%: 0.1 % (ref 0.0–2.0)
Basophils Absolute: 0 10*3/uL (ref 0.0–0.1)
EOS%: 1.5 % (ref 0.0–7.0)
Eosinophils Absolute: 0.2 10*3/uL (ref 0.0–0.5)
HCT: 39.1 % (ref 34.8–46.6)
HGB: 12.4 g/dL (ref 11.6–15.9)
LYMPH%: 20.6 % (ref 14.0–49.7)
MCH: 26.3 pg (ref 25.1–34.0)
MCHC: 31.8 g/dL (ref 31.5–36.0)
MCV: 82.5 fL (ref 79.5–101.0)
MONO#: 1.2 10*3/uL — AB (ref 0.1–0.9)
MONO%: 9.6 % (ref 0.0–14.0)
NEUT#: 8.7 10*3/uL — ABNORMAL HIGH (ref 1.5–6.5)
NEUT%: 68.2 % (ref 38.4–76.8)
PLATELETS: 318 10*3/uL (ref 145–400)
RBC: 4.74 10*6/uL (ref 3.70–5.45)
RDW: 17.6 % — AB (ref 11.2–14.5)
WBC: 12.8 10*3/uL — AB (ref 3.9–10.3)
lymph#: 2.6 10*3/uL (ref 0.9–3.3)

## 2015-02-07 NOTE — Assessment & Plan Note (Signed)
Invasive ductal carcinoma of the right breast diagnosed 04/26/2008. S/P lumpectomy 05/14/08. low-grade 2 cm size.  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 Margins were negative after reexcision on 05/30/2008. Three sentinel lymph nodes were negative. ER/PR 99%. Ki67 10%. HER-2/neu was 2+, but negative by CISH. Oncotype DX score 14 (ROR 9%). Tumor stage was T1c N0. S/P Radiation12/28/2009 through 09/11/2008.Started tamoxifen 20 mg daily February 2010  Tamoxifen toxicities:  Breast Cancer Surveillance: 1. Breast exam 02/07/2015: Normal 2. Mammogram 05/17/2014 No abnormalities. Postsurgical changes. Breast Density Category B. I recommended that she get 3-D mammograms for surveillance. Discussed the differences between different breast density categories.  Return to clinic in 1 year for follow-up

## 2015-02-07 NOTE — Telephone Encounter (Signed)
Mailed calendar. °

## 2015-02-07 NOTE — Progress Notes (Signed)
Patient Care Team: Wenda Low, MD as PCP - General (Internal Medicine)  DIAGNOSIS: No matching staging information was found for the patient.  SUMMARY OF ONCOLOGIC HISTORY:   Breast cancer, right breast   04/26/2008 Initial Diagnosis Right breast invasive ductal carcinoma upper quadrant, lost to follow-up until 10/03/2012   05/14/2008 Surgery Right breast lumpectomy: IDC grade 1, 0/3 sentinel nodes, low-grade DCIS present at posterior margin, ER 99%, PR 99%, Ki-67 10%, HER-2 2+ equivocal,CISH negative   05/30/2008 Surgery Margin reexcision: Benign   07/22/2008 - 09/11/2008 Radiation Therapy Adjuvant radiation therapy   09/19/2008 - 12/18/2013 Anti-estrogen oral therapy Tamoxifen 20 mg daily    CHIEF COMPLIANT: Follow-up on tamoxifen therapy  INTERVAL HISTORY: Kelli Abbott is a 59 year old lady with above-mentioned history of stage II breast cancer treated with lumpectomy radiation and is currently on tamoxifen. She is tolerating tamoxifen well except for severe hot flashes that she's had for the past 6 years. 3 months ago she has noticed increased menstrual bleeding. She had seen a gynecologist but performed a D&C and that had solved the problem. She is thinking of undergoing a hysterectomy.  REVIEW OF SYSTEMS:   Constitutional: Denies fevers, chills or abnormal weight loss Eyes: Denies blurriness of vision Ears, nose, mouth, throat, and face: Denies mucositis or sore throat Respiratory: Denies cough, dyspnea or wheezes Cardiovascular: Denies palpitation, chest discomfort or lower extremity swelling Gastrointestinal:  Denies nausea, heartburn or change in bowel habits Skin: Denies abnormal skin rashes Lymphatics: Denies new lymphadenopathy or easy bruising Neurological:Denies numbness, tingling or new weaknesses Behavioral/Psych: Mood is stable, no new changes  Breast:  denies any pain or lumps or nodules in either breasts All other systems were reviewed with the patient and are  negative.  I have reviewed the past medical history, past surgical history, social history and family history with the patient and they are unchanged from previous note.  ALLERGIES:  has No Known Allergies.  MEDICATIONS:  Current Outpatient Prescriptions  Medication Sig Dispense Refill  . acetaminophen (TYLENOL) 325 MG tablet Take 650 mg by mouth every 6 (six) hours as needed for moderate pain.     . Cholecalciferol (VITAMIN D PO) Take 1,000 mg by mouth daily.      . DULoxetine (CYMBALTA) 60 MG capsule Take 1 capsule by mouth daily.    . ferrous sulfate 325 (65 FE) MG tablet Take 325 mg by mouth daily with breakfast.    . HYDROcodone-acetaminophen (NORCO) 10-325 MG per tablet Take 1-2 tablets by mouth every 6 (six) hours as needed for moderate pain or severe pain.    . meloxicam (MOBIC) 15 MG tablet Take 1 tablet by mouth daily.    Marland Kitchen oxyCODONE-acetaminophen (PERCOCET/ROXICET) 5-325 MG per tablet Take 1-2 tablets by mouth every 6 (six) hours as needed for severe pain. 15 tablet 0  . solifenacin (VESICARE) 5 MG tablet Take 5 mg by mouth daily.     . Tapentadol HCl 50 MG TB12 Take 1 tablet by mouth daily as needed (pain).    Marland Kitchen tiZANidine (ZANAFLEX) 4 MG tablet Take 1 tablet by mouth at bedtime.    . valsartan-hydrochlorothiazide (DIOVAN-HCT) 320-25 MG per tablet Take 1 tablet by mouth daily.      No current facility-administered medications for this visit.    PHYSICAL EXAMINATION: ECOG PERFORMANCE STATUS: 1 - Symptomatic but completely ambulatory  Filed Vitals:   02/07/15 1004  BP: 130/91  Pulse: 90  Temp: 98.7 F (37.1 C)  Resp: 18   Filed Weights  02/07/15 1004  Weight: 324 lb 14.4 oz (147.374 kg)    GENERAL:alert, no distress and comfortable SKIN: skin color, texture, turgor are normal, no rashes or significant lesions EYES: normal, Conjunctiva are pink and non-injected, sclera clear OROPHARYNX:no exudate, no erythema and lips, buccal mucosa, and tongue normal  NECK:  supple, thyroid normal size, non-tender, without nodularity LYMPH:  no palpable lymphadenopathy in the cervical, axillary or inguinal LUNGS: clear to auscultation and percussion with normal breathing effort HEART: regular rate & rhythm and no murmurs and no lower extremity edema ABDOMEN:abdomen soft, non-tender and normal bowel sounds Musculoskeletal:no cyanosis of digits and no clubbing  NEURO: alert & oriented x 3 with fluent speech, no focal motor/sensory deficits BREAST: No palpable masses or nodules in either right or left breasts. No palpable axillary supraclavicular or infraclavicular adenopathy no breast tenderness or nipple discharge. (exam performed in the presence of a chaperone)  LABORATORY DATA:  I have reviewed the data as listed   Chemistry      Component Value Date/Time   NA 140 11/05/2014 1600   NA 142 10/03/2012 1046   K 4.4 11/05/2014 1600   K 3.7 10/03/2012 1046   CL 102 11/05/2014 1600   CL 104 10/03/2012 1046   CO2 31 11/05/2014 1600   CO2 28 10/03/2012 1046   BUN 13 11/05/2014 1600   BUN 13.5 10/03/2012 1046   CREATININE 0.79 11/05/2014 1600   CREATININE 0.9 10/03/2012 1046      Component Value Date/Time   CALCIUM 10.0 11/05/2014 1600   CALCIUM 9.6 10/03/2012 1046   ALKPHOS 77 10/03/2012 1046   ALKPHOS 74 01/03/2012 1138   AST 12 10/03/2012 1046   AST 11 01/03/2012 1138   ALT 8 10/03/2012 1046   ALT 8 01/03/2012 1138   BILITOT 0.37 10/03/2012 1046   BILITOT 0.3 01/03/2012 1138       Lab Results  Component Value Date   WBC 12.8* 02/07/2015   HGB 12.4 02/07/2015   HCT 39.1 02/07/2015   MCV 82.5 02/07/2015   PLT 318 02/07/2015   NEUTROABS 8.7* 02/07/2015    ASSESSMENT & PLAN:  Breast cancer, right breast Invasive ductal carcinoma of the right breast diagnosed 04/26/2008. S/P lumpectomy 05/14/08. low-grade 2 cm size.  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 Margins were negative after reexcision on 05/30/2008. Three sentinel lymph nodes  were negative. ER/PR 99%. Ki67 10%. HER-2/neu was 2+, but negative by CISH. Oncotype DX score 14 (ROR 9%). Tumor stage was T1c N0. S/P Radiation12/28/2009 through 09/11/2008.Started tamoxifen 20 mg daily February 2010  Tamoxifen toxicities: 1. Severe hot flashes 2. Recent intermittent uterine bleeding: Her last regular cycle was when she was 59 years old. She is very concerned about risk of uterine cancer with tamoxifen.  I discussed with her that we should send for breast cancer index to determine if she would benefit from extended adjuvant therapy. If she does not need to stay on tamoxifen and then she does not probably need a hysterectomy either. If we find that she needs tamoxifen then she can go ahead with a hysterectomy. I will call her with the results of this test in approximately a month.  Breast Cancer Surveillance: 1. Breast exam 02/07/2015: Normal 2. Mammogram 05/17/2014 No abnormalities. Postsurgical changes. Breast Density Category B. I recommended that she get 3-D mammograms for surveillance. Discussed the differences between different breast density categories.  Return to clinic in 1 year for follow-up    No orders of the defined types were  placed in this encounter.   The patient has a good understanding of the overall plan. she agrees with it. she will call with any problems that may develop before the next visit here.   Rulon Eisenmenger, MD

## 2015-02-10 ENCOUNTER — Encounter: Payer: Self-pay | Admitting: *Deleted

## 2015-02-10 NOTE — Progress Notes (Signed)
Ordered BCI test per Dr. Lindi Adie order.  Faxed requisition to [facility].  Informed pathology.

## 2015-02-18 ENCOUNTER — Encounter (HOSPITAL_COMMUNITY): Payer: Self-pay

## 2015-04-11 ENCOUNTER — Other Ambulatory Visit: Payer: Self-pay | Admitting: Internal Medicine

## 2015-04-11 DIAGNOSIS — Z853 Personal history of malignant neoplasm of breast: Secondary | ICD-10-CM

## 2015-05-21 ENCOUNTER — Ambulatory Visit
Admission: RE | Admit: 2015-05-21 | Discharge: 2015-05-21 | Disposition: A | Discharge status: Home / Self Care | Payer: Medicare Other | Source: Ambulatory Visit | Attending: Internal Medicine | Admitting: Internal Medicine

## 2015-05-21 DIAGNOSIS — Z853 Personal history of malignant neoplasm of breast: Secondary | ICD-10-CM

## 2015-06-02 ENCOUNTER — Other Ambulatory Visit (HOSPITAL_COMMUNITY)
Admission: RE | Admit: 2015-06-02 | Discharge: 2015-06-02 | Disposition: A | Discharge status: Home / Self Care | Payer: Medicare Other | Source: Ambulatory Visit | Attending: Obstetrics and Gynecology | Admitting: Obstetrics and Gynecology

## 2015-06-02 ENCOUNTER — Other Ambulatory Visit: Payer: Self-pay | Admitting: Obstetrics and Gynecology

## 2015-06-02 DIAGNOSIS — Z1151 Encounter for screening for human papillomavirus (HPV): Secondary | ICD-10-CM | POA: Insufficient documentation

## 2015-06-02 DIAGNOSIS — Z01411 Encounter for gynecological examination (general) (routine) with abnormal findings: Secondary | ICD-10-CM | POA: Insufficient documentation

## 2015-06-03 LAB — CYTOLOGY - PAP

## 2015-06-05 ENCOUNTER — Other Ambulatory Visit: Payer: Self-pay | Admitting: Anesthesiology

## 2015-06-05 ENCOUNTER — Ambulatory Visit
Admission: RE | Admit: 2015-06-05 | Discharge: 2015-06-05 | Disposition: A | Discharge status: Home / Self Care | Payer: Medicare Other | Source: Ambulatory Visit | Attending: Anesthesiology | Admitting: Anesthesiology

## 2015-06-05 DIAGNOSIS — M545 Low back pain: Secondary | ICD-10-CM

## 2015-06-13 ENCOUNTER — Other Ambulatory Visit: Payer: Self-pay | Admitting: Pain Medicine

## 2015-06-13 DIAGNOSIS — M545 Low back pain: Secondary | ICD-10-CM

## 2015-07-04 ENCOUNTER — Ambulatory Visit
Admission: RE | Admit: 2015-07-04 | Discharge: 2015-07-04 | Disposition: A | Discharge status: Home / Self Care | Payer: Medicare Other | Source: Ambulatory Visit | Attending: Pain Medicine | Admitting: Pain Medicine

## 2015-07-04 DIAGNOSIS — M545 Low back pain: Secondary | ICD-10-CM

## 2015-10-22 ENCOUNTER — Other Ambulatory Visit: Payer: Self-pay | Admitting: Internal Medicine

## 2015-10-22 DIAGNOSIS — R921 Mammographic calcification found on diagnostic imaging of breast: Secondary | ICD-10-CM

## 2015-11-19 ENCOUNTER — Other Ambulatory Visit: Payer: Self-pay | Admitting: Internal Medicine

## 2015-11-19 ENCOUNTER — Ambulatory Visit
Admission: RE | Admit: 2015-11-19 | Discharge: 2015-11-19 | Disposition: A | Discharge status: Home / Self Care | Payer: Medicare Other | Source: Ambulatory Visit | Attending: Internal Medicine | Admitting: Internal Medicine

## 2015-11-19 DIAGNOSIS — R921 Mammographic calcification found on diagnostic imaging of breast: Secondary | ICD-10-CM

## 2015-11-21 ENCOUNTER — Other Ambulatory Visit: Payer: Self-pay | Admitting: Internal Medicine

## 2015-11-21 DIAGNOSIS — R921 Mammographic calcification found on diagnostic imaging of breast: Secondary | ICD-10-CM

## 2015-11-24 ENCOUNTER — Ambulatory Visit
Admission: RE | Admit: 2015-11-24 | Discharge: 2015-11-24 | Disposition: A | Discharge status: Home / Self Care | Payer: Medicare Other | Source: Ambulatory Visit | Attending: Internal Medicine | Admitting: Internal Medicine

## 2015-11-24 DIAGNOSIS — R921 Mammographic calcification found on diagnostic imaging of breast: Secondary | ICD-10-CM

## 2015-11-24 HISTORY — PX: BREAST BIOPSY: SHX20

## 2015-12-09 ENCOUNTER — Ambulatory Visit (HOSPITAL_COMMUNITY)
Admission: EM | Admit: 2015-12-09 | Discharge: 2015-12-09 | Disposition: A | Discharge status: Home / Self Care | Payer: Medicare Other | Attending: Family Medicine | Admitting: Family Medicine

## 2015-12-09 ENCOUNTER — Encounter (HOSPITAL_COMMUNITY): Payer: Self-pay | Admitting: *Deleted

## 2015-12-09 DIAGNOSIS — G894 Chronic pain syndrome: Secondary | ICD-10-CM

## 2015-12-09 MED ORDER — TRAMADOL HCL 50 MG PO TABS: 50.0000 mg | ORAL_TABLET | Freq: Four times a day (QID) | ORAL | Status: DC | PRN

## 2015-12-09 NOTE — Discharge Instructions (Signed)
We cannot give anymore pain medicine, you must see pain management center.

## 2015-12-09 NOTE — ED Notes (Signed)
Pt    Given  rx  For  trammadol

## 2015-12-09 NOTE — ED Provider Notes (Signed)
CSN: [id number]     Arrival date & time 12/09/15  1651 History   First MD Initiated Contact with Patient 12/09/15 1847     Chief Complaint  Patient presents with  . Back Pain   (Consider location/radiation/quality/duration/timing/severity/associated sxs/prior Treatment) Patient is a 60 y.o. female presenting with back pain. The history is provided by the patient.  Back Pain Location:  Gluteal region Quality:  Stabbing Radiates to:  Does not radiate Pain severity:  Moderate Onset quality:  Gradual Progression:  Worsening Chronicity:  Chronic (pt changing pain management mdas prev has left, appt uncertain.also with recent breast bx also having pain.) Context: emotional stress     Past Medical History  Diagnosis Date  . Breast lump   . Hypertension   . Skin lesions, generalized   . Breast nodule     hypoechoic in right breast  . Cancer ([place])     breast  . Breast cancer ([place])   . Arthritis     Osteoarthritis  . Bursitis of shoulder region    Past Surgical History  Procedure Laterality Date  . Breast surgery  2009    R breast reexcision of margins  . Total hip arthroplasty      right  . Hysteroscopy w/d&c N/A 11/06/2014    Procedure: DILATATION AND CURETTAGE /HYSTEROSCOPY with resection and myomectomy;  Surgeon: Thurnell Lose, MD;  Location: [facility];  Service: Gynecology;  Laterality: N/A;   Family History  Problem Relation Age of Onset  . Hypertension Mother   . Hypertension Father   . Hypertension Brother   . Cancer Paternal Aunt    Social History  Substance Use Topics  . Smoking status: Former Smoker -- 1.00 packs/day for 20 years    Types: Cigarettes    Quit date: 04/26/2008  . Smokeless tobacco: Never Used  . Alcohol Use: Yes     Comment: less than 1/wk   OB History    No data available     Review of Systems  Constitutional: Negative.   Musculoskeletal: Positive for back pain and gait problem.  Skin: Negative.   All other systems reviewed and are  negative.   Allergies  Review of patient's allergies indicates no known allergies.  Home Medications   Prior to Admission medications   Medication Sig Start Date End Date Taking? Authorizing Provider  acetaminophen (TYLENOL) 325 MG tablet Take 650 mg by mouth every 6 (six) hours as needed for moderate pain.     Historical Provider, MD  Cholecalciferol (VITAMIN D PO) Take 1,000 mg by mouth daily.      Historical Provider, MD  DULoxetine (CYMBALTA) 60 MG capsule Take 1 capsule by mouth daily. 10/08/13   Historical Provider, MD  ferrous sulfate 325 (65 FE) MG tablet Take 325 mg by mouth daily with breakfast.    Historical Provider, MD  HYDROcodone-acetaminophen (NORCO) 10-325 MG per tablet Take 1-2 tablets by mouth every 6 (six) hours as needed for moderate pain or severe pain.    Historical Provider, MD  medroxyPROGESTERone (PROVERA) 10 MG tablet TAKE 1 TABLET BY MOUTH EVERY DAY FOR 10 DAYS AS NEEDED FOR HEAVY AND/OR PROLONGED BLEEDING 12/11/14   Historical Provider, MD  meloxicam (MOBIC) 15 MG tablet Take 1 tablet by mouth daily. 10/08/13   Historical Provider, MD  misoprostol (CYTOTEC) 200 MCG tablet  11/04/14   Historical Provider, MD  oxyCODONE-acetaminophen (PERCOCET/ROXICET) 5-325 MG per tablet Take 1-2 tablets by mouth every 6 (six) hours as needed for severe pain. 11/06/14  Thurnell Lose, MD  solifenacin (VESICARE) 5 MG tablet Take 5 mg by mouth daily.     Historical Provider, MD  Tapentadol HCl 50 MG TB12 Take 1 tablet by mouth daily as needed (pain).    Historical Provider, MD  tiZANidine (ZANAFLEX) 4 MG tablet Take 1 tablet by mouth at bedtime. 12/18/13   Historical Provider, MD  traMADol (ULTRAM) 50 MG tablet Take 1 tablet (50 mg total) by mouth every 6 (six) hours as needed. 12/09/15   Billy Fischer, MD  valsartan-hydrochlorothiazide (DIOVAN-HCT) 320-25 MG per tablet Take 1 tablet by mouth daily.  06/27/12   Historical Provider, MD   Meds Ordered and Administered this Visit   Medications - No data to display  BP 142/94 mmHg  Pulse 90  Temp(Src) 98.3 F (36.8 C) (Oral)  Resp 16  SpO2 98% No data found.   Physical Exam  Constitutional: She is oriented to person, place, and time. She appears well-developed and well-nourished. No distress.  Neck: Normal range of motion. Neck supple.  Musculoskeletal: She exhibits tenderness.  Neurological: She is alert and oriented to person, place, and time.  Skin: Skin is warm and dry.  Nursing note and vitals reviewed.   ED Course  Procedures (including critical care time)  Labs Review Labs Reviewed - No data to display  Imaging Review No results found.   Visual Acuity Review  Right Eye Distance:   Left Eye Distance:   Bilateral Distance:    Right Eye Near:   Left Eye Near:    Bilateral Near:         MDM   1. Chronic pain disorder        Billy Fischer, MD 12/09/15 [phone]

## 2015-12-09 NOTE — ED Notes (Signed)
Pt  Advised  To  followup with a  Pain  Control     Clinic

## 2015-12-09 NOTE — ED Notes (Signed)
Pt    Reports  Low  Back  Pain         In between  Pain  Management   Providers        She  Reports  Chronic  Pain         r hip        She  Reports has  Been trying  Heating  Pads           she  Ambulated  To  Room  With a slow    Steady  Gait    Voices  No  Complaints  Of any  Recent fall  Or  specefic    injury

## 2015-12-29 ENCOUNTER — Ambulatory Visit: Payer: Medicare Other | Admitting: Dietician

## 2016-02-06 ENCOUNTER — Telehealth: Payer: Self-pay | Admitting: Hematology and Oncology

## 2016-02-06 ENCOUNTER — Ambulatory Visit: Payer: Medicare Other | Admitting: Hematology and Oncology

## 2016-02-06 NOTE — Telephone Encounter (Signed)
Spoke with patient regarding apoointment rescheduled to  02/13/16 @ 11:15 am Verdis Frederickson F)

## 2016-02-06 NOTE — Assessment & Plan Note (Addendum)
Invasive ductal carcinoma of the right breast diagnosed 04/26/2008. S/P lumpectomy 05/14/08. low-grade 2 cm size. 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 Margins were negative after reexcision on 05/30/2008. Three sentinel lymph nodes were negative. ER/PR 99%. Ki67 10%. HER-2/neu was 2+, but negative by CISH. Oncotype DX score 14 (ROR 9%). Tumor stage was T1c N0. S/P Radiation12/28/2009 through 09/11/2008.Started tamoxifen 20 mg daily February 2010  Tamoxifen toxicities: 1. Severe hot flashes 2. Recent intermittent uterine bleeding: Her last regular cycle was when she was 60 years old. She is very concerned about risk of uterine cancer with tamoxifen.  Breast cancer index 02/13/2015: Low risk of late recurrence here 5-10 at 3.4%, low likelihood of benefit from extended endocrine therapy.  Breast Cancer Surveillance: 1. Breast exam 02/06/2016: Normal 2. Mammogram 11/19/2015: 8 mm group of suspicious calcifications in the medial central right breast, biopsy performed which revealed hyalinized fibroadenoma with calcifications, breast density category A  Return to clinic in 1 year for follow-up with survivorship clinic

## 2016-02-13 ENCOUNTER — Ambulatory Visit (HOSPITAL_BASED_OUTPATIENT_CLINIC_OR_DEPARTMENT_OTHER): Payer: Medicare Other | Admitting: Hematology and Oncology

## 2016-02-13 ENCOUNTER — Encounter: Payer: Self-pay | Admitting: Hematology and Oncology

## 2016-02-13 VITALS — BP 137/88 | HR 87 | Temp 98.3°F | Wt 324.4 lb

## 2016-02-13 DIAGNOSIS — C50411 Malignant neoplasm of upper-outer quadrant of right female breast: Secondary | ICD-10-CM

## 2016-02-13 DIAGNOSIS — Z853 Personal history of malignant neoplasm of breast: Secondary | ICD-10-CM

## 2016-02-13 NOTE — Assessment & Plan Note (Signed)
Invasive ductal carcinoma of the right breast diagnosed 04/26/2008. S/P lumpectomy 05/14/08. low-grade 2 cm size. 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 Margins were negative after reexcision on 05/30/2008. Three sentinel lymph nodes were negative. ER/PR 99%. Ki67 10%. HER-2/neu was 2+, but negative by CISH. Oncotype DX score 14 (ROR 9%). Tumor stage was T1c N0. S/P Radiation12/28/2009 through 09/11/2008.Started tamoxifen 20 mg daily February 2010  Tamoxifen toxicities: 1. Severe hot flashes 2. Recent intermittent uterine bleeding: Her last regular cycle was when she was 60 years old. She is very concerned about risk of uterine cancer with tamoxifen.  Breast cancer index: 3.4% risk of late recurrence here 5-10, low likelihood of benefit from extended adjuvant therapy 13% versus 9% not significant.   Breast Cancer Surveillance: 1. Breast exam 02/13/2016: Normal 2. Mammogram and biopsy: 11/24/2015: Right breast biopsy for 8mm calcifications: Hyalinized fibroadenoma with associated coarse calcifications, no atypia or malignancy identified  Return to survivorship clinic in 1 year for follow-up 

## 2016-02-13 NOTE — Progress Notes (Signed)
Vitals entered by this RN as reported by NT as encounter closed prior to NT charting.

## 2016-02-13 NOTE — Progress Notes (Signed)
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, lost to follow-up until 10/03/2012   05/14/2008 Surgery Right breast lumpectomy: IDC grade 1, 0/3 sentinel nodes, low-grade DCIS present at posterior margin, ER 99%, PR 99%, Ki-67 10%, HER-2 2+ equivocal,CISH negative   05/30/2008 Surgery Margin reexcision: Benign   07/22/2008 - 09/11/2008 Radiation Therapy Adjuvant radiation therapy   09/19/2008 - 12/18/2013 Anti-estrogen oral therapy Tamoxifen 20 mg daily   02/13/2015 Procedure breast cancer index: 3.4% risk of late recurrence here 5-10, low likelihood of benefit from extended adjuvant therapy 13% versus 9% not significant   11/24/2015 Procedure Right breast biopsy for 37m calcifications: Hyalinized fibroadenoma with associated coarse calcifications, no atypia or malignancy identified    CHIEF COMPLIANT: Biopsy on the right breast in May 2017  INTERVAL HISTORY: Kelli ATILANOis a 60year old with above-mentioned history of right breast cancer treated with lumpectomy radiation and 5 years of tamoxifen. Breast cancer index showed that she did not need extended adjuvant therapy. In May 2017 check a mammogram that showed 821mcalcifications this was biopsied and proven to be hyalinized fibroadenoma. There was no malignancy noted. Since the biopsy she has nonhealing wound which has been slow to heal.  REVIEW OF SYSTEMS:   Constitutional: Denies fevers, chills or abnormal weight loss Eyes: Denies blurriness of vision Ears, nose, mouth, throat, and face: Denies mucositis or sore throat Respiratory: Denies cough, dyspnea or wheezes Cardiovascular: Denies palpitation, chest discomfort Gastrointestinal:  Denies nausea, heartburn or change in bowel habits Skin: Denies abnormal skin  rashes Lymphatics: Denies new lymphadenopathy or easy bruising Neurological:Denies numbness, tingling or new weaknesses Behavioral/Psych: Mood is stable, no new changes  Extremities: No lower extremity edema Breast: Right breast wound related to prior biopsy All other systems were reviewed with the patient and are negative.  I have reviewed the past medical history, past surgical history, social history and family history with the patient and they are unchanged from previous note.  ALLERGIES:  has No Known Allergies.  MEDICATIONS:  Current Outpatient Prescriptions  Medication Sig Dispense Refill  . acetaminophen (TYLENOL) 325 MG tablet Take 650 mg by mouth every 6 (six) hours as needed for moderate pain.     . Cholecalciferol (VITAMIN D PO) Take 1,000 mg by mouth daily.      . DULoxetine (CYMBALTA) 60 MG capsule Take 1 capsule by mouth daily.    . ferrous sulfate 325 (65 FE) MG tablet Take 325 mg by mouth daily with breakfast.    . HYDROcodone-acetaminophen (NORCO) 10-325 MG per tablet Take 1-2 tablets by mouth every 6 (six) hours as needed for moderate pain or severe pain.    . medroxyPROGESTERone (PROVERA) 10 MG tablet TAKE 1 TABLET BY MOUTH EVERY DAY FOR 10 DAYS AS NEEDED FOR HEAVY AND/OR PROLONGED BLEEDING  0  . meloxicam (MOBIC) 15 MG tablet Take 1 tablet by mouth daily.    . misoprostol (CYTOTEC) 200 MCG tablet     . oxyCODONE-acetaminophen (PERCOCET/ROXICET) 5-325 MG per tablet Take 1-2 tablets by mouth every 6 (six) hours as needed for severe pain. 15 tablet 0  . solifenacin (VESICARE) 5 MG tablet Take 5 mg by mouth daily.     . Tapentadol HCl 50 MG TB12 Take 1 tablet by mouth daily as needed (pain).    . Marland KitcheniZANidine (  ZANAFLEX) 4 MG tablet Take 1 tablet by mouth at bedtime.    . traMADol (ULTRAM) 50 MG tablet Take 1 tablet (50 mg total) by mouth every 6 (six) hours as needed. 10 tablet 0  . valsartan-hydrochlorothiazide (DIOVAN-HCT) 320-25 MG per tablet Take 1 tablet by mouth  daily.      No current facility-administered medications for this visit.    PHYSICAL EXAMINATION: ECOG PERFORMANCE STATUS: 1 - Symptomatic but completely ambulatory  There were no vitals filed for this visit. There were no vitals filed for this visit.  GENERAL:alert, no distress and comfortable SKIN: skin color, texture, turgor are normal, no rashes or significant lesions EYES: normal, Conjunctiva are pink and non-injected, sclera clear OROPHARYNX:no exudate, no erythema and lips, buccal mucosa, and tongue normal  NECK: supple, thyroid normal size, non-tender, without nodularity LYMPH:  no palpable lymphadenopathy in the cervical, axillary or inguinal LUNGS: clear to auscultation and percussion with normal breathing effort HEART: regular rate & rhythm and no murmurs and no lower extremity edema ABDOMEN:abdomen soft, non-tender and normal bowel sounds MUSCULOSKELETAL:no cyanosis of digits and no clubbing  NEURO: alert & oriented x 3 with fluent speech, no focal motor/sensory deficits EXTREMITIES: No lower extremity edema BREAST: No palpable masses or nodules in either right or left breasts. The wound in the right breast is almost healed. There is no discharge. Small lumps are palpable in the right breast 11:00 position and 9:00 position which were previously palpated as well. (exam performed in the presence of a chaperone)  LABORATORY DATA:  I have reviewed the data as listed   Chemistry      Component Value Date/Time   NA 141 02/07/2015 0949   NA 140 11/05/2014 1600   K 3.4* 02/07/2015 0949   K 4.4 11/05/2014 1600   CL 102 11/05/2014 1600   CL 104 10/03/2012 1046   CO2 29 02/07/2015 0949   CO2 31 11/05/2014 1600   BUN 10.1 02/07/2015 0949   BUN 13 11/05/2014 1600   CREATININE 0.8 02/07/2015 0949   CREATININE 0.79 11/05/2014 1600      Component Value Date/Time   CALCIUM 10.1 02/07/2015 0949   CALCIUM 10.0 11/05/2014 1600   ALKPHOS 96 02/07/2015 0949   ALKPHOS 74  01/03/2012 1138   AST 16 02/07/2015 0949   AST 11 01/03/2012 1138   ALT 13 02/07/2015 0949   ALT 8 01/03/2012 1138   BILITOT 0.45 02/07/2015 0949   BILITOT 0.3 01/03/2012 1138       Lab Results  Component Value Date   WBC 12.8* 02/07/2015   HGB 12.4 02/07/2015   HCT 39.1 02/07/2015   MCV 82.5 02/07/2015   PLT 318 02/07/2015   NEUTROABS 8.7* 02/07/2015     ASSESSMENT & PLAN:  Breast cancer of upper-outer quadrant of right female breast ([place]) Invasive ductal carcinoma of the right breast diagnosed 04/26/2008. S/P lumpectomy 05/14/08. low-grade 2 cm size. 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 Margins were negative after reexcision on 05/30/2008. Three sentinel lymph nodes were negative. ER/PR 99%. Ki67 10%. HER-2/neu was 2+, but negative by CISH. Oncotype DX score 14 (ROR 9%). Tumor stage was T1c N0. S/P Radiation12/28/2009 through 09/11/2008.Started tamoxifen 20 mg daily February 2010 completed 2015  Breast cancer index: 3.4% risk of late recurrence here 5-10, low likelihood of benefit from extended adjuvant therapy 13% versus 9% not significant.   Breast Cancer Surveillance: 1. Breast exam 02/13/2016: Normal 2. Mammogram and biopsy: 11/24/2015: Right breast biopsy for 30m calcifications: Hyalinized fibroadenoma  with associated coarse calcifications, no atypia or malignancy identified She will need another mammogram in October bilaterally. I encouraged her to continue on serial mammograms.  Return to clinic in 1 year for follow-up    No orders of the defined types were placed in this encounter.   The patient has a good understanding of the overall plan. she agrees with it. she will call with any problems that may develop before the next visit here.   Rulon Eisenmenger, MD 02/13/2016

## 2016-02-13 NOTE — Assessment & Plan Note (Deleted)
Invasive ductal carcinoma of the right breast diagnosed 04/26/2008. S/P lumpectomy 05/14/08. low-grade 2 cm size. 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 Margins were negative after reexcision on 05/30/2008. Three sentinel lymph nodes were negative. ER/PR 99%. Ki67 10%. HER-2/neu was 2+, but negative by CISH. Oncotype DX score 14 (ROR 9%). Tumor stage was T1c N0. S/P Radiation12/28/2009 through 09/11/2008.Started tamoxifen 20 mg daily February 2010  Tamoxifen toxicities: 1. Severe hot flashes 2. Recent intermittent uterine bleeding: Her last regular cycle was when she was 60 years old. She is very concerned about risk of uterine cancer with tamoxifen.  Breast cancer index: 3.4% risk of late recurrence here 5-10, low likelihood of benefit from extended adjuvant therapy 13% versus 9% not significant.   Breast Cancer Surveillance: 1. Breast exam 02/13/2016: Normal 2. Mammogram and biopsy: 11/24/2015: Right breast biopsy for 43m calcifications: Hyalinized fibroadenoma with associated coarse calcifications, no atypia or malignancy identified  Return to survivorship clinic in 1 year for follow-up

## 2016-02-24 ENCOUNTER — Other Ambulatory Visit: Payer: Self-pay | Admitting: General Surgery

## 2016-04-26 IMAGING — MR MR LUMBAR SPINE W/O CM
4 of 5 series · 25 of 48 positions shown · non-contrast
Comparison: Multiple exams, including 06/05/2015

CLINICAL DATA: Chronic right low back pain radiating into the right
leg. History of breast cancer.

EXAM:
MRI LUMBAR SPINE WITHOUT CONTRAST
TECHNIQUE: Multiplanar, multisequence MR imaging of the lumbar spine was
performed. No intravenous contrast was administered.

[Series 4: T2 · sagittal · 4.0mm · 0.55mm/px · 7 of 14 slices shown (1 of 2)]
[im 1/14]
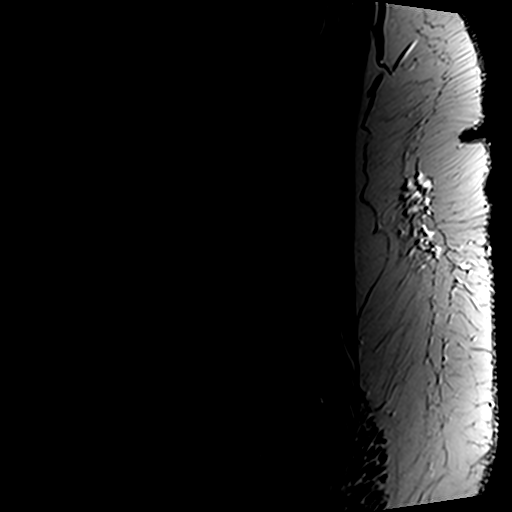
[im 3/14]
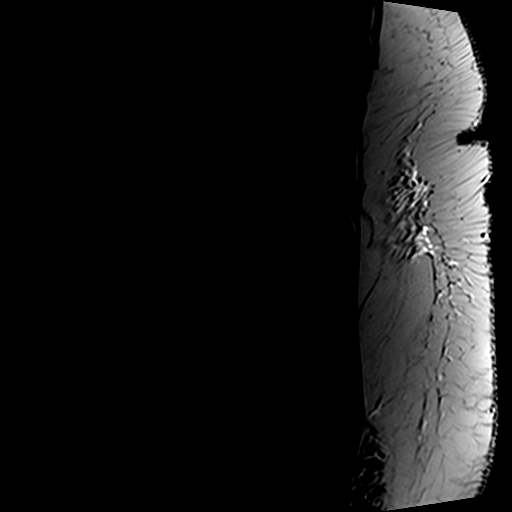
[im 5/14]
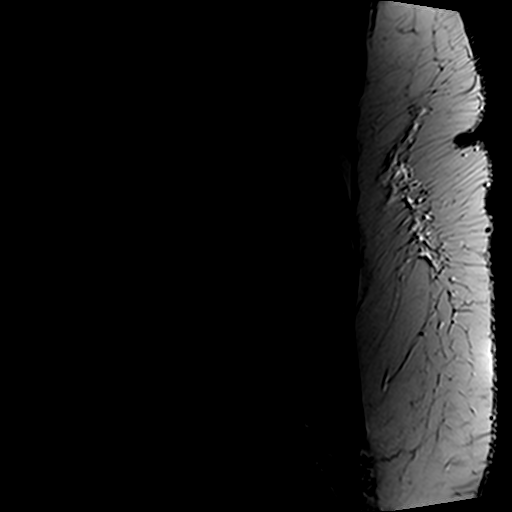
[im 7/14]
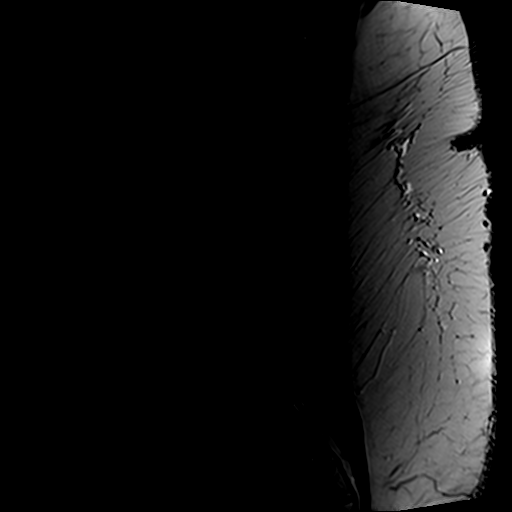
[im 9/14]
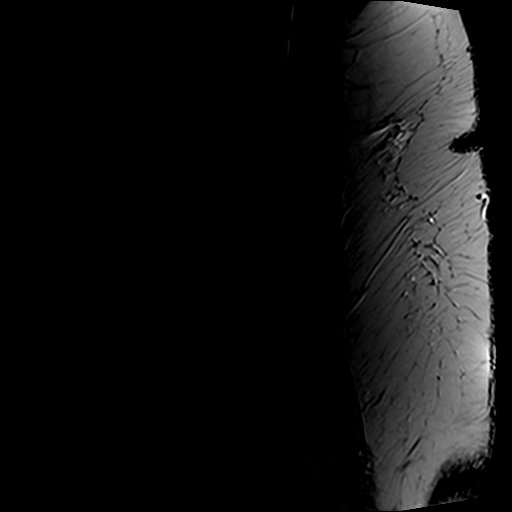
[im 11/14]
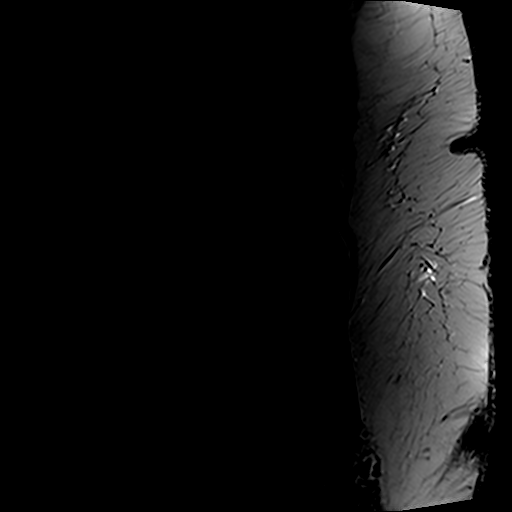
[im 14/14]
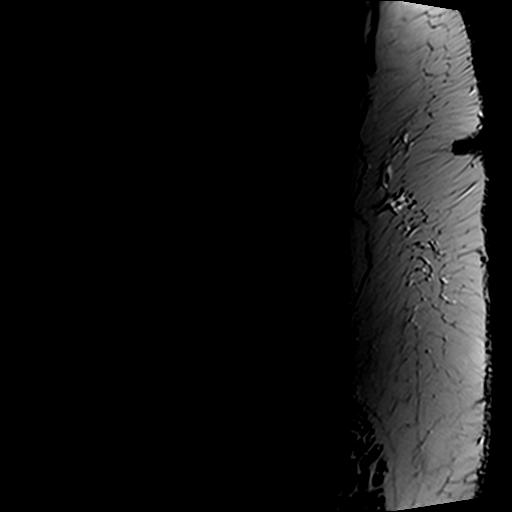

[Series 5: T1 · sagittal · 4.0mm · 0.55mm/px · 7 of 14 slices shown (1 of 2)]
[im 1/14]
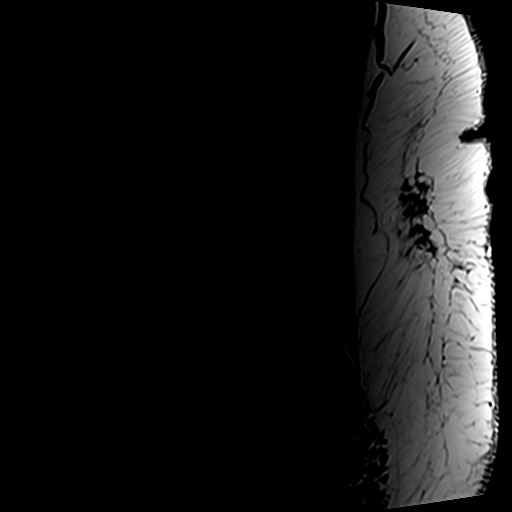
[im 3/14]
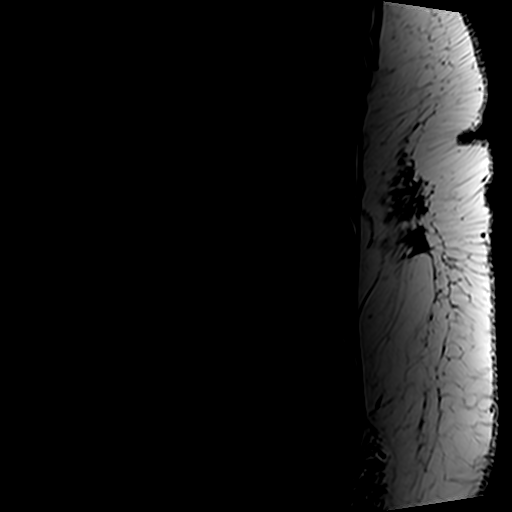
[im 5/14]
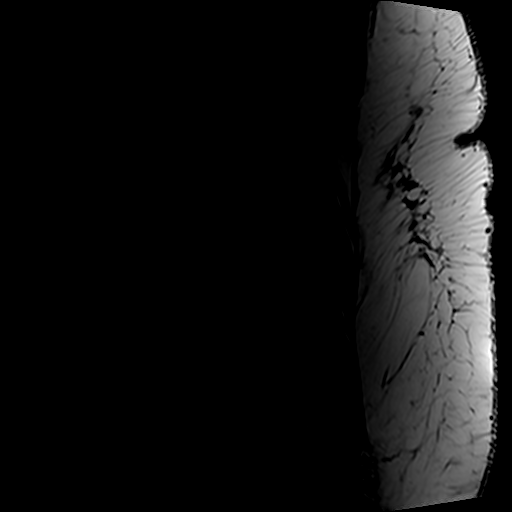
[im 7/14]
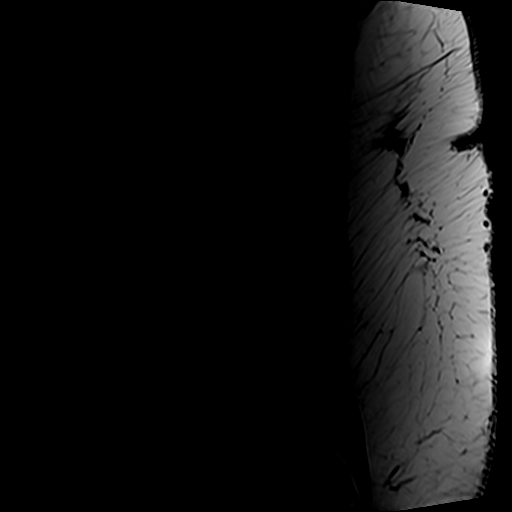
[im 9/14]
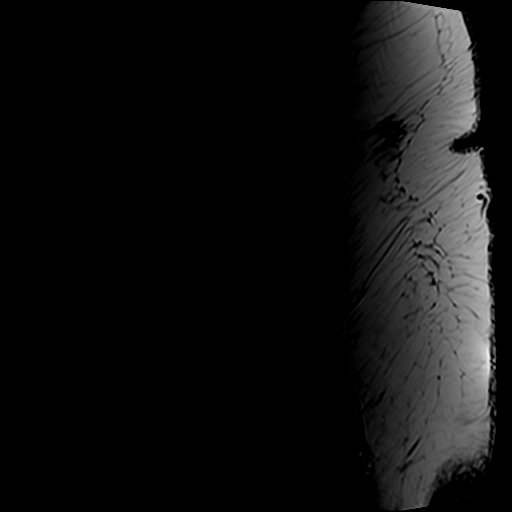
[im 11/14]
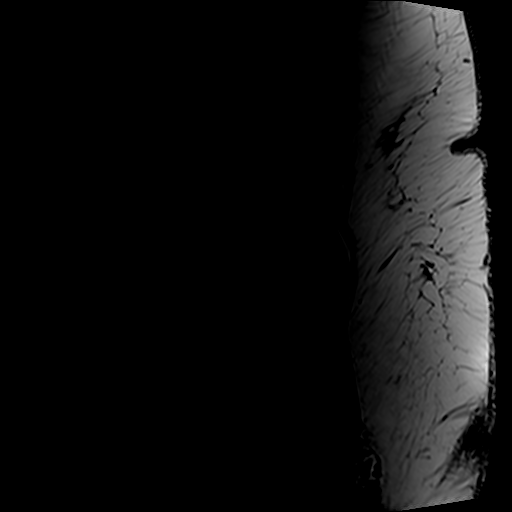
[im 14/14]
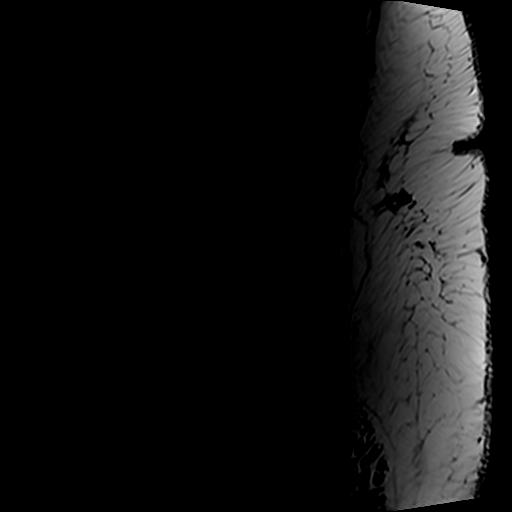

[Series 7: T2 · axial · 4.0mm · 0.70mm/px · z∈[-118,+50]mm · 8 of 32 slices shown (2 of 2)]
[im 1/32]
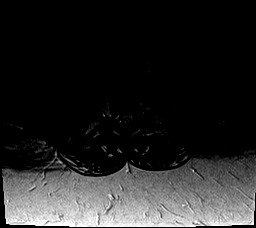
[im 5/32]
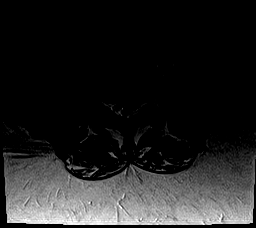
[im 10/32]
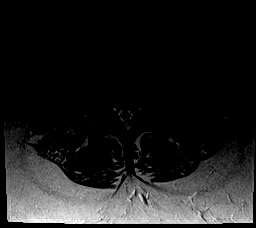
[im 15/32]
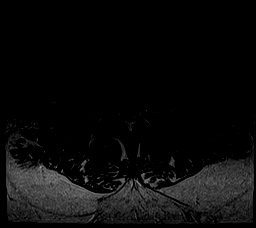
[im 17/32]
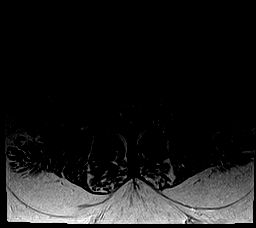
[im 22/32]
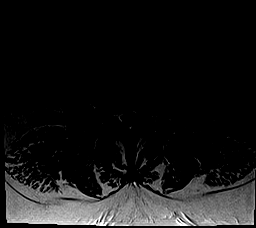
[im 27/32]
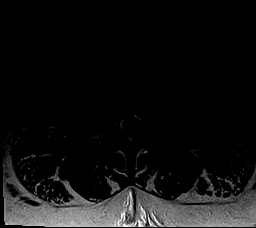
[im 32/32]
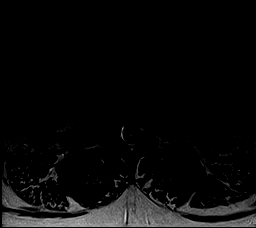

[Series 8: T1 · axial · 4.0mm · 0.35mm/px · z∈[-98,+25]mm · 3 of 32 slices shown (2 of 2)]
[im 5/32]
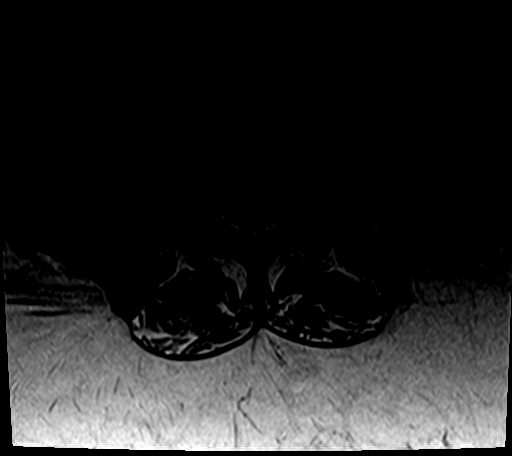
[im 17/32]
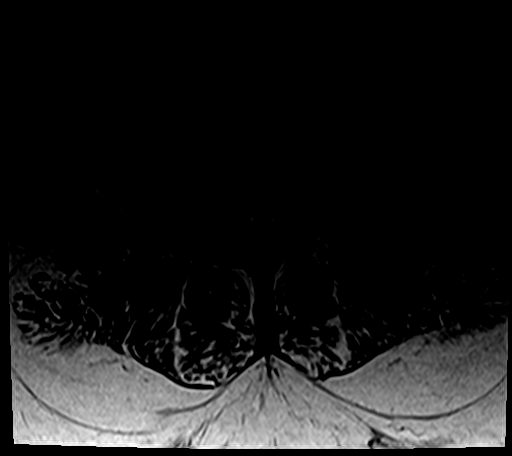
[im 27/32]
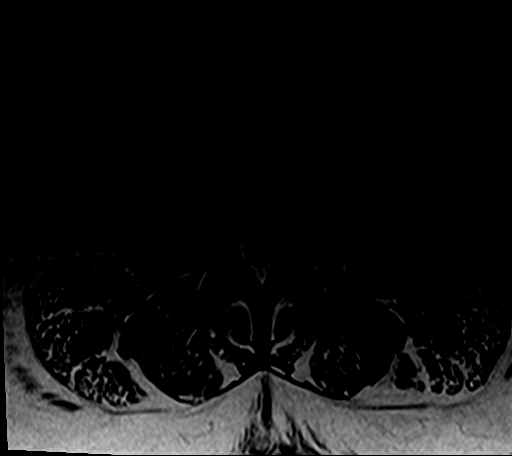

[25 of 48 positions shown; findings below may reference images not displayed]

FINDINGS: The lowest lumbar type non-rib-bearing vertebra is labeled as L5.
The T12 ribs are thought to be hypoplastic.

The conus medullaris appears normal.  Conus level:  T12.

There is 3 mm degenerative anterolisthesis at L3-4 without pars
defects identified. Type 2 degenerative endplate findings at L3-4.
Disc desiccation at L2-3, L3-4, and L4-5 with loss of disc height at
L3-4.

No significant vertebral marrow edema is identified. No compelling
findings of metastatic disease to the lumbar spine. Mild prominence
of lower lumbar epidural adipose tissue and mildly congenitally
short pedicles in the lumbar spine. Additional findings at
individual levels are as follows:

L1-2: Unremarkable.

L2- 3: No impingement. Mild disc bulge and mild intervertebral
spurring along with mild right facet arthropathy.

L3-4: Moderate central narrowing of the thecal sac and mild right
foraminal stenosis due to disc bulge, intervertebral spurring, facet
arthropathy, and small central disc protrusion.

L4-5: Moderate left and mild right foraminal stenosis and borderline
central narrowing of the thecal sac due to intervertebral spurring,
disc bulge, and facet arthropathy.

L5-S1:  Unremarkable.
IMPRESSION: 1. Lumbar spondylosis and degenerative disc disease, causing
moderate impingement at L3-4 and L4-5, as detailed above.
2. T12 ribs hypoplastic. The lowest lumbar type non-rib-bearing
vertebra is labeled as L5.

## 2016-05-10 ENCOUNTER — Other Ambulatory Visit: Payer: Self-pay | Admitting: Internal Medicine

## 2016-05-10 DIAGNOSIS — Z9889 Other specified postprocedural states: Secondary | ICD-10-CM

## 2016-05-10 DIAGNOSIS — R921 Mammographic calcification found on diagnostic imaging of breast: Secondary | ICD-10-CM

## 2016-06-03 ENCOUNTER — Other Ambulatory Visit (HOSPITAL_COMMUNITY)
Admission: RE | Admit: 2016-06-03 | Discharge: 2016-06-03 | Disposition: A | Discharge status: Home / Self Care | Payer: Medicare Other | Source: Ambulatory Visit | Attending: Obstetrics and Gynecology | Admitting: Obstetrics and Gynecology

## 2016-06-03 ENCOUNTER — Other Ambulatory Visit: Payer: Self-pay | Admitting: Obstetrics and Gynecology

## 2016-06-03 DIAGNOSIS — Z01419 Encounter for gynecological examination (general) (routine) without abnormal findings: Secondary | ICD-10-CM | POA: Diagnosis present

## 2016-06-03 DIAGNOSIS — Z1151 Encounter for screening for human papillomavirus (HPV): Secondary | ICD-10-CM | POA: Insufficient documentation

## 2016-06-04 LAB — CYTOLOGY - PAP
DIAGNOSIS: NEGATIVE
HPV: NOT DETECTED

## 2016-06-10 ENCOUNTER — Ambulatory Visit
Admission: RE | Admit: 2016-06-10 | Discharge: 2016-06-10 | Disposition: A | Discharge status: Home / Self Care | Payer: Medicare Other | Source: Ambulatory Visit | Attending: Internal Medicine | Admitting: Internal Medicine

## 2016-06-10 DIAGNOSIS — R921 Mammographic calcification found on diagnostic imaging of breast: Secondary | ICD-10-CM

## 2016-06-10 DIAGNOSIS — Z9889 Other specified postprocedural states: Secondary | ICD-10-CM

## 2017-05-02 ENCOUNTER — Other Ambulatory Visit: Payer: Self-pay | Admitting: Internal Medicine

## 2017-05-02 DIAGNOSIS — Z1231 Encounter for screening mammogram for malignant neoplasm of breast: Secondary | ICD-10-CM

## 2017-05-31 ENCOUNTER — Other Ambulatory Visit: Payer: Self-pay | Admitting: Internal Medicine

## 2017-05-31 DIAGNOSIS — Z1231 Encounter for screening mammogram for malignant neoplasm of breast: Secondary | ICD-10-CM

## 2017-06-14 ENCOUNTER — Ambulatory Visit: Payer: Medicare Other

## 2017-06-23 ENCOUNTER — Ambulatory Visit
Admission: RE | Admit: 2017-06-23 | Discharge: 2017-06-23 | Disposition: A | Discharge status: Home / Self Care | Payer: Medicare Other | Source: Ambulatory Visit | Attending: Internal Medicine | Admitting: Internal Medicine

## 2017-06-23 DIAGNOSIS — Z1231 Encounter for screening mammogram for malignant neoplasm of breast: Secondary | ICD-10-CM

## 2017-06-23 HISTORY — DX: Personal history of irradiation: Z92.3

## 2017-11-10 ENCOUNTER — Other Ambulatory Visit: Payer: Self-pay | Admitting: General Surgery

## 2018-01-04 NOTE — Pre-Procedure Instructions (Addendum)
Kelli Abbott  01/04/2018      [facility] [place] [address], [place] [address] Phone: [phone] Fax: [phone]    Your procedure is scheduled on Tues., January 17, 2018 from 7:30AM-9:00AM  Report to [facility] Admitting Entrance "A" at 5:30AM  Call this number if you have problems the morning of surgery:  [phone]   Remember:  Do not eat or drink after midnight on June 24th  Please complete your PRE-SURGERY ENSURE that was given to by 4:30AM the morning of surgery.  Please, if able, drink it in one setting. DO NOT SIP.   Take these medicines the morning of surgery with A SIP OF WATER By 4:30AM: DULoxetine (CYMBALTA) and Solifenacin (VESICARE). If needed  HYDROcodone-acetaminophen (NORCO)  7 days before surgery (6/17), stop taking all Other Aspirin Products, Vitamins, Fish oils, and Herbal medications. Also stop all NSAIDS i.e. Advil, Ibuprofen, Motrin, Aleve, Anaprox, Naproxen, BC, Goody Powders, and all Supplements. Including: Meloxicam (MOBIC)   Do not wear jewelry, make-up or nail polish (fingers).  Do not wear lotions, powders, or perfumes, or deodorant.  Do not shave 48 hours prior to surgery.   Do not bring valuables to the hospital.  [facility] is not responsible for any belongings or valuables.  Contacts, dentures or bridgework may not be worn into surgery.  Leave your suitcase in the car.  After surgery it may be brought to your room.  For patients admitted to the hospital, discharge time will be determined by your treatment team.  Patients discharged the day of surgery will not be allowed to drive home.   Special instructions:   [facility]- Preparing For Surgery  Before surgery, you can play an important role. Because skin is not sterile, your skin needs to be as free of germs as possible. You can reduce the number of germs on your skin by washing with CHG  (chlorahexidine gluconate) Soap before surgery.  CHG is an antiseptic cleaner which kills germs and bonds with the skin to continue killing germs even after washing.    Oral Hygiene is also important to reduce your risk of infection.  Remember - BRUSH YOUR TEETH THE MORNING OF SURGERY WITH YOUR REGULAR TOOTHPASTE  Please do not use if you have an allergy to CHG or antibacterial soaps. If your skin becomes reddened/irritated stop using the CHG.  Do not shave (including legs and underarms) for at least 48 hours prior to first CHG shower. It is OK to shave your face.  Please follow these instructions carefully.   1. Shower the NIGHT BEFORE SURGERY and the MORNING OF SURGERY with CHG.   2. If you chose to wash your hair, wash your hair first as usual with your normal shampoo.  3. After you shampoo, rinse your hair and body thoroughly to remove the shampoo.  4. Use CHG as you would any other liquid soap. You can apply CHG directly to the skin and wash gently with a scrungie or a clean washcloth.   5. Apply the CHG Soap to your body ONLY FROM THE NECK DOWN.  Do not use on open wounds or open sores. Avoid contact with your eyes, ears, mouth and genitals (private parts). Wash Face and genitals (private parts)  with your normal soap.  6. Wash thoroughly, paying special attention to the area where your surgery will be performed.  7. Thoroughly rinse your body with  warm water from the neck down.  8. DO NOT shower/wash with your normal soap after using and rinsing off the CHG Soap.  9. Pat yourself dry with a CLEAN TOWEL.  10. Wear CLEAN PAJAMAS to bed the night before surgery, wear comfortable clothes the morning of surgery  11. Place CLEAN SHEETS on your bed the night of your first shower and DO NOT SLEEP WITH PETS.  Day of Surgery:  Do not apply any deodorants/lotions.  Please wear clean clothes to the hospital/surgery center.   Remember to brush your teeth WITH YOUR REGULAR  TOOTHPASTE.  Please read over the following fact sheets that you were given. Pain Booklet, Coughing and Deep Breathing and Surgical Site Infection Prevention

## 2018-01-05 ENCOUNTER — Encounter (HOSPITAL_COMMUNITY)
Admission: RE | Admit: 2018-01-05 | Discharge: 2018-01-05 | Disposition: A | Discharge status: Home / Self Care | Payer: Medicare Other | Source: Ambulatory Visit | Attending: General Surgery | Admitting: General Surgery

## 2018-01-05 ENCOUNTER — Other Ambulatory Visit: Payer: Self-pay

## 2018-01-05 ENCOUNTER — Encounter (HOSPITAL_COMMUNITY): Payer: Self-pay

## 2018-01-05 DIAGNOSIS — R9431 Abnormal electrocardiogram [ECG] [EKG]: Secondary | ICD-10-CM | POA: Insufficient documentation

## 2018-01-05 DIAGNOSIS — Z01810 Encounter for preprocedural cardiovascular examination: Secondary | ICD-10-CM | POA: Diagnosis not present

## 2018-01-05 DIAGNOSIS — Z01812 Encounter for preprocedural laboratory examination: Secondary | ICD-10-CM | POA: Diagnosis present

## 2018-01-05 HISTORY — DX: Other specified postprocedural states: Z98.890

## 2018-01-05 HISTORY — DX: Anemia, unspecified: D64.9

## 2018-01-05 HISTORY — DX: Localized swelling, mass and lump, trunk: R22.2

## 2018-01-05 HISTORY — DX: Nausea with vomiting, unspecified: R11.2

## 2018-01-05 LAB — BASIC METABOLIC PANEL
ANION GAP: 7 (ref 5–15)
BUN: 8 mg/dL (ref 6–20)
CO2: 28 mmol/L (ref 22–32)
Calcium: 10.2 mg/dL (ref 8.9–10.3)
Chloride: 105 mmol/L (ref 101–111)
Creatinine, Ser: 0.84 mg/dL (ref 0.44–1.00)
GFR calc Af Amer: >60 mL/min (ref >60)
GFR calc non Af Amer: >60 mL/min (ref >60)
GLUCOSE: 94 mg/dL (ref 65–99)
POTASSIUM: 4 mmol/L (ref 3.5–5.1)
Sodium: 140 mmol/L (ref 135–145)

## 2018-01-05 LAB — CBC
HEMATOCRIT: 43.4 % (ref 36.0–46.0)
Hemoglobin: 13.5 g/dL (ref 12.0–15.0)
MCH: 26.9 pg (ref 26.0–34.0)
MCHC: 31.1 g/dL (ref 30.0–36.0)
MCV: 86.6 fL (ref 78.0–100.0)
Platelets: 332 10*3/uL (ref 150–400)
RBC: 5.01 MIL/uL (ref 3.87–5.11)
RDW: 15.9 % — ABNORMAL HIGH (ref 11.5–15.5)
WBC: 11.1 10*3/uL — ABNORMAL HIGH (ref 4.0–10.5)

## 2018-01-05 NOTE — Progress Notes (Signed)
[facility]  Cardiologist - Denies  Chest x-ray - Denies  EKG - 01/05/18  Stress Test - Denies  ECHO - Denies  Cardiac Cath - Denies  Sleep Study - Denies CPAP - None  LABS- 01/05/18: CBC, BMP  ASA- Denies   Anesthesia- Yes-EKG  Pt denies having chest pain, sob, or fever at this time. All instructions explained to the pt, with a verbal understanding of the material. Pt agrees to go over the instructions while at home for a better understanding. The opportunity to ask questions was provided.

## 2018-01-06 NOTE — Progress Notes (Signed)
Anesthesia Chart Review:   Case:  [id number] Date/Time:  01/17/18 0715   Procedure:  EXCISION  OF UPPER BACK MASS (N/A )   Anesthesia type:  General   Pre-op diagnosis:  Back Mass   Location:  [facility] OR ROOM 02 / [facility]   Surgeon:  Stark Klein, MD      DISCUSSION: - Pt is a 62 year old female with HTN  VS: BP (!) 154/96 Comment: will notify Minna Merritts, RN  Pulse 94   Temp 36.8 C (Oral)   Resp 20   Ht 5\' 3"  (1.6 m)   Wt 292 lb 5.3 oz (132.6 kg)   SpO2 100%   BMI 51.78 kg/m    PROVIDERS: PCP is Wenda Low, MD   LABS: Labs reviewed: Acceptable for surgery. (all labs ordered are listed, but only abnormal results are displayed)  Labs Reviewed  CBC - Abnormal; Notable for the following components:      Result Value   WBC 11.1 (*)    RDW 15.9 (*)    All other components within normal limits  BASIC METABOLIC PANEL    EKG 2/64/15: NSR. Possible Septal infarct, age undetermined - the poor R wave progression may be due to lead placement   Past Medical History:  Diagnosis Date  . Anemia   . Arthritis    Osteoarthritis  . Breast cancer ([place])   . Breast lump   . Breast nodule    hypoechoic in right breast  . Bursitis of shoulder region   . Cancer ([facility])    breast  . Hypertension   . Mass on back   . Personal history of radiation therapy 2009/2010  . PONV (postoperative nausea and vomiting)   . Skin lesions, generalized     Past Surgical History:  Procedure Laterality Date  . BREAST BIOPSY Right 04/29/2008   malignant  . BREAST BIOPSY Right 11/24/2015   benign  . BREAST LUMPECTOMY Right 06/04/2008  . BREAST SURGERY  2009   R breast reexcision of margins  . HYSTEROSCOPY W/D&C N/A 11/06/2014   Procedure: DILATATION AND CURETTAGE /HYSTEROSCOPY with resection and myomectomy;  Surgeon: Thurnell Lose, MD;  Location: [facility];  Service: Gynecology;  Laterality: N/A;  . TOTAL HIP ARTHROPLASTY     right    MEDICATIONS: . Carboxymethylcellul-Glycerin (LUBRICATING EYE  DROPS OP)  . cholecalciferol (VITAMIN D) 1000 units tablet  . DULoxetine (CYMBALTA) 60 MG capsule  . ferrous sulfate 325 (65 FE) MG tablet  . HYDROcodone-acetaminophen (NORCO) 10-325 MG per tablet  . irbesartan-hydrochlorothiazide (AVALIDE) 300-12.5 MG tablet  . lidocaine (LMX) 4 % cream  . meloxicam (MOBIC) 15 MG tablet  . phentermine (ADIPEX-P) 37.5 MG tablet  . solifenacin (VESICARE) 10 MG tablet   No current facility-administered medications for this encounter.     If no changes, I anticipate pt can proceed with surgery as scheduled.   Willeen Cass, FNP-BC [facility] Short Stay Surgical Center/Anesthesiology Phone: [phone] 01/06/2018 2:52 PM

## 2018-01-16 MED ORDER — DEXTROSE 5 % IV SOLN
3.0000 g | INTRAVENOUS | Status: AC
  Administered 2018-01-17: 3 g via INTRAVENOUS
  Filled 2018-01-16: qty 3

## 2018-01-16 NOTE — Anesthesia Preprocedure Evaluation (Addendum)
Anesthesia Evaluation  Patient identified by MRN, date of birth, ID band Patient awake    Reviewed: Allergy & Precautions, NPO status , Patient's Chart, lab work & pertinent test results, reviewed documented beta blocker date and time   History of Anesthesia Complications (+) PONV and history of anesthetic complications  Airway Mallampati: II   Neck ROM: Full    Dental  (+) Teeth Intact, Dental Advisory Given   Pulmonary Sleep apnea: by definition. , former smoker,    breath sounds clear to auscultation       Cardiovascular hypertension, Pt. on medications  Rhythm:Regular     Neuro/Psych    GI/Hepatic   Endo/Other  Morbid obesity (BMI 56)  Renal/GU      Musculoskeletal  (+) Arthritis , Osteoarthritis,  SP hip replacement   Abdominal (+) + obese,   Peds  Hematology   Anesthesia Other Findings SP HIP replacement, care in positioning to guard against dislocation of hip  11/06/14; 1030 (created via procedure documentation); Difficulty was anticipated, Difficult airway - due to large tongue; Grade 2; MAC, 3 (DL and intubation by Dr. Tresa Moore); 7 mm; Cuffed,  Reproductive/Obstetrics                           Anesthesia Physical  Anesthesia Plan  ASA: III  Anesthesia Plan: General   Post-op Pain Management:    Induction: Intravenous  PONV Risk Score and Plan: 3 and Ondansetron, Dexamethasone, Treatment may vary due to age or medical condition and Scopolamine patch - Pre-op  Airway Management Planned: Oral ETT, LMA and Video Laryngoscope Planned  Additional Equipment:   Intra-op Plan:   Post-operative Plan: Extubation in OR  Informed Consent: I have reviewed the patients History and Physical, chart, labs and discussed the procedure including the risks, benefits and alternatives for the proposed anesthesia with the patient or authorized representative who has indicated his/her understanding  and acceptance.     Plan Discussed with: CRNA, Anesthesiologist and Surgeon  Anesthesia Plan Comments: (GLIDE available, multimodal pain RX, minimize narcotics  11/06/14; 1030 (created via procedure documentation); Difficulty was anticipated, Difficult airway - due to large tongue; Grade 2; MAC, 3 (DL and intubation by Dr. Tresa Moore); 7 mm; Cuffed,)       Anesthesia Quick Evaluation

## 2018-01-16 NOTE — H&P (Signed)
Kelli Abbott Location: [facility] Patient #: [id number] DOB: 1955/08/24 Single / Language: Cleophus Molt / Race: Black or African American Female   History of Present Illness  The patient is a 62 year old female who presents for a follow-up for Skin neoplasm. Patient 62 year old female that I took care of for right breast cancer in 2009. I followed her for 5 years. She had radiation and tamoxifen. She had to get a biopsy for some calcifications that were negative for cancer.   I saw her 2 years ago regarding an upper back/lower neck mass. we had planned to excise it, but her daughter was not available. She returns for discussion. She thinks it ahs continued to get larger.   Also, her left breast remains much larger than her right.   Allergies No Known Drug Allergies  Allergies Reconciled   Medication History No Current Medications Medications Reconciled    Review of Systems All other systems negative  Vitals  Weight: 314 lb Height: 62in Body Surface Area: 2.32 m Body Mass Index: 57.43 kg/m  Temp.: 97.98F(Oral)  Pulse: 50 (Regular)  BP: 124/92 (Sitting, Left Arm, Standard)       Physical Exam General Mental Status-Alert. General Appearance-Consistent with stated age. Hydration-Well hydrated. Voice-Normal.  Integumentary Note: large 11 x 11 cm upper back mass that is mobile. larger than last visit.   Head and Neck Head-normocephalic, atraumatic with no lesions or palpable masses.  Eye Sclera/Conjunctiva - Bilateral-No scleral icterus.  Chest and Lung Exam Chest and lung exam reveals -quiet, even and easy respiratory effort with no use of accessory muscles. Inspection Chest Wall - Normal. Back - normal.  Breast Note: left breast much larger than right.   Cardiovascular Cardiovascular examination reveals -normal pedal pulses bilaterally. Note: regular rate and rhythm  Abdomen Inspection-Inspection  Normal. Palpation/Percussion Palpation and Percussion of the abdomen reveal - Soft, Non Tender, No Rebound tenderness, No Rigidity (guarding) and No hepatosplenomegaly.  Peripheral Vascular Upper Extremity Inspection - Bilateral - Normal - No Clubbing, No Cyanosis, No Edema, Pulses Intact. Lower Extremity Palpation - Edema - Bilateral - No edema.  Neurologic Neurologic evaluation reveals -alert and oriented x 3 with no impairment of recent or remote memory. Mental Status-Normal.  Musculoskeletal Global Assessment -Note: no gross deformities.  Normal Exam - Left-Upper Extremity Strength Normal and Lower Extremity Strength Normal. Normal Exam - Right-Upper Extremity Strength Normal and Lower Extremity Strength Normal.  Lymphatic Head & Neck  General Head & Neck Lymphatics: Bilateral - Description - Normal. Axillary  General Axillary Region: Bilateral - Description - Normal. Tenderness - Non Tender.    Assessment & Plan  SUBCUTANEOUS MASS OF BACK (R22.2) Impression: Will plan excision. Discussed risks of bleeding, infection. Advised that I would leave a drain for several weeks.  Advised that she will need someone with her overnight and that she will be out of water aerobics at least 2 weeks. Current Plans You are being scheduled for surgery- Our schedulers will call you.  You should hear from our office's scheduling department within 5 working days about the location, date, and time of surgery. We try to make accommodations for patient's preferences in scheduling surgery, but sometimes the OR schedule or the surgeon's schedule prevents [place] from making those accommodations.  If you have not heard from our office [phone]) in 5 working days, call the office and ask for your surgeon's nurse.  If you have other questions about your diagnosis, plan, or surgery, call the office and ask for  your surgeon's nurse.  POSTOPERATIVE BREAST ASYMMETRY  (N64.89) Impression: History of right breast cancer, now with acquired size discrepency.  refer to consider left breast reduction. Current Plans Referred to Surgery - Plastic, for evaluation and follow up (Plastic Surgery). Routine.   Signed by Stark Klein, MD

## 2018-01-17 ENCOUNTER — Ambulatory Visit (HOSPITAL_COMMUNITY): Payer: Medicare Other | Admitting: Emergency Medicine

## 2018-01-17 ENCOUNTER — Ambulatory Visit (HOSPITAL_COMMUNITY)
Admission: RE | Admit: 2018-01-17 | Discharge: 2018-01-17 | Disposition: A | Discharge status: Home / Self Care | Payer: Medicare Other | Source: Ambulatory Visit | Attending: General Surgery | Admitting: General Surgery

## 2018-01-17 ENCOUNTER — Ambulatory Visit (HOSPITAL_COMMUNITY): Payer: Medicare Other | Admitting: Anesthesiology

## 2018-01-17 ENCOUNTER — Encounter (HOSPITAL_COMMUNITY)
Admission: RE | Disposition: A | Discharge status: Home / Self Care | Payer: Self-pay | Source: Ambulatory Visit | Attending: General Surgery

## 2018-01-17 DIAGNOSIS — G4730 Sleep apnea, unspecified: Secondary | ICD-10-CM | POA: Insufficient documentation

## 2018-01-17 DIAGNOSIS — L918 Other hypertrophic disorders of the skin: Secondary | ICD-10-CM | POA: Insufficient documentation

## 2018-01-17 DIAGNOSIS — Z79899 Other long term (current) drug therapy: Secondary | ICD-10-CM | POA: Diagnosis not present

## 2018-01-17 DIAGNOSIS — I10 Essential (primary) hypertension: Secondary | ICD-10-CM | POA: Diagnosis not present

## 2018-01-17 DIAGNOSIS — R222 Localized swelling, mass and lump, trunk: Secondary | ICD-10-CM

## 2018-01-17 DIAGNOSIS — D171 Benign lipomatous neoplasm of skin and subcutaneous tissue of trunk: Secondary | ICD-10-CM | POA: Diagnosis present

## 2018-01-17 DIAGNOSIS — Z87891 Personal history of nicotine dependence: Secondary | ICD-10-CM | POA: Diagnosis not present

## 2018-01-17 DIAGNOSIS — Z6841 Body Mass Index (BMI) 40.0 and over, adult: Secondary | ICD-10-CM | POA: Insufficient documentation

## 2018-01-17 HISTORY — PX: MASS EXCISION: SHX2000

## 2018-01-17 SURGERY — EXCISION MASS
Anesthesia: General | Site: Back

## 2018-01-17 MED ORDER — SUCCINYLCHOLINE CHLORIDE 20 MG/ML IJ SOLN
INTRAMUSCULAR | Status: AC
  Filled 2018-01-17: qty 1

## 2018-01-17 MED ORDER — OXYCODONE HCL 5 MG/5ML PO SOLN: 5.0000 mg | Freq: Once | ORAL | Status: DC | PRN

## 2018-01-17 MED ORDER — ACETAMINOPHEN 325 MG PO TABS: 325.0000 mg | ORAL_TABLET | ORAL | Status: DC | PRN

## 2018-01-17 MED ORDER — LIDOCAINE HCL (CARDIAC) PF 100 MG/5ML IV SOSY
PREFILLED_SYRINGE | INTRAVENOUS | Status: DC | PRN
  Administered 2018-01-17: 100 mg via INTRAVENOUS

## 2018-01-17 MED ORDER — BUPIVACAINE-EPINEPHRINE (PF) 0.25% -1:200000 IJ SOLN
INTRAMUSCULAR | Status: AC
  Filled 2018-01-17: qty 30

## 2018-01-17 MED ORDER — SCOPOLAMINE 1 MG/3DAYS TD PT72
MEDICATED_PATCH | TRANSDERMAL | Status: DC | PRN
  Administered 2018-01-17: 1 via TRANSDERMAL

## 2018-01-17 MED ORDER — FENTANYL CITRATE (PF) 100 MCG/2ML IJ SOLN: 25.0000 ug | INTRAMUSCULAR | Status: DC | PRN

## 2018-01-17 MED ORDER — PROPOFOL 10 MG/ML IV BOLUS
INTRAVENOUS | Status: DC | PRN
  Administered 2018-01-17: 200 mg via INTRAVENOUS

## 2018-01-17 MED ORDER — SUGAMMADEX SODIUM 200 MG/2ML IV SOLN
INTRAVENOUS | Status: DC | PRN
  Administered 2018-01-17: 200 mg via INTRAVENOUS

## 2018-01-17 MED ORDER — DEXAMETHASONE SODIUM PHOSPHATE 4 MG/ML IJ SOLN
INTRAMUSCULAR | Status: DC | PRN
  Administered 2018-01-17: 8 mg via INTRAVENOUS

## 2018-01-17 MED ORDER — 0.9 % SODIUM CHLORIDE (POUR BTL) OPTIME
TOPICAL | Status: DC | PRN
  Administered 2018-01-17: 1000 mL

## 2018-01-17 MED ORDER — MEPERIDINE HCL 50 MG/ML IJ SOLN: 6.2500 mg | INTRAMUSCULAR | Status: DC | PRN

## 2018-01-17 MED ORDER — ACETAMINOPHEN 500 MG PO TABS
1000.0000 mg | ORAL_TABLET | ORAL | Status: AC
  Administered 2018-01-17: 1000 mg via ORAL

## 2018-01-17 MED ORDER — GABAPENTIN 300 MG PO CAPS
300.0000 mg | ORAL_CAPSULE | ORAL | Status: AC
  Administered 2018-01-17: 300 mg via ORAL

## 2018-01-17 MED ORDER — CHLORHEXIDINE GLUCONATE CLOTH 2 % EX PADS: 6.0000 | MEDICATED_PAD | Freq: Once | CUTANEOUS | Status: DC

## 2018-01-17 MED ORDER — MIDAZOLAM HCL 2 MG/2ML IJ SOLN
INTRAMUSCULAR | Status: AC
  Filled 2018-01-17: qty 2

## 2018-01-17 MED ORDER — ROCURONIUM BROMIDE 50 MG/5ML IV SOLN
INTRAVENOUS | Status: AC
  Filled 2018-01-17: qty 1

## 2018-01-17 MED ORDER — SUCCINYLCHOLINE CHLORIDE 20 MG/ML IJ SOLN
INTRAMUSCULAR | Status: DC | PRN
  Administered 2018-01-17: 120 mg via INTRAVENOUS

## 2018-01-17 MED ORDER — FENTANYL CITRATE (PF) 250 MCG/5ML IJ SOLN
INTRAMUSCULAR | Status: AC
  Filled 2018-01-17: qty 5

## 2018-01-17 MED ORDER — OXYCODONE HCL 5 MG PO TABS: 5.0000 mg | ORAL_TABLET | Freq: Once | ORAL | Status: DC | PRN

## 2018-01-17 MED ORDER — ACETAMINOPHEN 160 MG/5ML PO SOLN: 325.0000 mg | ORAL | Status: DC | PRN

## 2018-01-17 MED ORDER — ONDANSETRON HCL 4 MG/2ML IJ SOLN: 4.0000 mg | Freq: Once | INTRAMUSCULAR | Status: DC | PRN

## 2018-01-17 MED ORDER — ONDANSETRON HCL 4 MG/2ML IJ SOLN
INTRAMUSCULAR | Status: AC
  Filled 2018-01-17: qty 2

## 2018-01-17 MED ORDER — LACTATED RINGERS IV SOLN
INTRAVENOUS | Status: DC | PRN
  Administered 2018-01-17: 07:00:00 via INTRAVENOUS

## 2018-01-17 MED ORDER — GABAPENTIN 300 MG PO CAPS
ORAL_CAPSULE | ORAL | Status: AC
  Administered 2018-01-17: 300 mg via ORAL
  Filled 2018-01-17: qty 1

## 2018-01-17 MED ORDER — ROCURONIUM BROMIDE 100 MG/10ML IV SOLN
INTRAVENOUS | Status: DC | PRN
  Administered 2018-01-17: 30 mg via INTRAVENOUS

## 2018-01-17 MED ORDER — ONDANSETRON HCL 4 MG/2ML IJ SOLN
INTRAMUSCULAR | Status: DC | PRN
  Administered 2018-01-17: 4 mg via INTRAVENOUS

## 2018-01-17 MED ORDER — MIDAZOLAM HCL 5 MG/5ML IJ SOLN
INTRAMUSCULAR | Status: DC | PRN
  Administered 2018-01-17: 1 mg via INTRAVENOUS

## 2018-01-17 MED ORDER — SCOPOLAMINE 1 MG/3DAYS TD PT72
MEDICATED_PATCH | TRANSDERMAL | Status: AC
  Filled 2018-01-17: qty 1

## 2018-01-17 MED ORDER — FENTANYL CITRATE (PF) 100 MCG/2ML IJ SOLN
INTRAMUSCULAR | Status: DC | PRN
  Administered 2018-01-17: 100 ug via INTRAVENOUS

## 2018-01-17 MED ORDER — DEXAMETHASONE SODIUM PHOSPHATE 10 MG/ML IJ SOLN
INTRAMUSCULAR | Status: AC
  Filled 2018-01-17: qty 1

## 2018-01-17 MED ORDER — SUGAMMADEX SODIUM 200 MG/2ML IV SOLN
INTRAVENOUS | Status: AC
  Filled 2018-01-17: qty 2

## 2018-01-17 MED ORDER — ACETAMINOPHEN 500 MG PO TABS
ORAL_TABLET | ORAL | Status: AC
  Administered 2018-01-17: 1000 mg via ORAL
  Filled 2018-01-17: qty 2

## 2018-01-17 MED ORDER — HYDROCODONE-ACETAMINOPHEN 10-325 MG PO TABS: 1.0000 | ORAL_TABLET | Freq: Four times a day (QID) | ORAL | 0 refills | Status: AC | PRN

## 2018-01-17 MED ORDER — BUPIVACAINE-EPINEPHRINE (PF) 0.25% -1:200000 IJ SOLN
INTRAMUSCULAR | Status: DC | PRN
  Administered 2018-01-17: 34 mL via INTRAMUSCULAR

## 2018-01-17 MED ORDER — PROPOFOL 10 MG/ML IV BOLUS
INTRAVENOUS | Status: AC
  Filled 2018-01-17: qty 20

## 2018-01-17 MED ORDER — LIDOCAINE HCL (PF) 1 % IJ SOLN
INTRAMUSCULAR | Status: AC
  Filled 2018-01-17: qty 30

## 2018-01-17 SURGICAL SUPPLY — 54 items
BENZOIN TINCTURE PRP APPL 2/3 (GAUZE/BANDAGES/DRESSINGS) ×2 IMPLANT
BIOPATCH RED 1 DISK 7.0 (GAUZE/BANDAGES/DRESSINGS) ×2 IMPLANT
CANISTER SUCT 3000ML PPV (MISCELLANEOUS) ×2 IMPLANT
CHLORAPREP W/TINT 26ML (MISCELLANEOUS) ×2 IMPLANT
CONT SPEC 4OZ CLIKSEAL STRL BL (MISCELLANEOUS) ×2 IMPLANT
COVER SURGICAL LIGHT HANDLE (MISCELLANEOUS) ×2 IMPLANT
DRAIN CHANNEL 19F RND (DRAIN) ×2 IMPLANT
DRAPE LAPAROTOMY 100X72 PEDS (DRAPES) ×2 IMPLANT
DRAPE UTILITY XL STRL (DRAPES) ×4 IMPLANT
DRSG TEGADERM 2-3/8X2-3/4 SM (GAUZE/BANDAGES/DRESSINGS) ×2 IMPLANT
DRSG TEGADERM 4X4.75 (GAUZE/BANDAGES/DRESSINGS) ×4 IMPLANT
ELECT CAUTERY BLADE 6.4 (BLADE) ×2 IMPLANT
ELECT REM PT RETURN 9FT ADLT (ELECTROSURGICAL) ×2
ELECTRODE REM PT RTRN 9FT ADLT (ELECTROSURGICAL) ×1 IMPLANT
EVACUATOR SILICONE 100CC (DRAIN) ×2 IMPLANT
GAUZE SPONGE 4X4 12PLY STRL (GAUZE/BANDAGES/DRESSINGS) ×2 IMPLANT
GLOVE BIO SURGEON STRL SZ 6 (GLOVE) ×2 IMPLANT
GLOVE BIOGEL PI IND STRL 6.5 (GLOVE) ×1 IMPLANT
GLOVE BIOGEL PI IND STRL 7.0 (GLOVE) ×1 IMPLANT
GLOVE BIOGEL PI INDICATOR 6.5 (GLOVE) ×1
GLOVE BIOGEL PI INDICATOR 7.0 (GLOVE) ×1
GLOVE INDICATOR 6.5 STRL GRN (GLOVE) ×2 IMPLANT
GLOVE SS BIOGEL STRL SZ 7 (GLOVE) ×1 IMPLANT
GLOVE SUPERSENSE BIOGEL SZ 7 (GLOVE) ×1
GOWN STRL REUS W/ TWL LRG LVL3 (GOWN DISPOSABLE) ×2 IMPLANT
GOWN STRL REUS W/TWL 2XL LVL3 (GOWN DISPOSABLE) ×2 IMPLANT
GOWN STRL REUS W/TWL LRG LVL3 (GOWN DISPOSABLE) ×2
KIT BASIN OR (CUSTOM PROCEDURE TRAY) ×2 IMPLANT
KIT TURNOVER KIT B (KITS) ×2 IMPLANT
NEEDLE HYPO 25GX1X1/2 BEV (NEEDLE) ×2 IMPLANT
NS IRRIG 1000ML POUR BTL (IV SOLUTION) ×2 IMPLANT
PACK SURGICAL SETUP 50X90 (CUSTOM PROCEDURE TRAY) ×2 IMPLANT
PAD ARMBOARD 7.5X6 YLW CONV (MISCELLANEOUS) ×4 IMPLANT
PENCIL BUTTON HOLSTER BLD 10FT (ELECTRODE) ×2 IMPLANT
SPECIMEN JAR SMALL (MISCELLANEOUS) ×2 IMPLANT
SPONGE LAP 18X18 X RAY DECT (DISPOSABLE) ×4 IMPLANT
STRIP CLOSURE SKIN 1/2X4 (GAUZE/BANDAGES/DRESSINGS) ×2 IMPLANT
SUT ETHILON 2 0 FS 18 (SUTURE) ×2 IMPLANT
SUT ETHILON 3 0 FSL (SUTURE) ×2 IMPLANT
SUT MON AB 3-0 SH 27 (SUTURE) ×1
SUT MON AB 3-0 SH27 (SUTURE) ×1 IMPLANT
SUT MON AB 4-0 PC3 18 (SUTURE) IMPLANT
SUT SILK 2 0 PERMA HAND 18 BK (SUTURE) IMPLANT
SUT VIC AB 2-0 SH 27 (SUTURE) ×2
SUT VIC AB 2-0 SH 27XBRD (SUTURE) ×2 IMPLANT
SUT VIC AB 3-0 SH 27 (SUTURE)
SUT VIC AB 3-0 SH 27X BRD (SUTURE) IMPLANT
SUT VIC AB 3-0 SH 8-18 (SUTURE) ×2 IMPLANT
SYR BULB 3OZ (MISCELLANEOUS) ×2 IMPLANT
SYR CONTROL 10ML LL (SYRINGE) ×2 IMPLANT
TAPE CLOTH SURG 4X10 WHT LF (GAUZE/BANDAGES/DRESSINGS) ×2 IMPLANT
TOWEL GREEN STERILE FF (TOWEL DISPOSABLE) ×2 IMPLANT
TUBE CONNECTING 12X1/4 (SUCTIONS) ×2 IMPLANT
YANKAUER SUCT BULB TIP NO VENT (SUCTIONS) ×2 IMPLANT

## 2018-01-17 NOTE — Op Note (Signed)
PRE-OPERATIVE DIAGNOSIS: back mass, subcutaneous, 10 cm  POST-OPERATIVE DIAGNOSIS:  Same  PROCEDURE:  Procedure(s): Excision of 10 cm subcutaneous upper back mass  SURGEON:  Surgeon(s): Stark Klein, MD  ANESTHESIA:   local and general  DRAINS: (19 Fr) Blake drain(s) in the surgical cavity.  LOCAL MEDICATIONS USED:  BUPIVICAINE  and LIDOCAINE   SPECIMEN:  Source of Specimen:  upper back mass  DISPOSITION OF SPECIMEN:  PATHOLOGY  COUNTS:  YES  DICTATION: .Dragon Dictation  PLAN OF CARE: Discharge to home after PACU  PATIENT DISPOSITION:  PACU - hemodynamically stable.  FINDINGS:  Fatty back mass, 10 cm  EBL: min  PROCEDURE:  Patient was identified in the holding area.  He was taken to the operating room where she was placed supine on the operating room table.  General anesthesia was induced.  She was then placed into the lateral position.  The upper back and lower neck were prepped and draped in sterile fashion.  A timeout was performed according to the surgical safety checklist.  When all was correct, we continued.    A transverse incision was made over the mass.  The cautery was used to create flaps around the mass.  The deeper pedicle of the mass was also taken with cautery.  The mass was then measured and passed off the table.  A drain was placed in the deeper cavity where the mass had been located due to the size.  This was brought out the left side as her right shoulder has restricted range of motion secondary to arthritis and breast cancer treatment.  This was secured with 2-0 nylon.     This was then closed and 4 layers with deep interrupted 2-0 Vicryl sutures followed by deep dermal 3-0 Vicryl interrupted sutures.  This was then followed by running subcuticular 4-0 Monocryl suture.  The wound was cleaned, dried, and dressed with benzoin and Steri-Strips.  This was then covered with gauze and Tegaderm.  A drain dressing was applied.    The patient was allowed to return  to supine position.  She was allowed to emerge from anesthesia.  She was taken to the PACU in stable condition.  Needle, sponge, and instrument counts were correct x2.

## 2018-01-17 NOTE — Discharge Instructions (Addendum)
Surgical Drain Home Care °Surgical drains are used to remove extra fluid that normally builds up in a surgical wound after surgery. A surgical drain helps to heal a surgical wound. Different kinds of surgical drains include: °· Active drains. These drains use suction to pull drainage away from the surgical wound. Drainage flows through a tube to a container outside of the body. It is important to keep the bulb or the drainage container flat (compressed) at all times, except while you empty it. Flattening the bulb or container creates suction. The two most common types of active drains are bulb drains and Hemovac drains. °· Passive drains. These drains allow fluid to drain naturally, by gravity. Drainage flows through a tube to a bandage (dressing) or a container outside of the body. Passive drains do not need to be emptied. The most common type of passive drain is the Penrose drain. ° °A drain is placed during surgery. Immediately after surgery, drainage is usually bright red and a little thicker than water. The drainage may gradually turn yellow or pink and become thinner. It is likely that your health care provider will remove the drain when the drainage stops or when the amount decreases to 1-2 Tbsp (15-30 mL) during a 24-hour period. °How to care for your surgical drain °· Keep the skin around the drain dry and covered with a dressing at all times. °· Check your drain area every day for signs of infection. Check for: °? More redness, swelling, or pain. °? Pus or a bad smell. °? Cloudy drainage. °Follow instructions from your health care provider about how to take care of your drain and how to change your dressing. Change your dressing at least one time every day. Change it more often if needed to keep the dressing dry. Make sure you: °1. Gather your supplies, including: °? Tape. °? Germ-free cleaning solution (sterile saline). °? Split gauze drain sponge: 4 x 4 inches (10 x 10 cm). °? Gauze square: 4 x 4 inches  (10 x 10 cm). °2. Wash your hands with soap and water before you change your dressing. If soap and water are not available, use hand sanitizer. °3. Remove the old dressing. Avoid using scissors to do that. °4. Use sterile saline to clean your skin around the drain. °5. Place the tube through the slit in a drain sponge. Place the drain sponge so that it covers your wound. °6. Place the gauze square or another drain sponge on top of the drain sponge that is on the wound. Make sure the tube is between those layers. °7. Tape the dressing to your skin. °8. If you have an active bulb or Hemovac drain, tape the drainage tube to your skin 1-2 inches (2.5-5 cm) below the place where the tube enters your body. Taping keeps the tube from pulling on any stitches (sutures) that you have. °9. Wash your hands with soap and water. °10. Write down the color of your drainage and how often you change your dressing. ° °How to empty your active bulb or Hemovac drain °1. Make sure that you have a measuring cup that you can empty your drainage into. °2. Wash your hands with soap and water. If soap and water are not available, use hand sanitizer. °3. Gently move your fingers down the tube while squeezing very lightly. This is called stripping the tube. This clears any drainage, clots, or tissue from the tube. °? Do not pull on the tube. °? You may need to strip   the tube several times every day to keep the tube clear. 4. Open the bulb cap or the drain plug. Do not touch the inside of the cap or the bottom of the plug. 5. Empty all of the drainage into the measuring cup. 6. Compress the bulb or the container and replace the cap or the plug. To compress the bulb or the container, squeeze it firmly in the middle while you close the cap or plug the container. 7. Write down the amount of drainage that you have in each 24-hour period. If you have less than 2 Tbsp (30 mL) of drainage during 24 hours, contact your health care  provider. 8. Flush the drainage down the toilet. 9. Wash your hands with soap and water. Contact a health care provider if:  You have more redness, swelling, or pain around your drain area.  The amount of drainage that you have is increasing instead of decreasing.  You have pus or a bad smell coming from your drain area.  You have a fever.  You have drainage that is cloudy.  There is a sudden stop or a sudden decrease in the amount of drainage that you have.  Your tube falls out.  Your active draindoes not stay compressedafter you empty it. This information is not intended to replace advice given to you by your health care provider. Make sure you discuss any questions you have with your health care provider. Document Released: 07/09/2000 Document Revised: 12/18/2015 Document Reviewed: 01/29/2015 Elsevier Interactive Patient Education  2018 [place] Office Phone Number [phone]   POST OP INSTRUCTIONS  Always review your discharge instruction sheet given to you by the facility where your surgery was performed.  IF YOU HAVE DISABILITY OR FAMILY LEAVE FORMS, YOU MUST BRING THEM TO THE OFFICE FOR PROCESSING.  DO NOT GIVE THEM TO YOUR DOCTOR.  1. A prescription for pain medication may be given to you upon discharge.  Take your pain medication as prescribed, if needed.  If narcotic pain medicine is not needed, then you may take acetaminophen (Tylenol) or ibuprofen (Advil) as needed. 2. Take your usually prescribed medications unless otherwise directed 3. If you need a refill on your pain medication, please contact your pharmacy.  They will contact our office to request authorization.  Prescriptions will not be filled after 5pm or on week-ends. 4. You should eat very light the first 24 hours after surgery, such as soup, crackers, pudding, etc.  Resume your normal diet the day after surgery 5. It is common to experience some constipation if  taking pain medication after surgery.  Increasing fluid intake and taking a stool softener will usually help or prevent this problem from occurring.  A mild laxative (Milk of Magnesia or Miralax) should be taken according to package directions if there are no bowel movements after 48 hours. 6. You may shower in 48 hours.  The surgical glue will flake off in 2-3 weeks.   7. ACTIVITIES:  No strenuous activity or heavy lifting for 1 week.   a. You may drive when you no longer are taking prescription pain medication, you can comfortably wear a seatbelt, and you can safely maneuver your car and apply brakes. b. RETURN TO WORK:  __________to be determined._______________ Kelli Abbott should see your doctor in the office for a follow-up appointment approximately three-four weeks after your surgery.    WHEN TO CALL YOUR DOCTOR: 1. Fever over 101.0 2. Nausea and/or vomiting. 3. Extreme swelling or  bruising. 4. Continued bleeding from incision. 5. Increased pain, redness, or drainage from the incision.  The clinic staff is available to answer your questions during regular business hours.  Please dont hesitate to call and ask to speak to one of the nurses for clinical concerns.  If you have a medical emergency, go to the nearest emergency room or call 911.  A surgeon from [facility] is always on call at the hospital.  For further questions, please visit [email or web address]

## 2018-01-17 NOTE — Anesthesia Postprocedure Evaluation (Signed)
Anesthesia Post Note  Patient: Kelli Abbott  Procedure(s) Performed: EXCISION  OF UPPER BACK MASS AND SKIN TAGS (N/A Back)     Patient location during evaluation: PACU Anesthesia Type: General Level of consciousness: awake and alert Pain management: pain level controlled Vital Signs Assessment: post-procedure vital signs reviewed and stable Respiratory status: spontaneous breathing, nonlabored ventilation, respiratory function stable and patient connected to nasal cannula oxygen Cardiovascular status: blood pressure returned to baseline and stable Postop Assessment: no apparent nausea or vomiting Anesthetic complications: no    Last Vitals:  Vitals:   01/17/18 0953 01/17/18 1003  BP: (!) 109/94 (!) 142/88  Pulse: 78 76  Resp: 19 18  Temp: (!) 36.3 C   SpO2: 100% 97%    Last Pain:  Vitals:   01/17/18 1003  TempSrc:   PainSc: 0-No pain                 [name]

## 2018-01-17 NOTE — Interval H&P Note (Signed)
History and Physical Interval Note:  01/17/2018 7:42 AM  Kelli Abbott  has presented today for surgery, with the diagnosis of Back Mass  The various methods of treatment have been discussed with the patient and family. After consideration of risks, benefits and other options for treatment, the patient has consented to  Procedure(s): EXCISION  OF UPPER BACK MASS (N/A) as a surgical intervention .  The patient's history has been reviewed, patient examined, no change in status, stable for surgery.  I have reviewed the patient's chart and labs.  Questions were answered to the patient's satisfaction.     Stark Klein

## 2018-01-17 NOTE — Anesthesia Procedure Notes (Signed)
Procedure Name: Intubation Date/Time: 01/17/2018 7:58 AM Performed by: Izora Gala, CRNA Pre-anesthesia Checklist: Patient identified, Emergency Drugs available, Suction available and Patient being monitored Patient Re-evaluated:Patient Re-evaluated prior to induction Oxygen Delivery Method: Circle system utilized Preoxygenation: Pre-oxygenation with 100% oxygen Induction Type: IV induction Ventilation: Mask ventilation without difficulty and Oral airway inserted - appropriate to patient size Laryngoscope Size: Miller and 3 Grade View: Grade I Tube type: Oral (No difficulty viewing vocal cords clearly.  View verified by Dr. Ambrose Pancoast.) Tube size: 7.0 mm Number of attempts: 1 Airway Equipment and Method: Stylet Placement Confirmation: ETT inserted through vocal cords under direct vision,  positive ETCO2 and breath sounds checked- equal and bilateral Secured at: 22 cm Tube secured with: Tape Dental Injury: Teeth and Oropharynx as per pre-operative assessment

## 2018-01-17 NOTE — Transfer of Care (Signed)
Immediate Anesthesia Transfer of Care Note  Patient: RECHEL DELOSREYES  Procedure(s) Performed: EXCISION  OF UPPER BACK MASS AND SKIN TAGS (N/A Back)  Patient Location: PACU  Anesthesia Type:General  Level of Consciousness: awake, alert  and patient cooperative  Airway & Oxygen Therapy: Patient Spontanous Breathing and Patient connected to nasal cannula oxygen  Post-op Assessment: Report given to RN, Post -op Vital signs reviewed and stable and Patient moving all extremities  Post vital signs: Reviewed and stable  Last Vitals:  Vitals Value Taken Time  BP 183/106 01/17/2018  9:36 AM  Temp 36.3 C 01/17/2018  9:34 AM  Pulse 83 01/17/2018  9:35 AM  Resp 17 01/17/2018  9:35 AM  SpO2 99 % 01/17/2018  9:35 AM  Vitals shown include unvalidated device data.  Last Pain:  Vitals:   01/17/18 0934  TempSrc:   PainSc: (P) 0-No pain         Complications: No apparent anesthesia complications

## 2018-01-18 ENCOUNTER — Encounter (HOSPITAL_COMMUNITY): Payer: Self-pay | Admitting: General Surgery

## 2018-01-31 ENCOUNTER — Other Ambulatory Visit: Payer: Self-pay | Admitting: General Surgery

## 2018-01-31 DIAGNOSIS — N6310 Unspecified lump in the right breast, unspecified quadrant: Secondary | ICD-10-CM

## 2018-02-01 ENCOUNTER — Ambulatory Visit: Admission: RE | Admit: 2018-02-01 | Payer: Medicare Other | Source: Ambulatory Visit

## 2018-02-01 ENCOUNTER — Ambulatory Visit
Admission: RE | Admit: 2018-02-01 | Discharge: 2018-02-01 | Disposition: A | Discharge status: Home / Self Care | Payer: Medicare Other | Source: Ambulatory Visit | Attending: General Surgery | Admitting: General Surgery

## 2018-02-01 DIAGNOSIS — N6310 Unspecified lump in the right breast, unspecified quadrant: Secondary | ICD-10-CM

## 2018-04-07 ENCOUNTER — Other Ambulatory Visit: Payer: Self-pay | Admitting: General Surgery

## 2018-04-07 DIAGNOSIS — L7634 Postprocedural seroma of skin and subcutaneous tissue following other procedure: Secondary | ICD-10-CM

## 2018-04-18 ENCOUNTER — Other Ambulatory Visit: Payer: Medicare Other

## 2018-04-25 ENCOUNTER — Ambulatory Visit
Admission: RE | Admit: 2018-04-25 | Discharge: 2018-04-25 | Disposition: A | Discharge status: Home / Self Care | Payer: Medicare Other | Source: Ambulatory Visit | Attending: General Surgery | Admitting: General Surgery

## 2018-04-25 DIAGNOSIS — L7634 Postprocedural seroma of skin and subcutaneous tissue following other procedure: Secondary | ICD-10-CM

## 2018-05-01 ENCOUNTER — Other Ambulatory Visit: Payer: Self-pay | Admitting: Geriatric Medicine

## 2018-05-01 DIAGNOSIS — M79652 Pain in left thigh: Secondary | ICD-10-CM

## 2018-05-02 ENCOUNTER — Ambulatory Visit
Admission: RE | Admit: 2018-05-02 | Discharge: 2018-05-02 | Disposition: A | Discharge status: Home / Self Care | Payer: Medicare Other | Source: Ambulatory Visit | Attending: Geriatric Medicine | Admitting: Geriatric Medicine

## 2018-05-02 DIAGNOSIS — M79652 Pain in left thigh: Secondary | ICD-10-CM

## 2018-05-17 ENCOUNTER — Other Ambulatory Visit: Payer: Self-pay | Admitting: Internal Medicine

## 2018-05-17 DIAGNOSIS — Z1231 Encounter for screening mammogram for malignant neoplasm of breast: Secondary | ICD-10-CM

## 2018-06-28 ENCOUNTER — Ambulatory Visit: Payer: Medicare Other

## 2018-08-10 ENCOUNTER — Ambulatory Visit
Admission: RE | Admit: 2018-08-10 | Discharge: 2018-08-10 | Disposition: A | Discharge status: Home / Self Care | Payer: Medicare Other | Source: Ambulatory Visit | Attending: Internal Medicine | Admitting: Internal Medicine

## 2018-08-10 ENCOUNTER — Ambulatory Visit: Payer: Medicare Other

## 2018-08-10 ENCOUNTER — Other Ambulatory Visit: Payer: Self-pay | Admitting: Internal Medicine

## 2018-08-10 DIAGNOSIS — Z1231 Encounter for screening mammogram for malignant neoplasm of breast: Secondary | ICD-10-CM

## 2018-10-17 ENCOUNTER — Ambulatory Visit: Payer: Medicare Other | Admitting: Physical Therapy

## 2018-11-13 ENCOUNTER — Ambulatory Visit
Admission: RE | Admit: 2018-11-13 | Discharge: 2018-11-13 | Disposition: A | Discharge status: Home / Self Care | Payer: Medicare Other | Source: Ambulatory Visit | Attending: Internal Medicine | Admitting: Internal Medicine

## 2018-11-13 ENCOUNTER — Other Ambulatory Visit: Payer: Self-pay

## 2018-11-13 ENCOUNTER — Other Ambulatory Visit: Payer: Self-pay | Admitting: Internal Medicine

## 2018-11-13 DIAGNOSIS — M545 Low back pain, unspecified: Secondary | ICD-10-CM

## 2018-11-25 IMAGING — MG DIGITAL DIAGNOSTIC UNILATERAL RIGHT MAMMOGRAM WITH TOMO AND CAD
4 series · 4 of 12 positions shown · non-contrast
Comparison: Previous exam(s).

CLINICAL DATA: Patient has a history of a right lumpectomy for
breast carcinoma in May 2008.She currently has pain/soreness
along the lateral aspect of the right breast near the lumpectomy
site. This has been present since approximately [place] year.

EXAM:
DIGITAL DIAGNOSTIC UNILATERAL RIGHT MAMMOGRAM WITH CAD AND TOMO

[R MLO synth-2D (1 of 2)]
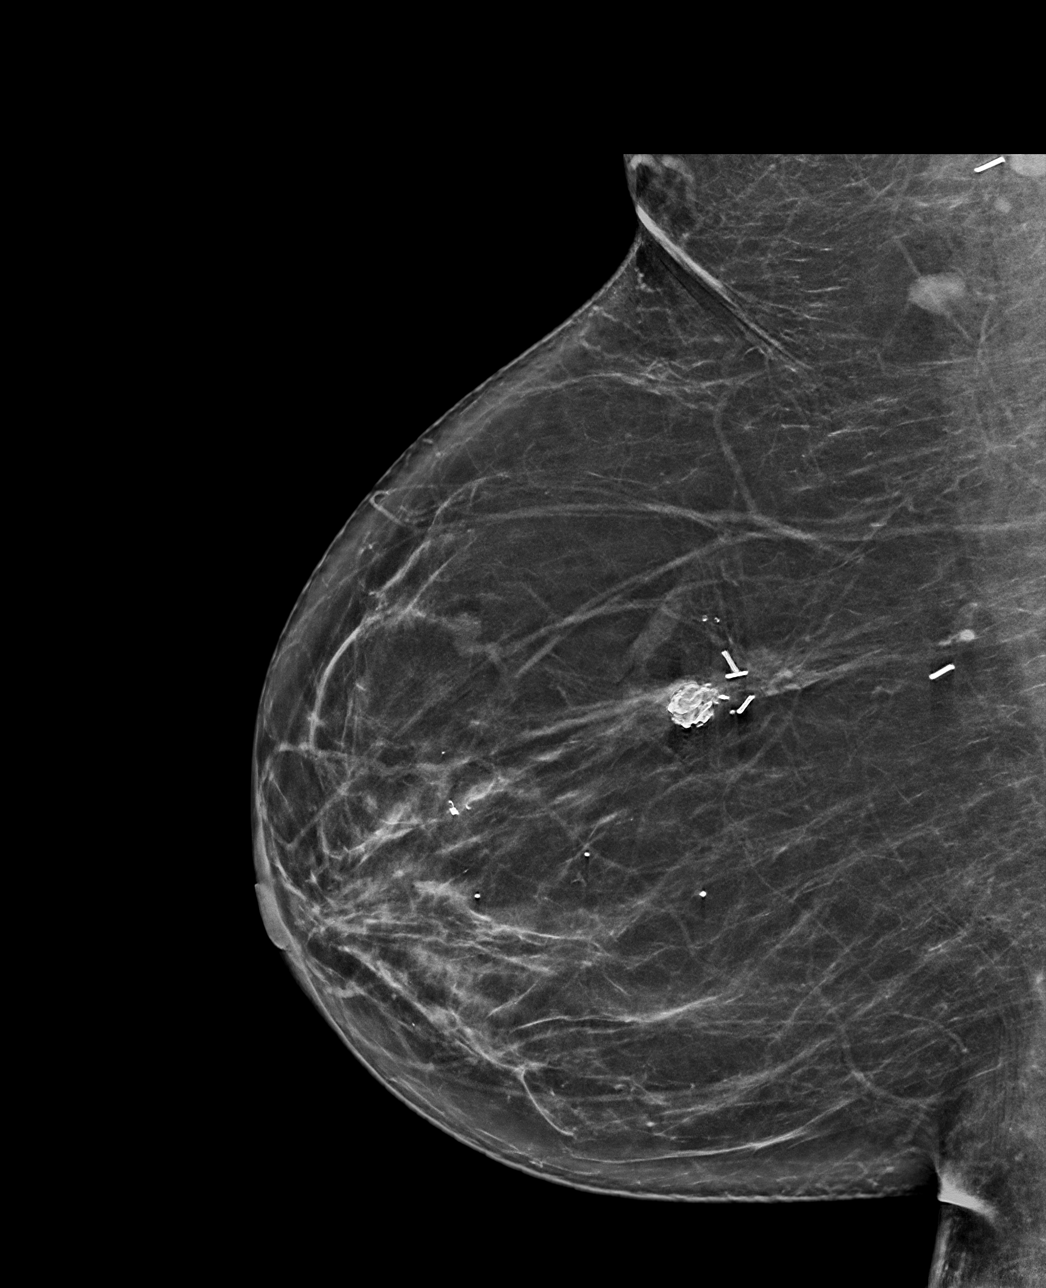

[R MLO synth-2D (2 of 2)]
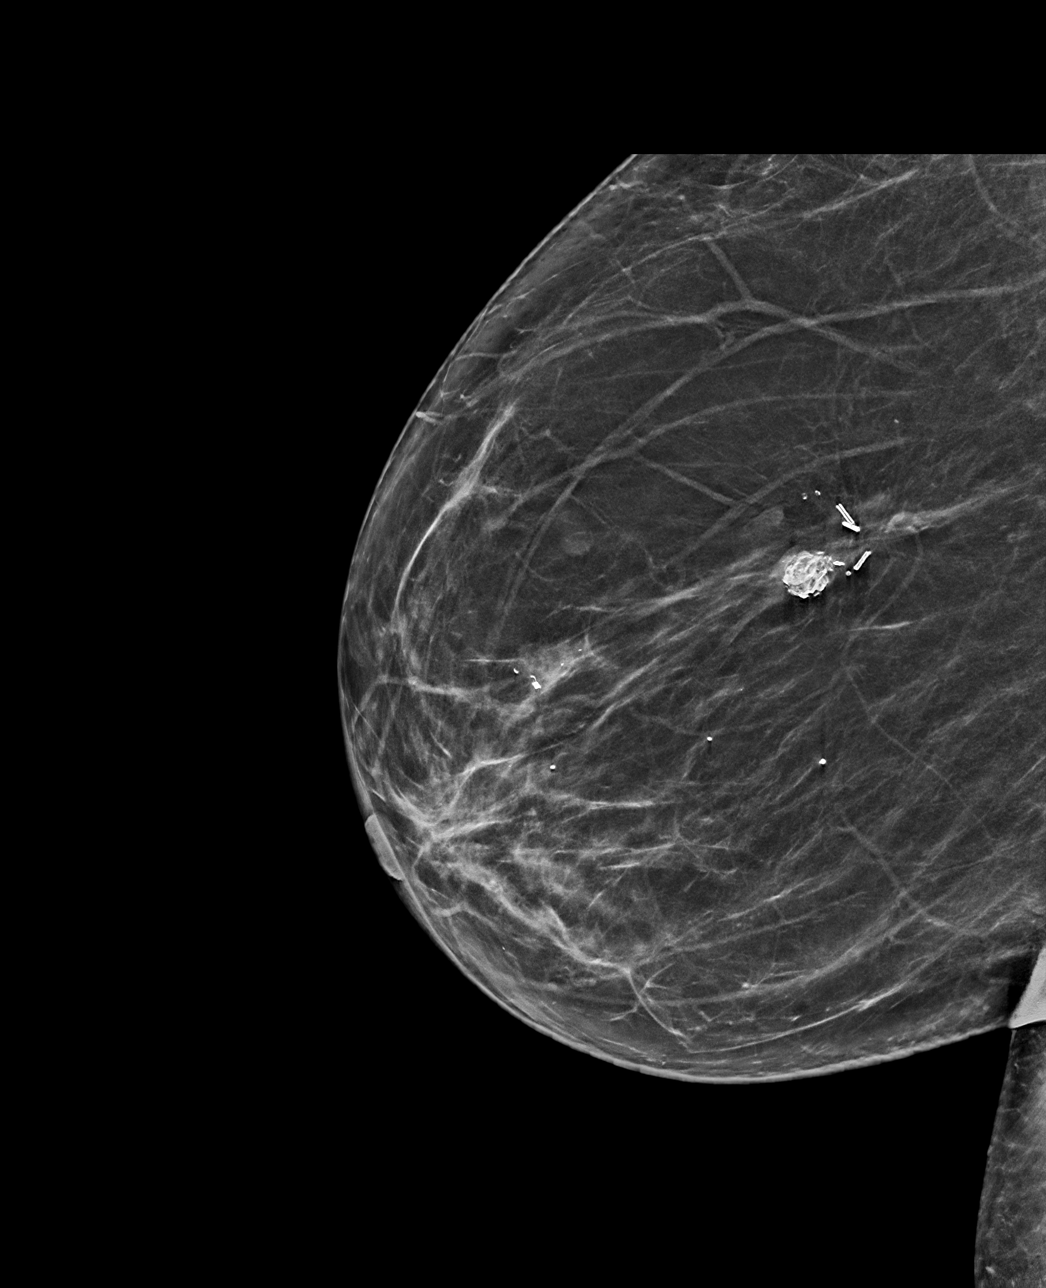

[R MLO tomo (1 of 2) · tomo slice 39/77.0]
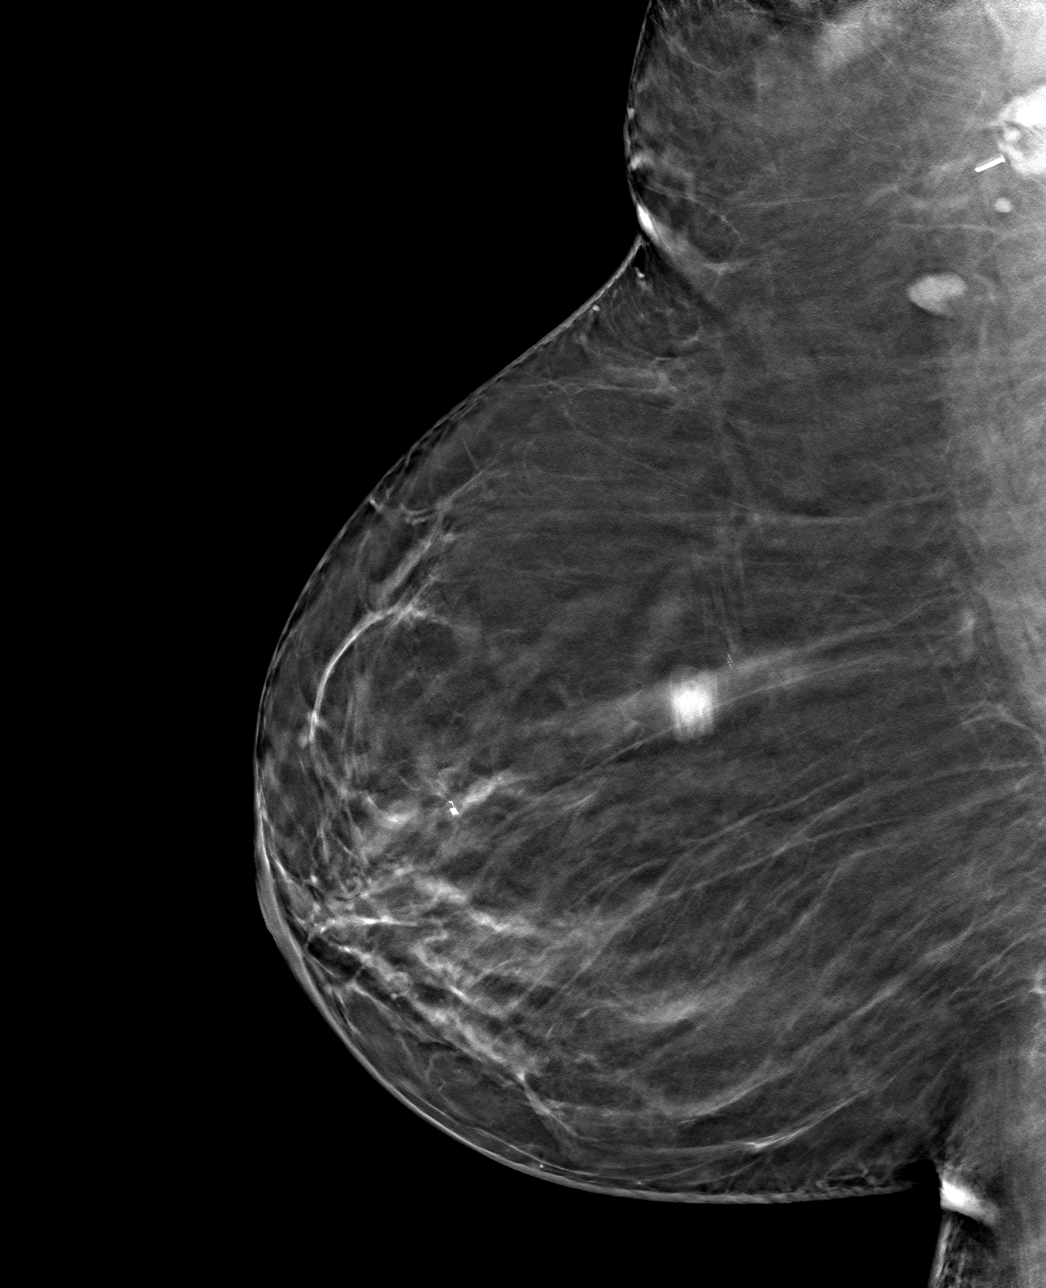

[R MLO tomo (2 of 2) · tomo slice 36/71.0]
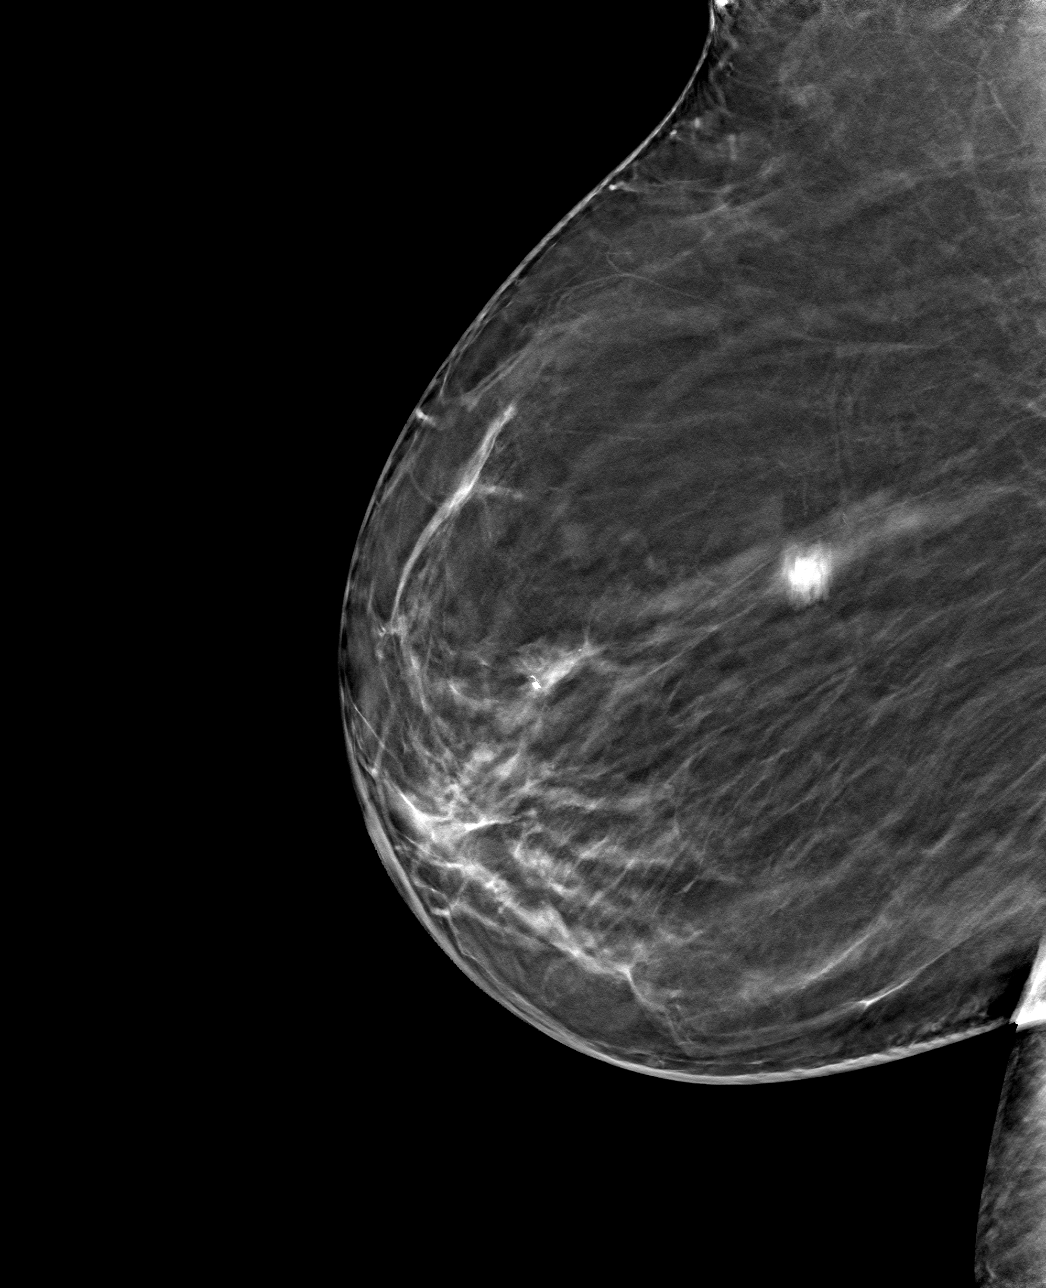

[4 of 12 positions shown; findings below may reference images not displayed]

ACR Breast Density Category b: There are scattered areas of
fibroglandular density.
FINDINGS: There is postsurgical architectural distortion with associated
dystrophic calcifications in the lateral lumpectomy site, stable
from recent prior exams. There are no discrete masses or other areas
of architectural distortion. There are no new or suspicious
calcifications.

Mammographic images were processed with CAD.
IMPRESSION: 1. No evidence of recurrent or new breast malignancy.
2. Benign postsurgical changes lateral right breast.

RECOMMENDATION:
Screening mammogram in May 2018 per normal screening
schedule.(Code:[id number])

I have discussed the findings and recommendations with the patient.
Results were also provided in writing at the conclusion of the
visit. If applicable, a reminder letter will be sent to the patient
regarding the next appointment.

BI-RADS CATEGORY  2: Benign.

## 2018-12-20 ENCOUNTER — Ambulatory Visit: Payer: Medicare Other | Attending: General Surgery | Admitting: Physical Therapy

## 2019-03-15 ENCOUNTER — Ambulatory Visit
Admission: RE | Admit: 2019-03-15 | Discharge: 2019-03-15 | Disposition: A | Discharge status: Home / Self Care | Payer: Medicare Other | Source: Ambulatory Visit | Attending: Internal Medicine | Admitting: Internal Medicine

## 2019-03-15 ENCOUNTER — Other Ambulatory Visit: Payer: Self-pay | Admitting: Internal Medicine

## 2019-03-15 DIAGNOSIS — M25562 Pain in left knee: Secondary | ICD-10-CM

## 2019-04-20 ENCOUNTER — Other Ambulatory Visit (HOSPITAL_COMMUNITY): Payer: Self-pay | Admitting: Nurse Practitioner

## 2019-04-20 ENCOUNTER — Other Ambulatory Visit: Payer: Self-pay | Admitting: Nurse Practitioner

## 2019-04-20 DIAGNOSIS — G8929 Other chronic pain: Secondary | ICD-10-CM

## 2019-04-20 DIAGNOSIS — M25562 Pain in left knee: Secondary | ICD-10-CM

## 2019-04-20 DIAGNOSIS — M549 Dorsalgia, unspecified: Secondary | ICD-10-CM

## 2019-04-25 ENCOUNTER — Ambulatory Visit: Payer: Medicare Other | Admitting: Orthopaedic Surgery

## 2019-04-26 ENCOUNTER — Ambulatory Visit (HOSPITAL_COMMUNITY): Payer: Medicare Other

## 2019-04-27 ENCOUNTER — Ambulatory Visit (HOSPITAL_COMMUNITY)
Admission: RE | Admit: 2019-04-27 | Discharge: 2019-04-27 | Disposition: A | Discharge status: Home / Self Care | Payer: Medicare Other | Source: Ambulatory Visit | Attending: Nurse Practitioner | Admitting: Nurse Practitioner

## 2019-04-27 ENCOUNTER — Other Ambulatory Visit: Payer: Self-pay

## 2019-04-27 DIAGNOSIS — M25562 Pain in left knee: Secondary | ICD-10-CM | POA: Insufficient documentation

## 2019-05-01 ENCOUNTER — Ambulatory Visit (INDEPENDENT_AMBULATORY_CARE_PROVIDER_SITE_OTHER): Payer: Medicare Other | Admitting: Orthopaedic Surgery

## 2019-05-01 ENCOUNTER — Encounter: Payer: Self-pay | Admitting: Orthopaedic Surgery

## 2019-05-01 VITALS — Ht 63.0 in | Wt 234.0 lb

## 2019-05-01 DIAGNOSIS — M1712 Unilateral primary osteoarthritis, left knee: Secondary | ICD-10-CM

## 2019-05-01 NOTE — Progress Notes (Signed)
Office Visit Note   Patient: Kelli Abbott           Date of Birth: [birth year]           MRN: [id number] Visit Date: 05/01/2019              Requested by: Wenda Low, MD [place] [employer] [address],  [place] [address] PCP: Wenda Low, MD   Assessment & Plan: Visit Diagnoses:  1. Primary osteoarthritis of left knee     Plan: Due to the fact the patient has failed conservative treatment which consisted of oral medications,, injections both cortisone and supplemental injections and continues to have left knee pain that is affecting her daily activities along with the radiographic findings of severe patellofemoral arthritis and moderate medial lateral compartment arthritic changes.  Recommend left total knee arthroplasty.  Risk benefits surgery discussed with patient by Dr. Ninfa Linden and myself.  She would like to proceed with surgery in the near future.  Questions encouraged and answered at length.   Follow-Up Instructions: Return 2 weeks postop.   Orders:  No orders of the defined types were placed in this encounter.  No orders of the defined types were placed in this encounter.     Procedures: No procedures performed   Clinical Data: No additional findings.   Subjective: Chief Complaint  Patient presents with   Left Knee - Pain    HPI Kelli Abbott is a 63 year old female were seen for the first time for left knee pain.  She states she has had left knee pain for years.  No known injury.  She is seeing another orthopedic surgeon in town who is given her supplemental injections and cortisone injections.  She tried water aerobics.  She states the knee gives way on her often.  She uses a cane due to the fact that she is afraid she may fall.  She has had no relief for the pain feels her pain is becoming worse.  She is working on weight loss and reports over the last years lost 100 something pounds.  This was weight loss due to diet and exercise.  She states that her  pain management physician referred her to Dr. Ninfa Linden for second opinion.  She has undergone a right total hip arthroplasty several years ago by another physician in town and had a hard time with recovery.  She had come out of work.  Since that time she states that her right hip pain is resolved.  However now her knee is bothering her.  She is also had breast cancer. She did undergo radiographs which are available on canopy and an MRI of the left knee that show tricompartmental arthritic changes with severe patellofemoral changes, moderate chondral thinning medial compartment moderate chondral thinning of the lateral compartment.  No meniscal tear.  Review of Systems Denies any fevers, chills, shortness breath or chest pain.  Objective: Vital Signs: Ht 5\' 3"  (1.6 m)    Wt 234 lb (106.1 kg)    BMI 41.45 kg/m   Physical Exam Constitutional:      Appearance: She is obese. She is not ill-appearing or diaphoretic.  Pulmonary:     Effort: Pulmonary effort is normal.  Neurological:     Mental Status: She is alert and oriented to person, place, and time.  Psychiatric:        Mood and Affect: Mood normal.     Ortho Exam Bilateral knees significant patellofemoral crepitus.  Tenderness along the medial  lateral joint line the left knee.  Both knees hyperextend slightly.  Full flexion of both knees.  No instability valgus varus stressing of either knee no abnormal warmth erythema or effusion of either knee.  Specialty Comments:  No specialty comments available.  Imaging: No results found.   PMFS History: Patient Active Problem List   Diagnosis Date Noted   Primary osteoarthritis of left knee 05/01/2019   Postmenopausal bleeding 11/06/2014   Hypoalbuminemia 08/03/2012   Breast cancer of upper-outer quadrant of right female breast ([place]) 09/03/2011   Past Medical History:  Diagnosis Date   Anemia    Arthritis    Osteoarthritis   Breast cancer ([place])    Breast lump    Breast  nodule    hypoechoic in right breast   Bursitis of shoulder region    Cancer [facility])    breast   Hypertension    Mass on back    Personal history of radiation therapy 2009/2010   PONV (postoperative nausea and vomiting)    Skin lesions, generalized     Family History  Problem Relation Age of Onset   Hypertension Mother    Hypertension Father    Hypertension Brother    Cancer Paternal Aunt    Breast cancer Paternal Aunt     Past Surgical History:  Procedure Laterality Date   BREAST BIOPSY Right 04/29/2008   malignant   BREAST BIOPSY Right 11/24/2015   benign   BREAST LUMPECTOMY Right 06/04/2008   BREAST SURGERY  2009   R breast reexcision of margins   HYSTEROSCOPY W/D&C N/A 11/06/2014   Procedure: DILATATION AND CURETTAGE /HYSTEROSCOPY with resection and myomectomy;  Surgeon: Thurnell Lose, MD;  Location: [facility];  Service: Gynecology;  Laterality: N/A;   MASS EXCISION N/A 01/17/2018   Procedure: EXCISION  OF UPPER BACK MASS AND SKIN TAGS;  Surgeon: Stark Klein, MD;  Location: [facility];  Service: General;  Laterality: N/A;   TOTAL HIP ARTHROPLASTY     right   Social History   Occupational History   Not on file  Tobacco Use   Smoking status: Former Smoker    Packs/day: 1.00    Years: 20.00    Pack years: 20.00    Types: Cigarettes    Quit date: 04/26/2008    Years since quitting: 11.0   Smokeless tobacco: Never Used  Substance and Sexual Activity   Alcohol use: Yes    Comment: less than 1/wk   Drug use: No   Sexual activity: Not on file

## 2019-07-03 ENCOUNTER — Other Ambulatory Visit (HOSPITAL_COMMUNITY)
Admission: RE | Admit: 2019-07-03 | Discharge: 2019-07-03 | Disposition: A | Discharge status: Home / Self Care | Payer: Medicare Other | Source: Ambulatory Visit | Attending: Obstetrics and Gynecology | Admitting: Obstetrics and Gynecology

## 2019-07-03 ENCOUNTER — Other Ambulatory Visit: Payer: Self-pay | Admitting: Obstetrics and Gynecology

## 2019-07-03 DIAGNOSIS — Z124 Encounter for screening for malignant neoplasm of cervix: Secondary | ICD-10-CM | POA: Diagnosis present

## 2019-07-03 DIAGNOSIS — R87612 Low grade squamous intraepithelial lesion on cytologic smear of cervix (LGSIL): Secondary | ICD-10-CM | POA: Insufficient documentation

## 2019-07-03 DIAGNOSIS — Z1151 Encounter for screening for human papillomavirus (HPV): Secondary | ICD-10-CM | POA: Insufficient documentation

## 2019-07-05 ENCOUNTER — Other Ambulatory Visit: Payer: Self-pay | Admitting: Internal Medicine

## 2019-07-05 DIAGNOSIS — Z1231 Encounter for screening mammogram for malignant neoplasm of breast: Secondary | ICD-10-CM

## 2019-07-05 LAB — CYTOLOGY - PAP
Comment: NEGATIVE
High risk HPV: NEGATIVE

## 2019-08-24 ENCOUNTER — Other Ambulatory Visit: Payer: Self-pay

## 2019-08-24 ENCOUNTER — Ambulatory Visit
Admission: RE | Admit: 2019-08-24 | Discharge: 2019-08-24 | Disposition: A | Discharge status: Home / Self Care | Payer: Medicare Other | Source: Ambulatory Visit | Attending: Internal Medicine | Admitting: Internal Medicine

## 2019-08-24 DIAGNOSIS — Z1231 Encounter for screening mammogram for malignant neoplasm of breast: Secondary | ICD-10-CM

## 2019-08-28 ENCOUNTER — Telehealth: Payer: Self-pay

## 2019-08-28 NOTE — Telephone Encounter (Signed)
Spoke with patient, she has decided to hold off on TKA.

## 2020-04-25 HISTORY — PX: TOTAL HIP ARTHROPLASTY: SHX124

## 2020-07-31 ENCOUNTER — Other Ambulatory Visit: Payer: Self-pay | Admitting: Family Medicine

## 2020-07-31 DIAGNOSIS — Z1231 Encounter for screening mammogram for malignant neoplasm of breast: Secondary | ICD-10-CM

## 2020-09-09 ENCOUNTER — Ambulatory Visit
Admission: RE | Admit: 2020-09-09 | Discharge: 2020-09-09 | Disposition: A | Discharge status: Home / Self Care | Payer: Medicare Other | Source: Ambulatory Visit | Attending: Family Medicine | Admitting: Family Medicine

## 2020-09-09 ENCOUNTER — Other Ambulatory Visit: Payer: Self-pay

## 2020-09-09 ENCOUNTER — Other Ambulatory Visit: Payer: Self-pay | Admitting: Family Medicine

## 2020-09-09 DIAGNOSIS — N63 Unspecified lump in unspecified breast: Secondary | ICD-10-CM

## 2020-09-09 DIAGNOSIS — Z1231 Encounter for screening mammogram for malignant neoplasm of breast: Secondary | ICD-10-CM

## 2020-10-06 ENCOUNTER — Other Ambulatory Visit: Payer: Self-pay

## 2020-10-06 ENCOUNTER — Other Ambulatory Visit: Payer: Self-pay | Admitting: Family Medicine

## 2020-10-06 ENCOUNTER — Ambulatory Visit
Admission: RE | Admit: 2020-10-06 | Discharge: 2020-10-06 | Disposition: A | Discharge status: Home / Self Care | Payer: Medicare Other | Source: Ambulatory Visit | Attending: Family Medicine | Admitting: Family Medicine

## 2020-10-06 ENCOUNTER — Ambulatory Visit: Payer: Medicare Other

## 2020-10-06 DIAGNOSIS — N63 Unspecified lump in unspecified breast: Secondary | ICD-10-CM

## 2020-10-13 ENCOUNTER — Ambulatory Visit
Admission: RE | Admit: 2020-10-13 | Discharge: 2020-10-13 | Disposition: A | Discharge status: Home / Self Care | Payer: Medicare Other | Source: Ambulatory Visit | Attending: Family Medicine | Admitting: Family Medicine

## 2020-10-13 ENCOUNTER — Other Ambulatory Visit: Payer: Self-pay | Admitting: Family Medicine

## 2020-10-13 ENCOUNTER — Other Ambulatory Visit: Payer: Self-pay

## 2020-10-13 DIAGNOSIS — N63 Unspecified lump in unspecified breast: Secondary | ICD-10-CM

## 2020-10-14 ENCOUNTER — Telehealth: Payer: Self-pay | Admitting: Hematology and Oncology

## 2020-10-14 NOTE — Telephone Encounter (Signed)
Received a staff msg from the breast navigators to schedule a new pt appt for Ms. Kelli Abbott. She was last seen in 2017 and has a new dx of lft breast dcis. She has been scheduled to see Dr. Lindi Adie on 3/23 at 4:15pm. I lft the appt date and time on her vm.

## 2020-10-14 NOTE — Progress Notes (Signed)
[place] CONSULT NOTE  Patient Care Team: Wenda Low, MD as PCP - General (Internal Medicine)  CHIEF COMPLAINTS/PURPOSE OF CONSULTATION:  Newly diagnosed left breast DCIS  HISTORY OF PRESENTING ILLNESS:  Kelli Abbott 65 y.o. female is here because of recent diagnosis of DCIS of the left breast. Diagnostic mammogram on 10/06/20 showed a 7.3x7.3cm area of calcifications in the lower-inner left breast. Biopsy on 10/13/20 showed intermediate to high grade DCIS. She has a history of right breast cancer in 2009 treated with lumpectomy, radiation, and 5 years of tamoxifen. She presents to the clinic today for initial evaluation and discussion of treatment options.   I reviewed her records extensively and collaborated the history with the patient.  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, lost to follow-up until 10/03/2012   05/14/2008 Surgery   Right breast lumpectomy: IDC grade 1, 0/3 sentinel nodes, low-grade DCIS present at posterior margin, ER 99%, PR 99%, Ki-67 10%, HER-2 2+ equivocal,CISH negative   05/30/2008 Surgery   Margin reexcision: Benign   07/22/2008 - 09/11/2008 Radiation Therapy   Adjuvant radiation therapy   09/19/2008 - 12/18/2013 Anti-estrogen oral therapy   Tamoxifen 20 mg daily   02/13/2015 Procedure   breast cancer index: 3.4% risk of late recurrence here 5-10, low likelihood of benefit from extended adjuvant therapy 13% versus 9% not significant   11/24/2015 Procedure   Right breast biopsy for 12m calcifications: Hyalinized fibroadenoma with associated coarse calcifications, no atypia or malignancy identified   Ductal carcinoma in situ (DCIS) of left breast  10/13/2020 Initial Diagnosis   Ductal carcinoma in situ (DCIS) of left breast: IG-HG Prog panel pending     MEDICAL HISTORY:   Past Medical History:  Diagnosis Date  . Anemia   . Arthritis    Osteoarthritis  . Breast [facility] [place]   . Breast lump   . Breast nodule    hypoechoic in right breast  . Bursitis of shoulder region   . Cancer (HCC)    breast  . Hypertension   . Mass on back   . Personal history of radiation therapy 2009/2010  . PONV (postoperative nausea and vomiting)   . Skin lesions, generalized     SURGICAL HISTORY: Past Surgical History:  Procedure Laterality Date  . BREAST BIOPSY Right 04/29/2008   malignant  . BREAST BIOPSY Right 11/24/2015   benign  . BREAST LUMPECTOMY Right 06/04/2008  . BREAST SURGERY  2009   R breast reexcision of margins  . HYSTEROSCOPY WITH D & C N/A 11/06/2014   Procedure: DILATATION AND CURETTAGE /HYSTEROSCOPY with resection and myomectomy;  Surgeon: EThurnell Lose MD;  [facility];  Service: Gynecology;  Laterality: N/A;  . MASS EXCISION N/A 01/17/2018   Procedure: EXCISION  OF UPPER BACK MASS AND SKIN TAGS;  Surgeon: BStark Klein MD;  [facility]  Service: General;  Laterality: N/A;  . TOTAL HIP ARTHROPLASTY     right    SOCIAL HISTORY: Social History   Socioeconomic History  . Marital status: Legally Separated    Spouse name: Not on file  . Number of children: Not on file  . Years of education: Not on file  . Highest education level: Not on file  Occupational History  . Not on file  Tobacco Use  . Smoking status: Former Smoker    Packs/day: 1.00  Years: 20.00    Pack years: 20.00    Types: Cigarettes    Quit date: 04/26/2008    Years since quitting: 12.4  . Smokeless tobacco: Never Used  Vaping Use  . Vaping Use: Never used  Substance and Sexual Activity  . Alcohol use: Yes    Comment: less than 1/wk  . Drug use: No  . Sexual activity: Not on file  Other Topics Concern  . Not on file  Social History Narrative  . Not on file   Social Determinants of Health   Financial Resource Strain: Not on file  Food Insecurity:  Not on file  Transportation Needs: Not on file  Physical Activity: Not on file  Stress: Not on file  Social Connections: Not on file  Intimate Partner Violence: Not on file    FAMILY HISTORY: Family History  Problem Relation Age of Onset  . Hypertension Mother   . Hypertension Father   . Hypertension Brother   . Cancer Paternal Aunt   . Breast cancer Paternal Aunt     ALLERGIES:  has No Known Allergies.  MEDICATIONS:  Current Outpatient Medications  Medication Sig Dispense Refill  . Carboxymethylcellul-Glycerin (LUBRICATING EYE DROPS OP) Place 1 drop into both eyes daily as needed (irritation).    . cholecalciferol (VITAMIN D) 1000 units tablet Take 1,000 Units by mouth daily.    . DULoxetine (CYMBALTA) 60 MG capsule Take 60 mg by mouth daily.     . ferrous sulfate 325 (65 FE) MG tablet Take 325 mg by mouth daily with breakfast.    . HYDROcodone-acetaminophen (NORCO) 10-325 MG tablet Take 1-2 tablets by mouth every 6 (six) hours as needed for moderate pain or severe pain. 56 tablet 0  . irbesartan-hydrochlorothiazide (AVALIDE) 300-12.5 MG tablet Take 1 tablet by mouth daily.    Marland Kitchen lidocaine (LMX) 4 % cream Apply 1 application topically as needed (pain).    . meloxicam (MOBIC) 15 MG tablet Take 15 mg by mouth daily.     . phentermine (ADIPEX-P) 37.5 MG tablet Take 37.5 mg by mouth daily before breakfast.    . solifenacin (VESICARE) 10 MG tablet Take 10 mg by mouth daily.     No current facility-administered medications for this visit.    REVIEW OF SYSTEMS:   Constitutional: Denies fevers, chills or abnormal night sweats    Breast:  Denies any palpable lumps or discharge All other systems were reviewed with the patient and are negative.  PHYSICAL EXAMINATION: ECOG PERFORMANCE STATUS: 1 - Symptomatic but completely ambulatory  Vitals:   10/15/20 1603  BP: (!) 134/91  Pulse: 93  Resp: 18  Temp: 98.2 F (36.8 C)  SpO2: 100%   Filed Weights   10/15/20 1603  Weight:  229 lb 6.4 oz (104.1 kg)     LABORATORY DATA:  I have reviewed the data as listed Lab Results  Component Value Date   WBC 11.1 (H) 01/05/2018   HGB 13.5 01/05/2018   HCT 43.4 01/05/2018   MCV 86.6 01/05/2018   PLT 332 01/05/2018   Lab Results  Component Value Date   NA 140 01/05/2018   K 4.0 01/05/2018   CL 105 01/05/2018   CO2 28 01/05/2018    RADIOGRAPHIC STUDIES: I have personally reviewed the radiological reports and agreed with the findings in the report.  ASSESSMENT AND PLAN:  Breast cancer of upper-outer quadrant of right female breast ([place]) 05/14/2008: Right breast lumpectomy: IDC grade 1, 0/3 sentinel nodes, low-grade DCIS present at  posterior margin, ER 99%, PR 99%, Ki-67 10%, HER-2 2+ equivocal,CISH negative Adj XRT Adj Tamoxifen 09/19/2008- 12/18/2013  Ductal carcinoma in situ (DCIS) of left breast lower-inner quadrant of the left breast, there is a broad area of faint amorphous loosely grouped calcifications which on the CC view span approximately 7.3 x 7.3 cm Left breast LIQ: anterior and posterior extents: IG-HG DCIS ER/PR positive  Pathology review: I discussed with the patient the difference between DCIS and invasive breast cancer. It is considered a precancerous lesion. DCIS is classified as a 0. It is generally detected through mammograms as calcifications. We discussed the significance of grades and its impact on prognosis. We also discussed the importance of ER and PR receptors and their implications to adjuvant treatment options. Prognosis of DCIS dependence on grade, comedo necrosis. It is anticipated that if not treated, 20-30% of DCIS can develop into invasive breast cancer.  Recommendation: 1. Left Mastectomy versus bilateral mastectomies (patient prefers bilateral mastectomies without reconstruction) 2. Followed by antiestrogen therapy with anastrozole 5 years (if she does unilateral mastectomy)  Anastrozole counseling: We discussed the risks and  benefits of anti-estrogen therapy with aromatase inhibitors. These include but not limited to insomnia, hot flashes, mood changes, vaginal dryness, bone density loss, and weight gain. We strongly believe that the benefits far outweigh the risks. Patient understands these risks and consented to starting treatment. Planned treatment duration is 5 years.  Return to clinic after surgery to discuss the final pathology report and come up with an adjuvant treatment plan.     All questions were answered. The patient knows to call the clinic with any problems, questions or concerns.   Rulon Eisenmenger, MD, MPH 10/15/2020    I, Molly Dorshimer, am acting as scribe for Nicholas Lose, MD.  I have reviewed the above documentation for accuracy and completeness, and I agree with the above.

## 2020-10-15 ENCOUNTER — Telehealth: Payer: Self-pay | Admitting: Hematology and Oncology

## 2020-10-15 ENCOUNTER — Inpatient Hospital Stay: Payer: Medicare Other | Attending: Hematology and Oncology | Admitting: Hematology and Oncology

## 2020-10-15 ENCOUNTER — Other Ambulatory Visit: Payer: Self-pay

## 2020-10-15 DIAGNOSIS — D0512 Intraductal carcinoma in situ of left breast: Secondary | ICD-10-CM | POA: Diagnosis present

## 2020-10-15 DIAGNOSIS — Z170 Estrogen receptor positive status [ER+]: Secondary | ICD-10-CM | POA: Diagnosis not present

## 2020-10-15 DIAGNOSIS — Z87891 Personal history of nicotine dependence: Secondary | ICD-10-CM | POA: Diagnosis not present

## 2020-10-15 DIAGNOSIS — Z923 Personal history of irradiation: Secondary | ICD-10-CM | POA: Diagnosis not present

## 2020-10-15 DIAGNOSIS — C50411 Malignant neoplasm of upper-outer quadrant of right female breast: Secondary | ICD-10-CM

## 2020-10-15 NOTE — Telephone Encounter (Signed)
I cld Kelli Abbott to see if she could come in today to see Dr. Lindi Adie. I lft the pt a vm to call me back.

## 2020-10-15 NOTE — Assessment & Plan Note (Signed)
05/14/2008: Right breast lumpectomy: IDC grade 1, 0/3 sentinel nodes, low-grade DCIS present at posterior margin, ER 99%, PR 99%, Ki-67 10%, HER-2 2+ equivocal,CISH negative Adj XRT Adj Tamoxifen 09/19/2008- 12/18/2013

## 2020-10-15 NOTE — Assessment & Plan Note (Addendum)
lower-inner quadrant of the left breast, there is a broad area of faint amorphous loosely grouped calcifications which on the CC view span approximately 7.3 x 7.3 cm Left breast LIQ: anterior and posterior extents: IG-HG DCIS  Pathology review: I discussed with the patient the difference between DCIS and invasive breast cancer. It is considered a precancerous lesion. DCIS is classified as a 0. It is generally detected through mammograms as calcifications. We discussed the significance of grades and its impact on prognosis. We also discussed the importance of ER and PR receptors and their implications to adjuvant treatment options. Prognosis of DCIS dependence on grade, comedo necrosis. It is anticipated that if not treated, 20-30% of DCIS can develop into invasive breast cancer.  Recommendation: 1. Left Mastectomy 2. Followed by antiestrogen therapy with tamoxifen 5 years (if DCIS is ER and PR positive)  Tamoxifen counseling: We discussed the risks and benefits of tamoxifen. These include but not limited to insomnia, hot flashes, mood changes, vaginal dryness, and weight gain. Although rare, serious side effects including endometrial cancer, risk of blood clots were also discussed. We strongly believe that the benefits far outweigh the risks. Patient understands these risks and consented to starting treatment. Planned treatment duration is 5 years.  Return to clinic after surgery to discuss the final pathology report and come up with an adjuvant treatment plan.

## 2020-10-16 ENCOUNTER — Telehealth: Payer: Self-pay | Admitting: Hematology and Oncology

## 2020-10-16 ENCOUNTER — Encounter: Payer: Self-pay | Admitting: *Deleted

## 2020-10-16 MED ORDER — PALONOSETRON HCL INJECTION 0.25 MG/5ML
INTRAVENOUS | Status: AC
  Filled 2020-10-16: qty 5

## 2020-10-16 NOTE — Telephone Encounter (Signed)
Scheduled per 3/23 los. Called and spoke with pt confirmed 4/14 appts

## 2020-10-21 ENCOUNTER — Telehealth: Payer: Self-pay | Admitting: *Deleted

## 2020-10-21 ENCOUNTER — Encounter: Payer: Self-pay | Admitting: *Deleted

## 2020-10-21 MED ORDER — ANASTROZOLE 1 MG PO TABS: 1.0000 mg | ORAL_TABLET | Freq: Every day | ORAL | 6 refills | Status: DC

## 2020-10-21 NOTE — Telephone Encounter (Signed)
Spoke to pt regarding appt with [facility] to discuss sx options. Pt would like to be seen at [facility] for sx as she has had sx with them and had a great experience. Informed pt we will make that referral and they will call her with an appt.  Since there is a delay in sx, prescription sent in for anastrozole as pt would like to start tx while waiting on sx. Verified pharmacy.  No further needs voiced at this time. Provided pt with navigation resources and contact information for further needs or questions.

## 2020-10-22 ENCOUNTER — Other Ambulatory Visit: Payer: Self-pay | Admitting: *Deleted

## 2020-10-22 DIAGNOSIS — C50411 Malignant neoplasm of upper-outer quadrant of right female breast: Secondary | ICD-10-CM

## 2020-10-22 DIAGNOSIS — Z170 Estrogen receptor positive status [ER+]: Secondary | ICD-10-CM

## 2020-10-22 NOTE — Progress Notes (Signed)
Referral placed for pt to see Dr. Nino Glow. Per pt request would like to have sx at [facility] Physician team notified

## 2020-10-23 ENCOUNTER — Encounter: Payer: Self-pay | Admitting: *Deleted

## 2020-11-06 ENCOUNTER — Other Ambulatory Visit: Payer: Self-pay

## 2020-11-06 ENCOUNTER — Inpatient Hospital Stay: Payer: Medicare Other | Attending: Hematology and Oncology | Admitting: Genetic Counselor

## 2020-11-06 ENCOUNTER — Inpatient Hospital Stay: Payer: Medicare Other

## 2020-11-06 ENCOUNTER — Encounter: Payer: Self-pay | Admitting: *Deleted

## 2020-11-06 ENCOUNTER — Encounter: Payer: Self-pay | Admitting: Genetic Counselor

## 2020-11-06 ENCOUNTER — Other Ambulatory Visit: Payer: Self-pay | Admitting: Genetic Counselor

## 2020-11-06 DIAGNOSIS — Z170 Estrogen receptor positive status [ER+]: Secondary | ICD-10-CM

## 2020-11-06 DIAGNOSIS — Z800 Family history of malignant neoplasm of digestive organs: Secondary | ICD-10-CM | POA: Diagnosis not present

## 2020-11-06 DIAGNOSIS — D0512 Intraductal carcinoma in situ of left breast: Secondary | ICD-10-CM

## 2020-11-06 DIAGNOSIS — C50411 Malignant neoplasm of upper-outer quadrant of right female breast: Secondary | ICD-10-CM | POA: Diagnosis not present

## 2020-11-06 DIAGNOSIS — Z803 Family history of malignant neoplasm of breast: Secondary | ICD-10-CM | POA: Diagnosis not present

## 2020-11-06 HISTORY — DX: Family history of malignant neoplasm of digestive organs: Z80.0

## 2020-11-06 HISTORY — DX: Family history of malignant neoplasm of breast: Z80.3

## 2020-11-07 ENCOUNTER — Encounter: Payer: Self-pay | Admitting: Genetic Counselor

## 2020-11-07 DIAGNOSIS — Z1379 Encounter for other screening for genetic and chromosomal anomalies: Secondary | ICD-10-CM | POA: Insufficient documentation

## 2020-11-07 NOTE — Progress Notes (Signed)
REFERRING PROVIDER: Nicholas Lose, MD [address] [place],  [place] [id number]  PRIMARY PROVIDER:  Wenda Low, MD  PRIMARY REASON FOR VISIT:  1. Ductal carcinoma in situ (DCIS) of left breast   2. Malignant neoplasm of upper-outer quadrant of right breast in female, estrogen receptor positive ([place])   3. Family history of breast cancer   4. Family history of pancreatic cancer     HISTORY OF PRESENT ILLNESS:   Ms. Sukhu, a 65 y.o. female, was seen for a [place] cancer genetics consultation at the request of Dr. Lindi Adie due to a personal and family history of cancer.  Ms. Lichtman presents to clinic today to discuss the possibility of a hereditary predisposition to cancer, to discuss genetic testing, and to further clarify her future cancer risks, as well as potential cancer risks for family members.   In 2009, at the age of 35, Ms. Goodwill was diagnosed with invasive carcinoma of the right breast (ER+/PR+/HER2-). The treatment plan included lumpectomy, adjuvant radiation, and tamoxifen.  In 2022, at the age of 14, Ms. Fenley was diagnosed with ductal carcinoma in situ of the left breast (ER+/PR+).  The preliminary treatment plan includes mastectomy and tamoxifen.    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, lost to follow-up until 10/03/2012   05/14/2008 Surgery   Right breast lumpectomy: IDC grade 1, 0/3 sentinel nodes, low-grade DCIS present at posterior margin, ER 99%, PR 99%, Ki-67 10%, HER-2 2+ equivocal,CISH negative   05/30/2008 Surgery   Margin reexcision: Benign   07/22/2008 - 09/11/2008 Radiation Therapy   Adjuvant radiation therapy   09/19/2008 - 12/18/2013 Anti-estrogen oral therapy   Tamoxifen 20 mg daily   02/13/2015 Procedure   breast cancer index: 3.4% risk of late recurrence here 5-10, low likelihood of benefit  from extended adjuvant therapy 13% versus 9% not significant   11/24/2015 Procedure   Right breast biopsy for 21m calcifications: Hyalinized fibroadenoma with associated coarse calcifications, no atypia or malignancy identified   Ductal carcinoma in situ (DCIS) of left breast  10/13/2020 Initial Diagnosis   Ductal carcinoma in situ (DCIS) of left breast: IG-HG Prog panel pending   10/13/2020 Cancer Staging   Staging form: Breast, AJCC 8th Edition - Clinical stage from 10/13/2020: Stage 0 (cTis (DCIS), cN0, cM0, ER+, PR+) - Signed by CGardenia Phlegm NP on 10/22/2020 Stage prefix: Initial diagnosis     RISK FACTORS:  Menarche was at age [age]  First live birth at age [age]  OCP use for approximately 0 years.  Ovaries intact: yes.  Hysterectomy: no.  Menopausal status: postmenopausal; periods stopped at age [age]HRT use: unsure; possible use for approx 1 year Colonoscopy: yes; most recent approx 11 years ago. Mammogram within the last year: yes. Number of breast biopsies: 2. Up to date with pelvic exams: yes.  Past Medical History:  Diagnosis Date  . Anemia   . Arthritis    Osteoarthritis  . Breast cancer [place]   . Breast lump   . Breast nodule    hypoechoic in right breast  . Bursitis of shoulder region   . Cancer ([facility]    breast  . Family history of breast cancer 11/06/2020  . Family history of pancreatic cancer 11/06/2020  . Hypertension   . Mass on back   . Personal history of radiation therapy 2009/2010  . PONV (postoperative  nausea and vomiting)   . Skin lesions, generalized     Past Surgical History:  Procedure Laterality Date  . BREAST BIOPSY Right 04/29/2008   malignant  . BREAST BIOPSY Right 11/24/2015   benign  . BREAST LUMPECTOMY Right 06/04/2008  . BREAST SURGERY  2009   R breast reexcision of margins  . HYSTEROSCOPY WITH D & C N/A 11/06/2014   Procedure: DILATATION AND CURETTAGE /HYSTEROSCOPY with resection and myomectomy;  Surgeon: Thurnell Lose,  MD;  Location: [facility];  Service: Gynecology;  Laterality: N/A;  . MASS EXCISION N/A 01/17/2018   Procedure: EXCISION  OF UPPER BACK MASS AND SKIN TAGS;  Surgeon: Stark Klein, MD;  Location: [facility];  Service: General;  Laterality: N/A;  . TOTAL HIP ARTHROPLASTY     right    Social History   Socioeconomic History  . Marital status: Legally Separated    Spouse name: Not on file  . Number of children: Not on file  . Years of education: Not on file  . Highest education level: Not on file  Occupational History  . Not on file  Tobacco Use  . Smoking status: Former Smoker    Packs/day: 1.00    Years: 20.00    Pack years: 20.00    Types: Cigarettes    Quit date: 04/26/2008    Years since quitting: 12.5  . Smokeless tobacco: Never Used  Vaping Use  . Vaping Use: Never used  Substance and Sexual Activity  . Alcohol use: Yes    Comment: less than 1/wk  . Drug use: No  . Sexual activity: Not on file  Other Topics Concern  . Not on file  Social History Narrative  . Not on file   Social Determinants of Health   Financial Resource Strain: Not on file  Food Insecurity: Not on file  Transportation Needs: Not on file  Physical Activity: Not on file  Stress: Not on file  Social Connections: Not on file     FAMILY HISTORY:  We obtained a detailed, 4-generation family history.  Significant diagnoses are listed below: Family History  Problem Relation Age of Onset  . Breast cancer Paternal Aunt        dx before 46  . Pancreatic cancer Paternal Grandfather        dx 22s    Ms. Brockwell has one daughter, age 68, and two grandsons, age 96 and 21.  She stated her daughter may have had genetic testing previously.  No report was available for review today.   Ms. Kassab has two brothers in their 11s, both without a cancer history.  Ms. Vasseur mother died at age 67 and did not have cancer.  Ms. Allender father died at age 82 and did not have cancer.  Ms. Schewe paternal grandfather had  pancreatic cancer in his 14s.  Ms. Joshi father's maternal half sister was diagnosed with breast cancer and died before age 10.   Ms. Derringer is unaware of previous family history of genetic testing for hereditary cancer risks besides that mentioned above. Patient's maternal ancestors are of Black/African American descent, and paternal ancestors are of Black/African American descent. There is no reported Ashkenazi Jewish ancestry. Ms. Charlet stated possible consanguinity between her parents but did not know the specific relation.   GENETIC COUNSELING ASSESSMENT: Ms. Lessig is a 65 y.o. female with a personal and family history of cancer which is somewhat suggestive of a hereditary cancer syndrome and predisposition to cancer given the age of  her first breast cancer diagnosis and the presence of related cancers in her paternal family. We, therefore, discussed and recommended the following at today's visit.   DISCUSSION: We discussed that 5 - 10% of cancer is hereditary, with most cases of hereditary breast cancer associated with mutations in BRCA1/2.  There are other genes that can be associated with hereditary breast and pancreatic cancer syndromes.  Type of cancer risk and level of risk are gene-specific.  We discussed that testing is beneficial for several reasons including knowing how to follow individuals after completing their treatment, identifying whether potential treatment/surigical options would be beneficial, and understanding if other family members could be at risk for cancer and allowing them to undergo genetic testing.   We reviewed the characteristics, features and inheritance patterns of hereditary cancer syndromes. We also discussed genetic testing, including the appropriate family members to test, the process of testing, insurance coverage and turn-around-time for results. We discussed the implications of a negative, positive, carrier and/or variant of uncertain significant result. We  recommended Ms. Dibbern pursue genetic testing for a panel that includes genes associated with breast and pancreatic cancer.   Based on Ms. Miyoshi personal history of breast cancer, she meets medical criteria for genetic testing. Despite that she meets criteria, she may still have an out of pocket cost.   PLAN:  Ms. Friedlander did not wish to pursue genetic testing at today's visit. She expressed interest in genetic testing but wishes to have testing after her surgery for the purpose of providing information to her daughter and grandsons. We understand this decision and remain available to coordinate genetic testing at any time in the future.  Ms. Routzahn was provided with my contact information so that she can contact me directly when she ready to have genetic testing.  She also requested a call to further discuss genetic testing in approximately two months. At this time, we recommend Ms. Butner continue to follow the cancer screening guidelines given by her oncology and primary healthcare providers.  Lastly, we encouraged Ms. Hendley to remain in contact with cancer genetics annually so that we can continuously update the family history and inform her of any changes in cancer genetics and testing that may be of benefit for this family.   Ms. Majer questions were answered to her satisfaction today. Our contact information was provided should additional questions or concerns arise. Thank you for the referral and allowing [place] to share in the care of your patient.   Ronin Crager M. Joette Catching, [place], [facility] Genetic Counselor [email or web address] (P) [phone]  The patient was seen for a total of 40 minutes in face-to-face genetic counseling.  Drs. Magrinat, Lindi Adie and/or Burr Medico were available to discuss this case as needed.    _______________________________________________________________________ For Office Staff:  Number of people involved in session: 1 Was an Intern/ student involved with case: no

## 2020-11-17 ENCOUNTER — Telehealth: Payer: Self-pay | Admitting: *Deleted

## 2020-11-17 ENCOUNTER — Encounter: Payer: Self-pay | Admitting: *Deleted

## 2020-11-17 NOTE — Telephone Encounter (Signed)
Left vm regarding sx plan with [facility]. Request return call to further discuss. Contact information provided.

## 2020-12-05 ENCOUNTER — Encounter: Payer: Self-pay | Admitting: *Deleted

## 2020-12-09 ENCOUNTER — Telehealth: Payer: Self-pay | Admitting: Hematology and Oncology

## 2020-12-09 NOTE — Telephone Encounter (Signed)
Scheduled appt per 5/13 sch msg. Called pt, no answer. Left msg with appt date and time.  

## 2020-12-11 ENCOUNTER — Encounter: Payer: Self-pay | Admitting: *Deleted

## 2020-12-23 ENCOUNTER — Telehealth: Payer: Self-pay | Admitting: Hematology and Oncology

## 2020-12-23 NOTE — Telephone Encounter (Signed)
Cancelled appt per 5/31 sch msg. Called pt, no answer. Left msg for pt to call me back if interested in r/s.

## 2020-12-25 ENCOUNTER — Ambulatory Visit: Payer: Medicare Other | Admitting: Hematology and Oncology

## 2020-12-25 ENCOUNTER — Telehealth: Payer: Self-pay | Admitting: *Deleted

## 2020-12-25 ENCOUNTER — Encounter: Payer: Self-pay | Admitting: *Deleted

## 2020-12-25 NOTE — Telephone Encounter (Signed)
Called pt to check in. Was unable to leave vm. Will call again. Called [facility] to speak to navigator. Pt currently taking some time to process tx and deciding on what type of sx she would like to pursue.

## 2020-12-31 ENCOUNTER — Other Ambulatory Visit: Payer: Self-pay | Admitting: General Surgery

## 2020-12-31 DIAGNOSIS — D0512 Intraductal carcinoma in situ of left breast: Secondary | ICD-10-CM

## 2021-01-06 ENCOUNTER — Ambulatory Visit (INDEPENDENT_AMBULATORY_CARE_PROVIDER_SITE_OTHER): Payer: Medicare Other | Admitting: Plastic Surgery

## 2021-01-06 ENCOUNTER — Encounter: Payer: Self-pay | Admitting: Plastic Surgery

## 2021-01-06 ENCOUNTER — Other Ambulatory Visit: Payer: Self-pay

## 2021-01-06 VITALS — BP 143/91 | HR 86 | Ht 63.0 in | Wt 232.6 lb

## 2021-01-06 DIAGNOSIS — D0512 Intraductal carcinoma in situ of left breast: Secondary | ICD-10-CM | POA: Diagnosis not present

## 2021-01-06 DIAGNOSIS — Z1379 Encounter for other screening for genetic and chromosomal anomalies: Secondary | ICD-10-CM | POA: Diagnosis not present

## 2021-01-06 NOTE — Progress Notes (Signed)
Patient ID: Kelli Abbott, female    DOB: 23-Aug-1955, 65 y.o.   MRN: [id number]   Chief Complaint  Patient presents with   Advice Only    The patient is a 65 year old female for consultation for breast reconstruction.  The patient was diagnosed with left breast cancer.  She had right breast cancer in 2009.  At that time she underwent a partial mastectomy followed by radiation.  She took tamoxifen for 5 years.  Then when she went for her mammogram in April cancer was found.  The left breast biopsy was positive.  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  She is 5 feet 3 inches tall and weighs 232 pounds.  Her BMI is 41.1 kg/m.  Her sternal notch to right nipple is 33 cm and sternal notch to left nipple is 39 cm.  Her preoperative bra size is a 46 DD on the left.  She would like to be a C cup.  She is currently taking Arimidex.  Her general surgeon is Dr. Bary Castilla.  She saw another surgeon as well but had a second opinion with Dr. Brantley Stage who she is going to stick with.  He does not have diabetes and stop smoking several years ago.  She does have a positive family history for breast cancer.  She has multiple medical conditions as listed below.  She also had an attempt at excision of a lipoma on her back.  She also has a sebaceous cyst on the right back area.  She had a recent surgery on her left hip.  Her main concern is to get back into water aerobics.   Review of Systems  Constitutional:  Negative for activity change and appetite change.  Eyes: Negative.   Respiratory: Negative.    Cardiovascular:  Positive for leg swelling.  Gastrointestinal:  Negative for abdominal distention.  Endocrine: Negative.   Genitourinary: Negative.   Musculoskeletal:  Positive for joint swelling.  Skin:  Negative for color change.  Hematological: Negative.   Psychiatric/Behavioral: Negative.     Past Medical History:  Diagnosis Date   Anemia     Arthritis    Osteoarthritis   Breast cancer ([place])    Breast lump    Breast nodule    hypoechoic in right breast   Bursitis of shoulder region    Cancer [facility])    breast   Family history of breast cancer 11/06/2020   Family history of pancreatic cancer 11/06/2020   Hypertension    Mass on back    Personal history of radiation therapy 2009/2010   PONV (postoperative nausea and vomiting)    Skin lesions, generalized     Past Surgical History:  Procedure Laterality Date   BREAST BIOPSY Right 04/29/2008   malignant   BREAST BIOPSY Right 11/24/2015   benign   BREAST LUMPECTOMY Right 06/04/2008   BREAST SURGERY  2009   R breast reexcision of margins   HYSTEROSCOPY WITH D & C N/A 11/06/2014   Procedure: DILATATION AND CURETTAGE /HYSTEROSCOPY with resection and myomectomy;  Surgeon: Thurnell Lose, MD;  Location: [facility];  Service: Gynecology;  Laterality: N/A;   MASS EXCISION N/A 01/17/2018   Procedure: EXCISION  OF UPPER BACK MASS AND SKIN TAGS;  Surgeon: Stark Klein, MD;  Location: [place];  Service: General;  Laterality: N/A;   TOTAL HIP ARTHROPLASTY     right      Current Outpatient Medications:  anastrozole (ARIMIDEX) 1 MG tablet, Take 1 tablet (1 mg total) by mouth daily., Disp: 30 tablet, Rfl: 6   Carboxymethylcellul-Glycerin (LUBRICATING EYE DROPS OP), Place 1 drop into both eyes daily as needed (irritation)., Disp: , Rfl:    cholecalciferol (VITAMIN D) 1000 units tablet, Take 1,000 Units by mouth daily., Disp: , Rfl:    DULoxetine (CYMBALTA) 60 MG capsule, Take 60 mg by mouth daily. , Disp: , Rfl:    ferrous sulfate 325 (65 FE) MG tablet, Take 325 mg by mouth daily with breakfast., Disp: , Rfl:    HYDROcodone-acetaminophen (NORCO) 10-325 MG tablet, Take 1-2 tablets by mouth every 6 (six) hours as needed for moderate pain or severe pain., Disp: 56 tablet, Rfl: 0   irbesartan-hydrochlorothiazide (AVALIDE) 300-12.5 MG tablet, Take 1 tablet by mouth daily., Disp: , Rfl:     lidocaine (LMX) 4 % cream, Apply 1 application topically as needed (pain)., Disp: , Rfl:    meloxicam (MOBIC) 15 MG tablet, Take 15 mg by mouth daily. , Disp: , Rfl:    phentermine (ADIPEX-P) 37.5 MG tablet, Take 37.5 mg by mouth daily before breakfast., Disp: , Rfl:    solifenacin (VESICARE) 10 MG tablet, Take 10 mg by mouth daily., Disp: , Rfl:    Objective:   Vitals:   01/06/21 1225  BP: (!) 143/91  Pulse: 86  SpO2: 99%    Physical Exam Vitals and nursing note reviewed.  Constitutional:      Appearance: Normal appearance.  HENT:     Head: Normocephalic.  Eyes:     Pupils: Pupils are equal, round, and reactive to light.  Cardiovascular:     Rate and Rhythm: Normal rate.     Pulses: Normal pulses.  Pulmonary:     Effort: Pulmonary effort is normal. No respiratory distress.  Abdominal:     General: Abdomen is flat. There is no distension.  Musculoskeletal:        General: Swelling and tenderness present. No deformity.  Skin:    General: Skin is warm.     Capillary Refill: Capillary refill takes less than 2 seconds.     Coloration: Skin is not jaundiced.  Neurological:     General: No focal deficit present.     Mental Status: She is alert and oriented to person, place, and time.  Psychiatric:        Mood and Affect: Mood normal.        Behavior: Behavior normal.    Assessment & Plan:  Ductal carcinoma in situ (DCIS) of left breast  Genetic testing  The options for reconstruction we explained to the patient / family for breast reconstruction.  There are two general categories of reconstruction.  We can reconstruction a breast with implants or use the patient's own tissue.  These were further discussed as listed.  Breast reconstruction is an optional procedure and eligibility depends on the full spectrum of the health of the patient and any co-morbidities.  More than one surgery is often needed to complete the reconstruction process.  The process can take three to  twelve months to complete.  The breasts will not be identical due to many factors such as rib differences, shoulder asymmetry and treatments such as radiation.  The goal is to get the breasts to look normal and symmetrical in clothes.  Scars are a part of surgery and may fade some in time but will always be present under clothes.  Surgery may be an option on the non-cancer breast  to achieve more symmetry.  No matter which procedure is chosen there is always the risk of complications and even failure of the body to heal.  This could result in no breast.    The options for reconstruction include:  1. Placement of a tissue expander with Acellular dermal matrix. When the expander is the desired size surgery is performed to remove the expander and place an implant.  In some cases the implant can be placed without an expander.  2. Autologous reconstruction can include using a muscle or tissue from another area of the body to create a breast.  3. Combined procedures (ie. latissismus dorsi flap) can be done with an expander / implant placed under the muscle.   The risks, benefits, scars and recovery time were discussed for each of the above. Risks include bleeding, infection, hematoma, seroma, scarring, pain, wound healing complications, flap loss, fat necrosis, capsular contracture, need for implant removal, donor site complications, bulge, hernia, umbilical necrosis, need for urgent reoperation, and need for dressing changes.   The procedure the patient selected / that was best for the patient, was then discussed in further detail.  Total time: 45 minutes. This includes time spent with the patient during the visit as well as time spent before and after the visit reviewing the chart, documenting the encounter, making phone calls and reviewing studies.   After careful consideration, the patient is interested in a left oncoplastic reduction.  Originally she wanted to do both sides.  However with her history of  radiation on the right side it would be safest to leave that side alone.  I think this is likely the best decision.  Plan for oncoplastic left breast reduction with Dr. Bary Castilla.  Pictures were obtained of the patient and placed in the chart with the patient's or guardian's permission.   [place], DO

## 2021-01-08 ENCOUNTER — Other Ambulatory Visit: Payer: Self-pay | Admitting: General Surgery

## 2021-01-08 ENCOUNTER — Encounter: Payer: Self-pay | Admitting: *Deleted

## 2021-01-08 DIAGNOSIS — D0512 Intraductal carcinoma in situ of left breast: Secondary | ICD-10-CM

## 2021-01-13 ENCOUNTER — Encounter: Payer: Self-pay | Admitting: *Deleted

## 2021-01-13 ENCOUNTER — Other Ambulatory Visit: Payer: Self-pay | Admitting: General Surgery

## 2021-01-13 DIAGNOSIS — D0512 Intraductal carcinoma in situ of left breast: Secondary | ICD-10-CM

## 2021-01-14 ENCOUNTER — Telehealth: Payer: Self-pay | Admitting: Hematology and Oncology

## 2021-01-14 NOTE — Telephone Encounter (Signed)
Scheduled appt per 6/21 sch msg. Called pt, no answer. Left msg with appt date and time.

## 2021-01-15 ENCOUNTER — Other Ambulatory Visit: Payer: Self-pay | Admitting: General Surgery

## 2021-01-15 NOTE — Progress Notes (Signed)
Subjective:     Patient ID: Kelli Abbott is a 65 y.o. female.   HPI   The following portions of the patient's history were reviewed and updated as appropriate.   This a new patient is here today for: office visit. The patient is here today for evaluation of left breast cancer. She had a left stereotactic biopsy completed on 10-13-20. She has been seen by Dr. Nicholas Lose at the [facility]. The patient is currently on Arimidex. Patient states she was seen by a surgeon at [facility] but did not feel that's where she needed to be treated. Patient did have a paternal aunt with a history of breast cancer but she is unsure of her age of diagnosis.    Patient reports her bra size is a 34D.    The patient does have a history of right breast cancer in 2009 followed by radiation through [facility].    Patient does report she thinks she is due for a colonoscopy. She says the last one was completed through [place] around 2009 or 2010. The patient also has a lipoma on her back that she would like checked.   Patient has a friend who is in her swim club that recommended that she be evaluated here.          Chief Complaint  Patient presents with   Treatment Plan Discussion      Left breast cancer      BP 132/84   Pulse 81   Temp 36.6 C (97.9 F)   Ht 160 cm ('5\' 3"' )   Wt (!) 104.3 kg (230 lb)   SpO2 100%   BMI 40.74 kg/m        Past Medical History:  Diagnosis Date   Anemia     Arthritis     Breast cancer (CMS-H[facility])     Bursitis of shoulder region     Hypertension     Personal history of radiation therapy     PONV (postoperative nausea and vomiting)     Skin lesions, generalized             Past Surgical History:  Procedure Laterality Date   ARTHROPLASTY HIP TOTAL Right 04/2020   BREAST EXCISIONAL BIOPSY Right 11/24/2015   BREAST EXCISIONAL BIOPSY Right 04/29/2008   breast re-excision of margins Right 2009   COLONOSCOPY       excision mass upper back and skin tags   01/17/2018    hysteroscopy with d and c   11/06/2014   MASTECTOMY PARTIAL / LUMPECTOMY Right 06/04/2008                OB History     Gravida  1   Para  1   Term      Preterm      AB      Living         SAB      IAB      Ectopic      Molar      Multiple      Live Births           Obstetric Comments  Age at first period 13 Age of first pregnancy 80             Social History           Socioeconomic History   Marital status: Widowed  Tobacco Use   Smoking status: Former Smoker      Packs/day: 1.00  Years: 20.00      Pack years: 20.00      Types: Cigarettes      Quit date: 04/26/2008      Years since quitting: 12.6   Smokeless tobacco: Never Used  Substance and Sexual Activity   Alcohol use: Not Currently   Drug use: Never        No Known Allergies   Current Medications        Current Outpatient Medications  Medication Sig Dispense Refill   acetaminophen (TYLENOL) 650 MG ER tablet Take 650 mg by mouth every 8 (eight) hours as needed       anastrozole (ARIMIDEX) 1 mg tablet anastrozole 1 mg tablet  Take 1 tablet (1 mg total) by mouth daily.       cholecalciferol (VITAMIN D3) 1000 unit tablet Take by mouth       ferrous sulfate 325 (65 FE) MG tablet Take by mouth       HYDROcodone-acetaminophen (NORCO) 10-325 mg tablet hydrocodone 10 mg-acetaminophen 325 mg tablet  1 (one) Tablet by mouth four times daily, as needed       ibuprofen (MOTRIN) 800 MG tablet ibuprofen 800 mg tablet  Take 1 tablet by mouth daily       irbesartan-hydrochlorothiazide (AVALIDE) 300-12.5 mg tablet irbesartan 300 mg-hydrochlorothiazide 12.5 mg tablet  1 tablet Once a day Orally 90       semaglutide (OZEMPIC) 0.25 mg or 0.5 mg(2 mg/1.5 mL) pen injector Ozempic 0.25 mg or 0.5 mg (2 mg/1.5 mL) subcutaneous pen injector  1 (one) Solution Pen-injector Solution Pen-injector Give 0.42m under the skin once weekly       solifenacin (VESICARE) 10 MG tablet solifenacin 10 mg tablet   1 tablet Orally Once a day 90 days        No current facility-administered medications for this visit.             Family History  Problem Relation Age of Onset   High blood pressure (Hypertension) Mother     High blood pressure (Hypertension) Father     High blood pressure (Hypertension) Brother     Breast cancer Paternal Aunt     Pancreatic cancer Paternal Grandfather     Colon cancer Cousin          Review of Systems  Constitutional: Negative for chills and fever.  Respiratory: Negative for cough.          Objective:   Physical Exam Exam conducted with a chaperone present.  Constitutional:      Appearance: Normal appearance.  Cardiovascular:     Rate and Rhythm: Normal rate and regular rhythm.     Pulses: Normal pulses.     Heart sounds: Normal heart sounds.  Pulmonary:     Effort: Pulmonary effort is normal.     Breath sounds: Normal breath sounds.  Chest:  Breasts:     Right: Skin change present. No axillary adenopathy or supraclavicular adenopathy.     Left: Normal. No axillary adenopathy or supraclavicular adenopathy.         Comments: Marked breast asymmetry post radiation left 1.5-2 cup sizes larger than the right.  Mild scarring at the site of the 2009 wide excision. Musculoskeletal:     Cervical back: Neck supple.       Legs:     Comments: Area of numbness on the anterior lateral aspect of the left proximal thigh post anterior approach for hip replacement.  No motor strength weakness noted.  Normal  gait.  Lymphadenopathy:     Upper Body:     Right upper body: No supraclavicular or axillary adenopathy.     Left upper body: No supraclavicular or axillary adenopathy.  Skin:    General: Skin is warm and dry.  Neurological:     Mental Status: She is alert and oriented to person, place, and time.  Psychiatric:        Mood and Affect: Mood normal.        Behavior: Behavior normal.      Labs and Radiology:    Mammograms from August 11, 2018  through October 13, 2020 were independently reviewed.   Initial imaging suggested a 7 cm area of amorphous loosely grouped calcifications seen only on the cc view.  Patient subsequently underwent stereotactic biopsy of the anterior and posterior aspects with findings of intermediate-high-grade DCIS.  Recommendation for MRI noted.   The radiologist identification of these microcalcifications was magnificent.   Genetic pedigree:   Single great aunt with history of breast cancer age 44.   Pathology of October 13, 2020: Diagnosis 1. Breast, left, needle core biopsy, lower inner quadrant, posterior extent - DUCTAL CARCINOMA IN SITU, INTERMEDIATE TO HIGH-GRADE WITH CALCIFICATIONS. SEE NOTE 2. Breast, left, needle core biopsy, lower inner quadrant, anterior extent - DUCTAL CARCINOMA IN SITU, INTERMEDIATE TO HIGH-GRADE WITH CALCIFICATIONS. SEE NOTE 1 of 3 FINAL for KEMYRA, AUGUST ([id number]) Diagnosis Note 1. and 2. DCIS measures 0.5 cm in both parts. Dr. Tresa Moore reviewed the case and concurs with the diagnosis. A breast prognostic profile (ER, PR) is pending and will be reported in an addendum. [facility] was notified on 10/14/2020.   IMMUNOHISTOCHEMICAL AND MORPHOMETRIC ANALYSIS PERFORMED MANUALLY Estrogen Receptor: 95%, POSITIVE, STRONG STAINING INTENSITY Progesterone Receptor: 95%, POSITIVE, STRONG STAINING INTENSITY REFERENCE RANGE ESTROGEN RECEPTOR NEGATIVE 0% POSITIVE =>1% REFERENCE RANGE PROGESTERONE RECEPTOR NEGATIVE 0% POSITIVE =>1% All controls stained appropriately      Assessment:     Multifocal DCIS encompassing a 7 cm area on imaging.   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   Breast asymmetry status post right treatment with wide excision and whole breast radiation.  1.5-2 cup size.    Plan:     I think her numbness in the left proximal thigh will  likely be ongoing, as it did not improve significantly over the last 7 months.  She has no associated motor weakness, and I do not see a contraindication for her return to work.  She was encouraged to call her orthopedist and arrange for an assessment.  She reports that she wants to work, she needs to work and would anxious lady be seeking a job if permitted. In light of her breast asymmetry, I think it be prudent to have her evaluated by plastic surgery for a combined procedure to bring her breasts into the "same ZIP Code".   The patient will require wire localization prior to surgery.  This procedure was reviewed with the patient.   Patient to be referred to Dr. Audelia Hives.    More than 50% of the one-hour patient exam related to options for management and record review.    This note is partially prepared by Ledell Noss, CMA acting as a scribe in the presence of Dr. Hervey Ard, MD.    The documentation recorded by the scribe accurately reflects the service I personally performed and the decisions made by me.  Robert Bellow, MD FACS  The patient has met with plastic surgery and is felt to be a candidate for breast reduction surgery.  This will be a combined procedure.  Based on present location of disease we will defer sentinel node biopsy pending formal resection as the surgical fields will not disrupt lymphatic drainage nor preclude lymph node biopsy in the future.

## 2021-02-05 NOTE — Progress Notes (Signed)
Patient ID: Kelli Abbott, female    DOB: 1956/07/26, 65 y.o.   MRN: [id number]  Chief Complaint  Patient presents with   Pre-op Exam       ICD-10-CM   1. Ductal carcinoma in situ (DCIS) of left breast  D05.12       History of Present Illness: Kelli Abbott is a 65 y.o.  female  with a history of left breast cancer.  She presents for preoperative evaluation for upcoming procedure, left breast oncoplastic breast reduction, scheduled for 02/18/2021 with Dr.  Marla Roe  The patient has had problems with anesthesia.  Reports history of postoperative nausea vomiting.  No other complications with anesthesia.   No history of DVT/PE.  No family history of DVT/PE.  No family or personal history of bleeding or clotting disorders.  Patient is not currently taking any blood thinners.  No history of CVA/MI.   Summary of Previous Visit: Patient diagnosed with left breast cancer.  She had right breast cancer in 2009 and underwent a partial mastectomy followed by radiation.  Left breast biopsy was positive for ER/PR.  DCIS with intermediate grade lower inner quadrant.  She would like to be a C cup.  She is currently taking Arimidex.  She quit smoking several years ago.  PMH Significant for: Postoperative nausea vomiting, chronic hip pain, arthritis, anemia, hypertension.  Patient is currently on Norco 10 for chronic hip pain.  She reports that she has been feeling well lately.  Reports no fever, chills, nausea, vomiting, chest pain, shortness of breath.  Past Medical History: Allergies: No Known Allergies  Current Medications:  Current Outpatient Medications:    buPROPion (WELLBUTRIN SR) 150 MG 12 hr tablet, Take 1 (one) Tablet Tablet by mouth two times daily, Disp: , Rfl:    cephALEXin (KEFLEX) 500 MG capsule, Take 1 capsule (500 mg total) by mouth 4 (four) times daily for 3 days., Disp: 12 capsule, Rfl: 0   cholecalciferol (VITAMIN D) 1000 units tablet, Take 1,000 Units by mouth daily.,  Disp: , Rfl:    ferrous sulfate 325 (65 FE) MG tablet, Take 325 mg by mouth daily with breakfast., Disp: , Rfl:    HYDROcodone-acetaminophen (NORCO) 10-325 MG tablet, Take 1-2 tablets by mouth every 6 (six) hours as needed for moderate pain or severe pain., Disp: 56 tablet, Rfl: 0   irbesartan-hydrochlorothiazide (AVALIDE) 300-12.5 MG tablet, Take 1 tablet by mouth daily., Disp: , Rfl:    lidocaine (LMX) 4 % cream, Apply 1 application topically as needed (pain)., Disp: , Rfl:    ondansetron (ZOFRAN) 4 MG tablet, Take 1 tablet (4 mg total) by mouth every 8 (eight) hours as needed for nausea or vomiting., Disp: 20 tablet, Rfl: 0   OZEMPIC, 0.25 OR 0.5 MG/DOSE, 2 MG/1.5ML SOPN, SMARTSIG:1 SUB-Q Once a Week, Disp: , Rfl:    phentermine (ADIPEX-P) 37.5 MG tablet, Take 37.5 mg by mouth daily before breakfast., Disp: , Rfl:    solifenacin (VESICARE) 10 MG tablet, Take 10 mg by mouth daily., Disp: , Rfl:    anastrozole (ARIMIDEX) 1 MG tablet, Take 1 tablet (1 mg total) by mouth daily. (Patient not taking: Reported on 02/06/2021), Disp: 30 tablet, Rfl: 6  Past Medical Problems: Past Medical History:  Diagnosis Date   Anemia    Arthritis    Osteoarthritis   Breast cancer ([place])    Breast lump    Breast nodule    hypoechoic in right breast   Bursitis of shoulder  region    Cancer [facility])    breast   Family history of breast cancer 11/06/2020   Family history of pancreatic cancer 11/06/2020   Hypertension    Mass on back    Personal history of radiation therapy 2009/2010   PONV (postoperative nausea and vomiting)    Skin lesions, generalized     Past Surgical History: Past Surgical History:  Procedure Laterality Date   BREAST BIOPSY Right 04/29/2008   malignant   BREAST BIOPSY Right 11/24/2015   benign   BREAST LUMPECTOMY Right 06/04/2008   BREAST SURGERY  2009   R breast reexcision of margins   HYSTEROSCOPY WITH D & C N/A 11/06/2014   Procedure: DILATATION AND CURETTAGE /HYSTEROSCOPY with  resection and myomectomy;  Surgeon: Thurnell Lose, MD;  Location: [facility];  Service: Gynecology;  Laterality: N/A;   MASS EXCISION N/A 01/17/2018   Procedure: EXCISION  OF UPPER BACK MASS AND SKIN TAGS;  Surgeon: Stark Klein, MD;  Location: [place];  Service: General;  Laterality: N/A;   TOTAL HIP ARTHROPLASTY     right    Social History: Social History   Socioeconomic History   Marital status: Legally Separated    Spouse name: Not on file   Number of children: Not on file   Years of education: Not on file   Highest education level: Not on file  Occupational History   Not on file  Tobacco Use   Smoking status: Former    Packs/day: 1.00    Years: 20.00    Pack years: 20.00    Types: Cigarettes    Quit date: 04/26/2008    Years since quitting: 12.7   Smokeless tobacco: Never  Vaping Use   Vaping Use: Never used  Substance and Sexual Activity   Alcohol use: Yes    Comment: less than 1/wk   Drug use: No   Sexual activity: Not on file  Other Topics Concern   Not on file  Social History Narrative   Not on file   Social Determinants of Health   Financial Resource Strain: Not on file  Food Insecurity: Not on file  Transportation Needs: Not on file  Physical Activity: Not on file  Stress: Not on file  Social Connections: Not on file  Intimate Partner Violence: Not on file    Family History: Family History  Problem Relation Age of Onset   Hypertension Mother    Hypertension Father    Hypertension Brother    Breast cancer Paternal Aunt        dx before 38   Pancreatic cancer Paternal Grandfather        dx 41s    Review of Systems: Review of Systems  Constitutional: Negative.   Respiratory: Negative.    Cardiovascular: Negative.   Gastrointestinal: Negative.   Neurological: Negative.    Physical Exam: Vital Signs BP 132/89 (BP Location: Left Arm, Patient Position: Sitting, Cuff Size: Large)   Pulse 80   Ht 5\' 3"  (1.6 m)   Wt 234 lb 3.2 oz (106.2 kg)    SpO2 99%   BMI 41.49 kg/m   Physical Exam  Constitutional:      General: Not in acute distress.    Appearance: Normal appearance. Not ill-appearing.  HENT:     Head: Normocephalic and atraumatic.  Eyes:     Pupils: Pupils are equal, round Neck:     Musculoskeletal: Normal range of motion.  Cardiovascular:     Rate and Rhythm: Normal rate  Pulses: Normal pulses.  Pulmonary:     Effort: Pulmonary effort is normal. No respiratory distress.  Abdominal:     General: Abdomen is flat. There is no distension.  Musculoskeletal: Normal range of motion.  Skin:    General: Skin is warm and dry.     Findings: No erythema or rash.  Neurological:     General: No focal deficit present.     Mental Status: Alert and oriented to person, place, and time. Mental status is at baseline.     Motor: No weakness.  Psychiatric:        Mood and Affect: Mood normal.        Behavior: Behavior normal.    Assessment/Plan: The patient is scheduled for left breast oncoplastic reduction with Dr. Marla Roe.  Risks, benefits, and alternatives of procedure discussed, questions answered and consent obtained.    Smoking Status: Quit 2009; Counseling Given?  N/A Last Mammogram: 10/06/2020; Results: Left breast cancer, no malignancy in right breast.  Caprini Score: 8, high; Risk Factors include: Age, BMI rater than 40, current left breast cancer, on Arimidex and length of planned surgery. Recommendation for mechanical and pharmacological prophylaxis. Encourage early ambulation.  Patient may need Lovenox for 7 to 10 days postoperatively.  Will discuss with surgical team.  Pictures obtained:[email or web address]   Post-op Rx sent to pharmacy: Zofran, Keflex Patient is currently on Norco 10 that she takes for chronic hip pain, she reports that she uses this as needed. Will send clearance the patient's pain management provider for guidance on postop pain control.  Discussed this with patient.  Patient was provided with the  breast reduction and General Surgical Risk consent document and Pain Medication Agreement prior to their appointment.  They had adequate time to read through the risk consent documents and Pain Medication Agreement. We also discussed them in person together during this preop appointment. All of their questions were answered to their satisfaction.  Recommended calling if they have any further questions.  Risk consent form and Pain Medication Agreement to be scanned into patient's chart.  The risk that can be encountered with breast reduction were discussed and include the following but not limited to these:  Breast asymmetry, fluid accumulation, firmness of the breast, inability to breast feed, loss of nipple or areola, skin loss, decrease or no nipple sensation, fat necrosis of the breast tissue, bleeding, infection, healing delay.  There are risks of anesthesia, changes to skin sensation and injury to nerves or blood vessels.  The muscle can be temporarily or permanently injured.  You may have an allergic reaction to tape, suture, glue, blood products which can result in skin discoloration, swelling, pain, skin lesions, poor healing.  Any of these can lead to the need for revisonal surgery or stage procedures.  A reduction has potential to interfere with diagnostic procedures.  Nipple or breast piercing can increase risks of infection.  This procedure is best done when the breast is fully developed.  Changes in the breast will continue to occur over time.  Pregnancy can alter the outcomes of previous breast reduction surgery, weight gain and weigh loss can also effect the long term appearance.     Electronically signed by: Carola Rhine Jauan Wohl, PA-C 02/06/2021 10:39 AM

## 2021-02-05 NOTE — H&P (View-Only) (Signed)
Patient ID: Kelli Abbott, female    DOB: 1956-04-28, 65 y.o.   MRN: [id number]  Chief Complaint  Patient presents with   Pre-op Exam       ICD-10-CM   1. Ductal carcinoma in situ (DCIS) of left breast  D05.12       History of Present Illness: Kelli Abbott is a 65 y.o.  female  with a history of left breast cancer.  She presents for preoperative evaluation for upcoming procedure, left breast oncoplastic breast reduction, scheduled for 02/18/2021 with Dr.  Marla Roe  The patient has had problems with anesthesia.  Reports history of postoperative nausea vomiting.  No other complications with anesthesia.   No history of DVT/PE.  No family history of DVT/PE.  No family or personal history of bleeding or clotting disorders.  Patient is not currently taking any blood thinners.  No history of CVA/MI.   Summary of Previous Visit: Patient diagnosed with left breast cancer.  She had right breast cancer in 2009 and underwent a partial mastectomy followed by radiation.  Left breast biopsy was positive for ER/PR.  DCIS with intermediate grade lower inner quadrant.  She would like to be a C cup.  She is currently taking Arimidex.  She quit smoking several years ago.  PMH Significant for: Postoperative nausea vomiting, chronic hip pain, arthritis, anemia, hypertension.  Patient is currently on Norco 10 for chronic hip pain.  She reports that she has been feeling well lately.  Reports no fever, chills, nausea, vomiting, chest pain, shortness of breath.  Past Medical History: Allergies: No Known Allergies  Current Medications:  Current Outpatient Medications:    buPROPion (WELLBUTRIN SR) 150 MG 12 hr tablet, Take 1 (one) Tablet Tablet by mouth two times daily, Disp: , Rfl:    cephALEXin (KEFLEX) 500 MG capsule, Take 1 capsule (500 mg total) by mouth 4 (four) times daily for 3 days., Disp: 12 capsule, Rfl: 0   cholecalciferol (VITAMIN D) 1000 units tablet, Take 1,000 Units by mouth daily.,  Disp: , Rfl:    ferrous sulfate 325 (65 FE) MG tablet, Take 325 mg by mouth daily with breakfast., Disp: , Rfl:    HYDROcodone-acetaminophen (NORCO) 10-325 MG tablet, Take 1-2 tablets by mouth every 6 (six) hours as needed for moderate pain or severe pain., Disp: 56 tablet, Rfl: 0   irbesartan-hydrochlorothiazide (AVALIDE) 300-12.5 MG tablet, Take 1 tablet by mouth daily., Disp: , Rfl:    lidocaine (LMX) 4 % cream, Apply 1 application topically as needed (pain)., Disp: , Rfl:    ondansetron (ZOFRAN) 4 MG tablet, Take 1 tablet (4 mg total) by mouth every 8 (eight) hours as needed for nausea or vomiting., Disp: 20 tablet, Rfl: 0   OZEMPIC, 0.25 OR 0.5 MG/DOSE, 2 MG/1.5ML SOPN, SMARTSIG:1 SUB-Q Once a Week, Disp: , Rfl:    phentermine (ADIPEX-P) 37.5 MG tablet, Take 37.5 mg by mouth daily before breakfast., Disp: , Rfl:    solifenacin (VESICARE) 10 MG tablet, Take 10 mg by mouth daily., Disp: , Rfl:    anastrozole (ARIMIDEX) 1 MG tablet, Take 1 tablet (1 mg total) by mouth daily. (Patient not taking: Reported on 02/06/2021), Disp: 30 tablet, Rfl: 6  Past Medical Problems: Past Medical History:  Diagnosis Date   Anemia    Arthritis    Osteoarthritis   Breast cancer ([place])    Breast lump    Breast nodule    hypoechoic in right breast   Bursitis of shoulder  region    Cancer [facility])    breast   Family history of breast cancer 11/06/2020   Family history of pancreatic cancer 11/06/2020   Hypertension    Mass on back    Personal history of radiation therapy 2009/2010   PONV (postoperative nausea and vomiting)    Skin lesions, generalized     Past Surgical History: Past Surgical History:  Procedure Laterality Date   BREAST BIOPSY Right 04/29/2008   malignant   BREAST BIOPSY Right 11/24/2015   benign   BREAST LUMPECTOMY Right 06/04/2008   BREAST SURGERY  2009   R breast reexcision of margins   HYSTEROSCOPY WITH D & C N/A 11/06/2014   Procedure: DILATATION AND CURETTAGE /HYSTEROSCOPY with  resection and myomectomy;  Surgeon: Thurnell Lose, MD;  Location: [facility];  Service: Gynecology;  Laterality: N/A;   MASS EXCISION N/A 01/17/2018   Procedure: EXCISION  OF UPPER BACK MASS AND SKIN TAGS;  Surgeon: Stark Klein, MD;  Location: [place];  Service: General;  Laterality: N/A;   TOTAL HIP ARTHROPLASTY     right    Social History: Social History   Socioeconomic History   Marital status: Legally Separated    Spouse name: Not on file   Number of children: Not on file   Years of education: Not on file   Highest education level: Not on file  Occupational History   Not on file  Tobacco Use   Smoking status: Former    Packs/day: 1.00    Years: 20.00    Pack years: 20.00    Types: Cigarettes    Quit date: 04/26/2008    Years since quitting: 12.7   Smokeless tobacco: Never  Vaping Use   Vaping Use: Never used  Substance and Sexual Activity   Alcohol use: Yes    Comment: less than 1/wk   Drug use: No   Sexual activity: Not on file  Other Topics Concern   Not on file  Social History Narrative   Not on file   Social Determinants of Health   Financial Resource Strain: Not on file  Food Insecurity: Not on file  Transportation Needs: Not on file  Physical Activity: Not on file  Stress: Not on file  Social Connections: Not on file  Intimate Partner Violence: Not on file    Family History: Family History  Problem Relation Age of Onset   Hypertension Mother    Hypertension Father    Hypertension Brother    Breast cancer Paternal Aunt        dx before 68   Pancreatic cancer Paternal Grandfather        dx 65s    Review of Systems: Review of Systems  Constitutional: Negative.   Respiratory: Negative.    Cardiovascular: Negative.   Gastrointestinal: Negative.   Neurological: Negative.    Physical Exam: Vital Signs BP 132/89 (BP Location: Left Arm, Patient Position: Sitting, Cuff Size: Large)   Pulse 80   Ht 5\' 3"  (1.6 m)   Wt 234 lb 3.2 oz (106.2 kg)    SpO2 99%   BMI 41.49 kg/m   Physical Exam  Constitutional:      General: Not in acute distress.    Appearance: Normal appearance. Not ill-appearing.  HENT:     Head: Normocephalic and atraumatic.  Eyes:     Pupils: Pupils are equal, round Neck:     Musculoskeletal: Normal range of motion.  Cardiovascular:     Rate and Rhythm: Normal rate  Pulses: Normal pulses.  Pulmonary:     Effort: Pulmonary effort is normal. No respiratory distress.  Abdominal:     General: Abdomen is flat. There is no distension.  Musculoskeletal: Normal range of motion.  Skin:    General: Skin is warm and dry.     Findings: No erythema or rash.  Neurological:     General: No focal deficit present.     Mental Status: Alert and oriented to person, place, and time. Mental status is at baseline.     Motor: No weakness.  Psychiatric:        Mood and Affect: Mood normal.        Behavior: Behavior normal.    Assessment/Plan: The patient is scheduled for left breast oncoplastic reduction with Dr. Marla Roe.  Risks, benefits, and alternatives of procedure discussed, questions answered and consent obtained.    Smoking Status: Quit 2009; Counseling Given?  N/A Last Mammogram: 10/06/2020; Results: Left breast cancer, no malignancy in right breast.  Caprini Score: 8, high; Risk Factors include: Age, BMI rater than 40, current left breast cancer, on Arimidex and length of planned surgery. Recommendation for mechanical and pharmacological prophylaxis. Encourage early ambulation.  Patient may need Lovenox for 7 to 10 days postoperatively.  Will discuss with surgical team.  Pictures obtained:@[email or web address]   Post-op Rx sent to pharmacy: Zofran, Keflex Patient is currently on Norco 10 that she takes for chronic hip pain, she reports that she uses this as needed. Will send clearance the patient's pain management provider for guidance on postop pain control.  Discussed this with patient.  Patient was provided with the  breast reduction and General Surgical Risk consent document and Pain Medication Agreement prior to their appointment.  They had adequate time to read through the risk consent documents and Pain Medication Agreement. We also discussed them in person together during this preop appointment. All of their questions were answered to their satisfaction.  Recommended calling if they have any further questions.  Risk consent form and Pain Medication Agreement to be scanned into patient's chart.  The risk that can be encountered with breast reduction were discussed and include the following but not limited to these:  Breast asymmetry, fluid accumulation, firmness of the breast, inability to breast feed, loss of nipple or areola, skin loss, decrease or no nipple sensation, fat necrosis of the breast tissue, bleeding, infection, healing delay.  There are risks of anesthesia, changes to skin sensation and injury to nerves or blood vessels.  The muscle can be temporarily or permanently injured.  You may have an allergic reaction to tape, suture, glue, blood products which can result in skin discoloration, swelling, pain, skin lesions, poor healing.  Any of these can lead to the need for revisonal surgery or stage procedures.  A reduction has potential to interfere with diagnostic procedures.  Nipple or breast piercing can increase risks of infection.  This procedure is best done when the breast is fully developed.  Changes in the breast will continue to occur over time.  Pregnancy can alter the outcomes of previous breast reduction surgery, weight gain and weigh loss can also effect the long term appearance.     Electronically signed by: Carola Rhine Tanush Drees, PA-C 02/06/2021 10:39 AM

## 2021-02-06 ENCOUNTER — Ambulatory Visit (INDEPENDENT_AMBULATORY_CARE_PROVIDER_SITE_OTHER): Payer: Medicare Other | Admitting: Surgical

## 2021-02-06 ENCOUNTER — Other Ambulatory Visit: Payer: Self-pay

## 2021-02-06 ENCOUNTER — Encounter: Payer: Self-pay | Admitting: Surgical

## 2021-02-06 VITALS — BP 132/89 | HR 80 | Ht 63.0 in | Wt 234.2 lb

## 2021-02-06 DIAGNOSIS — D0512 Intraductal carcinoma in situ of left breast: Secondary | ICD-10-CM

## 2021-02-06 MED ORDER — ONDANSETRON HCL 4 MG PO TABS: 4.0000 mg | ORAL_TABLET | Freq: Three times a day (TID) | ORAL | 0 refills | Status: DC | PRN

## 2021-02-06 MED ORDER — CEPHALEXIN 500 MG PO CAPS: 500.0000 mg | ORAL_CAPSULE | Freq: Four times a day (QID) | ORAL | 0 refills | Status: AC

## 2021-02-09 ENCOUNTER — Ambulatory Visit (INDEPENDENT_AMBULATORY_CARE_PROVIDER_SITE_OTHER): Payer: Medicare Other

## 2021-02-09 ENCOUNTER — Encounter: Payer: Self-pay | Admitting: Podiatry

## 2021-02-09 ENCOUNTER — Ambulatory Visit (INDEPENDENT_AMBULATORY_CARE_PROVIDER_SITE_OTHER): Payer: Medicare Other | Admitting: Podiatry

## 2021-02-09 ENCOUNTER — Other Ambulatory Visit: Payer: Self-pay

## 2021-02-09 DIAGNOSIS — M778 Other enthesopathies, not elsewhere classified: Secondary | ICD-10-CM

## 2021-02-09 DIAGNOSIS — M79671 Pain in right foot: Secondary | ICD-10-CM

## 2021-02-09 DIAGNOSIS — D492 Neoplasm of unspecified behavior of bone, soft tissue, and skin: Secondary | ICD-10-CM

## 2021-02-09 MED ORDER — TRIAMCINOLONE ACETONIDE 10 MG/ML IJ SUSP
10.0000 mg | Freq: Once | INTRAMUSCULAR | Status: AC
  Administered 2021-02-09: 10 mg

## 2021-02-10 ENCOUNTER — Other Ambulatory Visit
Admission: RE | Admit: 2021-02-10 | Discharge: 2021-02-10 | Disposition: A | Discharge status: Home / Self Care | Payer: Medicare Other | Source: Ambulatory Visit | Attending: General Surgery | Admitting: General Surgery

## 2021-02-10 ENCOUNTER — Encounter: Payer: Self-pay | Admitting: Surgical

## 2021-02-10 ENCOUNTER — Other Ambulatory Visit: Payer: Self-pay | Admitting: Surgical

## 2021-02-10 MED ORDER — ENOXAPARIN SODIUM 40 MG/0.4ML IJ SOSY: 40.0000 mg | PREFILLED_SYRINGE | INTRAMUSCULAR | 0 refills | Status: DC

## 2021-02-10 NOTE — Patient Instructions (Signed)
Your procedure is scheduled on: Wednesday February 18, 2021. Report to Day Surgery inside [facility] 2nd floor.  To find out your arrival time please call [phone] between 1PM - 3PM on Tuesday February 17, 2021.  Remember: Instructions that are not followed completely may result in serious medical risk,  up to and including death, or upon the discretion of your surgeon and anesthesiologist your  surgery may need to be rescheduled.     _X__ 1. Do not eat food after midnight the night before your procedure.                 No chewing gum or hard candies.   __X__2.  On the morning of surgery brush your teeth with toothpaste and water, you                may rinse your mouth with mouthwash if you wish.  Do not swallow any toothpaste of mouthwash.     _X__ 3.  No Alcohol for 24 hours before or after surgery.   _X__ 4.  Do Not Smoke or use e-cigarettes For 24 Hours Prior to Your Surgery.                 Do not use any chewable tobacco products for at least 6 hours prior to                 Surgery.  _X__  5.  Do not use any recreational drugs (marijuana, cocaine, heroin, ecstasy, MDMA or other)                For at least one week prior to your surgery.  Combination of these drugs with anesthesia                May have life threatening results.  __X__6.  Notify your doctor if there is any change in your medical condition      (cold, fever, infections).     Do not wear jewelry, make-up, hairpins, clips or nail polish. Do not wear lotions, powders, or perfumes. You may wear deodorant. Do not shave 48 hours prior to surgery. Men may shave face and neck. Do not bring valuables to the hospital.    [facility] is not responsible for any belongings or valuables.  Contacts, dentures or bridgework may not be worn into surgery. Leave your suitcase in the car. After surgery it may be brought to your room. For patients admitted to the hospital, discharge time is determined by  your treatment team.   Patients discharged the day of surgery will not be allowed to drive home.   Make arrangements for someone to be with you for the first 24 hours of your Same Day Discharge.   __X__ Take these medicines the morning of surgery with A SIP OF WATER:    1. anastrozole (ARIMIDEX) 1 MG  2. solifenacin (VESICARE) 10 MG  3.   4.  5.  6.  ____ Fleet Enema (as directed)   __X__ Use CHG Soap (or wipes) as directed (patient stated has from last procedure).  ____ Use Benzoyl Peroxide Gel as instructed  ____ Use inhalers on the day of surgery  ____ Stop metformin 2 days prior to surgery    ____ Take 1/2 of usual insulin dose the night before surgery. No insulin the morning          of surgery.   ____ Call your PCP, cardiologist, or Pulmonologist if taking Coumadin/Plavix/aspirin and ask  when to stop before your surgery.   __X__ One Week prior to surgery- Stop Anti-inflammatories such as Ibuprofen, Aleve, Advil, Motrin, meloxicam (MOBIC), diclofenac, etodolac, ketorolac, Toradol, Daypro, piroxicam, Goody's or BC powders. OK TO USE TYLENOL IF NEEDED   __X__ Stop supplements until after surgery.    ____ Bring C-Pap to the hospital.    If you have any questions regarding your pre-procedure instructions,  Please call Pre-admit Testing at [phone].

## 2021-02-10 NOTE — Progress Notes (Signed)
Subjective:   Patient ID: Kelli Abbott, female   DOB: 65 y.o.   MRN: [id number]   HPI Patient presents stating she has a lot of pain in her right forefoot x5 months and has had a small mass on the outside of the right foot that is been present for years and is tender with shoe gear and has had it looked at in the past and wanted excision but the timing did not work out.  She is due to go in for breast cancer surgery towards the end of the month and patient does not smoke currently and likes to be active   Review of Systems  All other systems reviewed and are negative.      Objective:  Physical Exam Vitals and nursing note reviewed.  Constitutional:      Appearance: She is well-developed.  Pulmonary:     Effort: Pulmonary effort is normal.  Musculoskeletal:        General: Normal range of motion.  Skin:    General: Skin is warm.  Neurological:     Mental Status: She is alert.    Neurovascular status intact muscle strength adequate range of motion adequate with exquisite discomfort third metatarsal phalangeal joint right with fluid buildup and on the dorsal lateral aspect of the right foot there it appears to be a small nodule that appeared within subcutaneous tissue that is organized and appears to measure around 5 x 5 mm but it is very tender when pressed     Assessment:  Probability for inflammatory capsulitis dorsal right foot along with unknown soft tissue mass right that is most likely benign given the fact its been there for years but is tender     Plan:  8 NP x-ray reviewed I will get a focus on the forefoot and I did discuss excision of this mass but it would require open surgery and I do not recommend until after she has her other work done.  She agrees with me completely and today I did sterile prep I aspirated the third MPJ I got out a small amount of clear fluid I injected quarter cc dexamethasone Kenalog and thick padding applied.  Patient will be seen back and we may  need to do other things if this continues to persist  X-rays were negative for signs of fracture did indicate slightly elongated third metatarsal no indications of lateral calcification associated with a small soft tissue mass

## 2021-02-10 NOTE — Progress Notes (Signed)
Surgical Clearance has been received from Dr. Jeanella Anton, NP-C for patient's upcoming surgery left oncoplastic breast reduction with Dr. Marla Roe.  Jeanella Anton this patient's pain management provider.  She reported that she will resume pain management following surgical release.  Given clearance provided, will prescribe patient some postop Norco for surgical pain control.  We will send prescription day of surgery.

## 2021-02-18 ENCOUNTER — Ambulatory Visit
Admission: RE | Admit: 2021-02-18 | Discharge: 2021-02-18 | Disposition: A | Discharge status: Home / Self Care | Payer: Medicare Other | Source: Ambulatory Visit | Attending: General Surgery | Admitting: General Surgery

## 2021-02-18 ENCOUNTER — Ambulatory Visit: Payer: Medicare Other | Admitting: Certified Registered"

## 2021-02-18 ENCOUNTER — Other Ambulatory Visit: Payer: Self-pay | Admitting: General Surgery

## 2021-02-18 ENCOUNTER — Telehealth (INDEPENDENT_AMBULATORY_CARE_PROVIDER_SITE_OTHER): Payer: Medicare Other | Admitting: Plastic Surgery

## 2021-02-18 ENCOUNTER — Other Ambulatory Visit: Payer: Self-pay

## 2021-02-18 ENCOUNTER — Encounter
Admission: RE | Disposition: A | Discharge status: Home / Self Care | Payer: Self-pay | Source: Ambulatory Visit | Attending: General Surgery

## 2021-02-18 ENCOUNTER — Encounter: Payer: Self-pay | Admitting: General Surgery

## 2021-02-18 DIAGNOSIS — D0512 Intraductal carcinoma in situ of left breast: Secondary | ICD-10-CM | POA: Insufficient documentation

## 2021-02-18 DIAGNOSIS — Z79899 Other long term (current) drug therapy: Secondary | ICD-10-CM | POA: Insufficient documentation

## 2021-02-18 DIAGNOSIS — L821 Other seborrheic keratosis: Secondary | ICD-10-CM | POA: Diagnosis not present

## 2021-02-18 DIAGNOSIS — Z803 Family history of malignant neoplasm of breast: Secondary | ICD-10-CM | POA: Diagnosis not present

## 2021-02-18 DIAGNOSIS — N6489 Other specified disorders of breast: Secondary | ICD-10-CM | POA: Insufficient documentation

## 2021-02-18 DIAGNOSIS — Z87891 Personal history of nicotine dependence: Secondary | ICD-10-CM | POA: Diagnosis not present

## 2021-02-18 DIAGNOSIS — Z419 Encounter for procedure for purposes other than remedying health state, unspecified: Secondary | ICD-10-CM

## 2021-02-18 HISTORY — PX: BREAST LUMPECTOMY WITH NEEDLE LOCALIZATION: SHX5759

## 2021-02-18 HISTORY — PX: UNILATERAL BREAST REDUCTION: SHX6885

## 2021-02-18 SURGERY — BREAST LUMPECTOMY WITH NEEDLE LOCALIZATION
Anesthesia: General | Laterality: Left

## 2021-02-18 MED ORDER — BUPIVACAINE-EPINEPHRINE (PF) 0.5% -1:200000 IJ SOLN
INTRAMUSCULAR | Status: AC
  Filled 2021-02-18: qty 30

## 2021-02-18 MED ORDER — LIDOCAINE-EPINEPHRINE 1 %-1:100000 IJ SOLN
INTRAMUSCULAR | Status: DC | PRN
  Administered 2021-02-18: 20 mL

## 2021-02-18 MED ORDER — FENTANYL CITRATE (PF) 100 MCG/2ML IJ SOLN
INTRAMUSCULAR | Status: AC
  Filled 2021-02-18: qty 2

## 2021-02-18 MED ORDER — LACTATED RINGERS IV SOLN: INTRAVENOUS | Status: DC

## 2021-02-18 MED ORDER — CEFAZOLIN SODIUM-DEXTROSE 2-4 GM/100ML-% IV SOLN
2.0000 g | INTRAVENOUS | Status: AC
  Administered 2021-02-18: 2 g via INTRAVENOUS

## 2021-02-18 MED ORDER — ACETAMINOPHEN 325 MG PO TABS: 650.0000 mg | ORAL_TABLET | ORAL | Status: DC | PRN

## 2021-02-18 MED ORDER — KETAMINE HCL 10 MG/ML IJ SOLN
INTRAMUSCULAR | Status: DC | PRN
  Administered 2021-02-18: 30 mg via INTRAVENOUS
  Administered 2021-02-18: 20 mg via INTRAVENOUS

## 2021-02-18 MED ORDER — SUGAMMADEX SODIUM 200 MG/2ML IV SOLN
INTRAVENOUS | Status: DC | PRN
  Administered 2021-02-18: 200 mg via INTRAVENOUS

## 2021-02-18 MED ORDER — OXYCODONE HCL 5 MG PO TABS
ORAL_TABLET | ORAL | Status: AC
  Filled 2021-02-18: qty 1

## 2021-02-18 MED ORDER — FENTANYL CITRATE (PF) 100 MCG/2ML IJ SOLN
INTRAMUSCULAR | Status: DC | PRN
  Administered 2021-02-18 (×2): 50 ug via INTRAVENOUS

## 2021-02-18 MED ORDER — DEXAMETHASONE SODIUM PHOSPHATE 10 MG/ML IJ SOLN
INTRAMUSCULAR | Status: DC | PRN
  Administered 2021-02-18: 10 mg via INTRAVENOUS

## 2021-02-18 MED ORDER — CEFAZOLIN SODIUM-DEXTROSE 2-4 GM/100ML-% IV SOLN
INTRAVENOUS | Status: AC
  Filled 2021-02-18: qty 100

## 2021-02-18 MED ORDER — OXYCODONE HCL 5 MG PO TABS: 5.0000 mg | ORAL_TABLET | ORAL | Status: DC | PRN

## 2021-02-18 MED ORDER — OXYCODONE HCL 5 MG PO TABS
5.0000 mg | ORAL_TABLET | Freq: Once | ORAL | Status: AC | PRN
  Administered 2021-02-18: 5 mg via ORAL

## 2021-02-18 MED ORDER — OXYCODONE HCL 5 MG/5ML PO SOLN: 5.0000 mg | Freq: Once | ORAL | Status: AC | PRN

## 2021-02-18 MED ORDER — METHYLENE BLUE 0.5 % INJ SOLN
INTRAVENOUS | Status: AC
  Filled 2021-02-18: qty 10

## 2021-02-18 MED ORDER — MIDAZOLAM HCL 2 MG/2ML IJ SOLN
INTRAMUSCULAR | Status: AC
  Filled 2021-02-18: qty 2

## 2021-02-18 MED ORDER — CHLORHEXIDINE GLUCONATE 0.12 % MT SOLN: 15.0000 mL | Freq: Once | OROMUCOSAL | Status: AC

## 2021-02-18 MED ORDER — FENTANYL CITRATE (PF) 100 MCG/2ML IJ SOLN
25.0000 ug | INTRAMUSCULAR | Status: AC | PRN
  Administered 2021-02-18 (×4): 25 ug via INTRAVENOUS

## 2021-02-18 MED ORDER — MEPERIDINE HCL 25 MG/ML IJ SOLN: 6.2500 mg | INTRAMUSCULAR | Status: DC | PRN

## 2021-02-18 MED ORDER — LIDOCAINE-EPINEPHRINE 1 %-1:100000 IJ SOLN
INTRAMUSCULAR | Status: AC
  Filled 2021-02-18: qty 1

## 2021-02-18 MED ORDER — ACETAMINOPHEN 10 MG/ML IV SOLN
INTRAVENOUS | Status: AC
  Filled 2021-02-18: qty 100

## 2021-02-18 MED ORDER — SODIUM CHLORIDE 0.9% FLUSH: 3.0000 mL | INTRAVENOUS | Status: DC | PRN

## 2021-02-18 MED ORDER — APREPITANT 40 MG PO CAPS
ORAL_CAPSULE | ORAL | Status: AC
  Administered 2021-02-18: 40 mg via ORAL
  Filled 2021-02-18: qty 1

## 2021-02-18 MED ORDER — LIDOCAINE HCL (CARDIAC) PF 100 MG/5ML IV SOSY
PREFILLED_SYRINGE | INTRAVENOUS | Status: DC | PRN
  Administered 2021-02-18: 50 mg via INTRAVENOUS

## 2021-02-18 MED ORDER — FAMOTIDINE 20 MG PO TABS
ORAL_TABLET | ORAL | Status: AC
  Administered 2021-02-18: 20 mg via ORAL
  Filled 2021-02-18: qty 1

## 2021-02-18 MED ORDER — FENTANYL CITRATE (PF) 100 MCG/2ML IJ SOLN: 25.0000 ug | INTRAMUSCULAR | Status: DC | PRN

## 2021-02-18 MED ORDER — ONDANSETRON HCL 4 MG/2ML IJ SOLN
INTRAMUSCULAR | Status: DC | PRN
  Administered 2021-02-18: 4 mg via INTRAVENOUS

## 2021-02-18 MED ORDER — SODIUM CHLORIDE 0.9% FLUSH: 3.0000 mL | Freq: Two times a day (BID) | INTRAVENOUS | Status: DC

## 2021-02-18 MED ORDER — DEXMEDETOMIDINE (PRECEDEX) IN NS 20 MCG/5ML (4 MCG/ML) IV SYRINGE
PREFILLED_SYRINGE | INTRAVENOUS | Status: DC | PRN
  Administered 2021-02-18: 12 ug via INTRAVENOUS
  Administered 2021-02-18: 4 ug via INTRAVENOUS

## 2021-02-18 MED ORDER — CHLORHEXIDINE GLUCONATE CLOTH 2 % EX PADS
6.0000 | MEDICATED_PAD | Freq: Once | CUTANEOUS | Status: AC
  Administered 2021-02-18: 6 via TOPICAL

## 2021-02-18 MED ORDER — ROCURONIUM BROMIDE 100 MG/10ML IV SOLN
INTRAVENOUS | Status: DC | PRN
  Administered 2021-02-18: 10 mg via INTRAVENOUS
  Administered 2021-02-18: 60 mg via INTRAVENOUS

## 2021-02-18 MED ORDER — SODIUM CHLORIDE 0.9 % IV SOLN: 250.0000 mL | INTRAVENOUS | Status: DC | PRN

## 2021-02-18 MED ORDER — PROPOFOL 10 MG/ML IV BOLUS
INTRAVENOUS | Status: AC
  Filled 2021-02-18: qty 20

## 2021-02-18 MED ORDER — ACETAMINOPHEN 10 MG/ML IV SOLN
INTRAVENOUS | Status: DC | PRN
  Administered 2021-02-18: 1000 mg via INTRAVENOUS

## 2021-02-18 MED ORDER — PHENYLEPHRINE HCL (PRESSORS) 10 MG/ML IV SOLN
INTRAVENOUS | Status: DC | PRN
  Administered 2021-02-18 (×2): 50 ug via INTRAVENOUS

## 2021-02-18 MED ORDER — FENTANYL CITRATE (PF) 100 MCG/2ML IJ SOLN
INTRAMUSCULAR | Status: AC
  Administered 2021-02-18: 25 ug via INTRAVENOUS
  Filled 2021-02-18: qty 2

## 2021-02-18 MED ORDER — CHLORHEXIDINE GLUCONATE 0.12 % MT SOLN
OROMUCOSAL | Status: AC
  Administered 2021-02-18: 15 mL via OROMUCOSAL
  Filled 2021-02-18: qty 15

## 2021-02-18 MED ORDER — ACETAMINOPHEN 650 MG RE SUPP: 650.0000 mg | RECTAL | Status: DC | PRN

## 2021-02-18 MED ORDER — MIDAZOLAM HCL 2 MG/2ML IJ SOLN
INTRAMUSCULAR | Status: DC | PRN
  Administered 2021-02-18: 2 mg via INTRAVENOUS

## 2021-02-18 MED ORDER — APREPITANT 40 MG PO CAPS: 40.0000 mg | ORAL_CAPSULE | Freq: Once | ORAL | Status: AC

## 2021-02-18 MED ORDER — PROPOFOL 10 MG/ML IV BOLUS
INTRAVENOUS | Status: DC | PRN
  Administered 2021-02-18: 150 mg via INTRAVENOUS

## 2021-02-18 MED ORDER — ORAL CARE MOUTH RINSE: 15.0000 mL | Freq: Once | OROMUCOSAL | Status: AC

## 2021-02-18 MED ORDER — FAMOTIDINE 20 MG PO TABS: 20.0000 mg | ORAL_TABLET | Freq: Once | ORAL | Status: AC

## 2021-02-18 MED ORDER — PROMETHAZINE HCL 25 MG/ML IJ SOLN: 6.2500 mg | INTRAMUSCULAR | Status: DC | PRN

## 2021-02-18 SURGICAL SUPPLY — 92 items
ADH SKN CLS APL DERMABOND .7 (GAUZE/BANDAGES/DRESSINGS)
APL PRP STRL LF DISP 70% ISPRP (MISCELLANEOUS) ×2
BIOPATCH WHT 1IN DISK W/4.0 H (GAUZE/BANDAGES/DRESSINGS) IMPLANT
BLADE BOVIE TIP EXT 4 (BLADE) ×2 IMPLANT
BLADE SURG 15 STRL LF DISP TIS (BLADE) ×1 IMPLANT
BLADE SURG 15 STRL SS (BLADE)
BLADE SURG 15 STRL SS SAFETY (BLADE) ×4 IMPLANT
BLADE SURG SZ10 CARB STEEL (BLADE) ×2 IMPLANT
BULB RESERV EVAC DRAIN JP 100C (MISCELLANEOUS) IMPLANT
CANISTER SUCT 1200ML W/VALVE (MISCELLANEOUS) ×2 IMPLANT
CHLORAPREP W/TINT 26 (MISCELLANEOUS) ×3 IMPLANT
CNTNR SPEC 2.5X3XGRAD LEK (MISCELLANEOUS)
CONT SPEC 4OZ STER OR WHT (MISCELLANEOUS)
CONT SPEC 4OZ STRL OR WHT (MISCELLANEOUS)
CONTAINER SPEC 2.5X3XGRAD LEK (MISCELLANEOUS) IMPLANT
COVER PROBE FLX POLY STRL (MISCELLANEOUS) ×2 IMPLANT
DECANTER SPIKE VIAL GLASS SM (MISCELLANEOUS) ×2 IMPLANT
DERMABOND ADVANCED (GAUZE/BANDAGES/DRESSINGS)
DERMABOND ADVANCED .7 DNX12 (GAUZE/BANDAGES/DRESSINGS) ×2 IMPLANT
DEVICE DUBIN SPECIMEN MAMMOGRA (MISCELLANEOUS) ×2 IMPLANT
DRAIN CHANNEL 19F RND (DRAIN) IMPLANT
DRAIN CHANNEL JP 15F RND 16 (MISCELLANEOUS) IMPLANT
DRAPE CHEST BREAST 77X106 FENE (MISCELLANEOUS) ×1 IMPLANT
DRAPE LAPAROTOMY TRNSV 106X77 (MISCELLANEOUS) ×2 IMPLANT
DRSG GAUZE FLUFF 36X18 (GAUZE/BANDAGES/DRESSINGS) ×4 IMPLANT
DRSG TELFA 3X8 NADH (GAUZE/BANDAGES/DRESSINGS) ×2 IMPLANT
ELECT CAUTERY BLADE TIP 2.5 (TIP) ×2
ELECT REM PT RETURN 9FT ADLT (ELECTROSURGICAL) ×4
ELECTRODE CAUTERY BLDE TIP 2.5 (TIP) ×1 IMPLANT
ELECTRODE REM PT RTRN 9FT ADLT (ELECTROSURGICAL) ×2 IMPLANT
GAUZE 4X4 16PLY ~~LOC~~+RFID DBL (SPONGE) ×3 IMPLANT
GAUZE SPONGE 4X4 12PLY STRL (GAUZE/BANDAGES/DRESSINGS) ×1 IMPLANT
GLOVE SURG ENC MOIS LTX SZ6.5 (GLOVE) ×4 IMPLANT
GLOVE SURG ENC MOIS LTX SZ7.5 (GLOVE) ×5 IMPLANT
GLOVE SURG UNDER LTX SZ8 (GLOVE) ×5 IMPLANT
GOWN STRL REUS W/ TWL LRG LVL3 (GOWN DISPOSABLE) ×4 IMPLANT
GOWN STRL REUS W/TWL LRG LVL3 (GOWN DISPOSABLE) ×8
KIT MARKER MARGIN INK (KITS) ×1 IMPLANT
KIT TURNOVER KIT A (KITS) ×2 IMPLANT
LABEL OR SOLS (LABEL) ×2 IMPLANT
MANIFOLD NEPTUNE II (INSTRUMENTS) ×3 IMPLANT
MARGIN MAP 10MM (MISCELLANEOUS) ×2 IMPLANT
NDL FILTER BLUNT 18X1 1/2 (NEEDLE) ×1 IMPLANT
NDL HYPO 25X1 1.5 SAFETY (NEEDLE) ×2 IMPLANT
NDL SPNL 20GX3.5 QUINCKE YW (NEEDLE) IMPLANT
NEEDLE FILTER BLUNT 18X 1/2SAF (NEEDLE)
NEEDLE FILTER BLUNT 18X1 1/2 (NEEDLE) IMPLANT
NEEDLE HYPO 22GX1.5 SAFETY (NEEDLE) ×4 IMPLANT
NEEDLE HYPO 25X1 1.5 SAFETY (NEEDLE) ×4 IMPLANT
NEEDLE SPNL 20GX3.5 QUINCKE YW (NEEDLE) IMPLANT
NS IRRIG 1000ML POUR BTL (IV SOLUTION) ×1 IMPLANT
PACK BASIN MAJOR ARMC (MISCELLANEOUS) ×1 IMPLANT
PACK BASIN MINOR ARMC (MISCELLANEOUS) ×2 IMPLANT
PAD ABD DERMACEA PRESS 5X9 (GAUZE/BANDAGES/DRESSINGS) ×3 IMPLANT
PAD DRESSING TELFA 3X8 NADH (GAUZE/BANDAGES/DRESSINGS) ×1 IMPLANT
RETRACTOR RING XSMALL (MISCELLANEOUS) IMPLANT
RTRCTR WOUND ALEXIS 13CM XS SH (MISCELLANEOUS)
SHEARS FOC LG CVD HARMONIC 17C (MISCELLANEOUS) IMPLANT
SHEARS HARMONIC 9CM CVD (BLADE) IMPLANT
SLEVE PROBE SENORX GAMMA FIND (MISCELLANEOUS) IMPLANT
SOL PREP PVP 2OZ (MISCELLANEOUS)
SOLUTION PREP PVP 2OZ (MISCELLANEOUS) ×1 IMPLANT
SPONGE T-LAP 18X18 ~~LOC~~+RFID (SPONGE) ×4 IMPLANT
STRIP CLOSURE SKIN 1/2X4 (GAUZE/BANDAGES/DRESSINGS) ×3 IMPLANT
SUT ETHILON 3-0 FS-10 30 BLK (SUTURE) ×2
SUT MNCRL 3-0 UNDYED SH (SUTURE) ×2 IMPLANT
SUT MNCRL 4-0 (SUTURE) ×4
SUT MNCRL 4-0 27XMFL (SUTURE) ×2
SUT MNCRL+ 5-0 UNDYED PC-3 (SUTURE) ×2 IMPLANT
SUT MON AB 3-0 SH 27 (SUTURE) ×4 IMPLANT
SUT MON AB 5-0 P3 18 (SUTURE) ×4 IMPLANT
SUT MONOCRYL 3-0 UNDYED (SUTURE) ×4
SUT MONOCRYL 5-0 (SUTURE)
SUT PDS II 3-0 (SUTURE) ×2 IMPLANT
SUT PDS PLUS 2 (SUTURE)
SUT PDS PLUS AB 2-0 CT-1 (SUTURE) IMPLANT
SUT SILK 2 0 (SUTURE) ×2
SUT SILK 2-0 18XBRD TIE 12 (SUTURE) ×1 IMPLANT
SUT VIC AB 2-0 CT1 27 (SUTURE) ×4
SUT VIC AB 2-0 CT1 TAPERPNT 27 (SUTURE) ×2 IMPLANT
SUT VIC AB 3-0 SH 27 (SUTURE) ×4
SUT VIC AB 3-0 SH 27X BRD (SUTURE) ×2 IMPLANT
SUT VIC AB 4-0 FS2 27 (SUTURE) ×4 IMPLANT
SUT VICRYL+ 3-0 144IN (SUTURE) ×2 IMPLANT
SUTURE EHLN 3-0 FS-10 30 BLK (SUTURE) ×1 IMPLANT
SUTURE MNCRL 4-0 27XMF (SUTURE) ×2 IMPLANT
SWABSTK COMLB BENZOIN TINCTURE (MISCELLANEOUS) ×2 IMPLANT
SYR 10ML LL (SYRINGE) ×6 IMPLANT
SYR BULB IRRIG 60ML STRL (SYRINGE) ×3 IMPLANT
TAPE TRANSPORE STRL 2 31045 (GAUZE/BANDAGES/DRESSINGS) ×1 IMPLANT
TOWEL OR 17X26 4PK STRL BLUE (TOWEL DISPOSABLE) ×5 IMPLANT
WATER STERILE IRR 1000ML POUR (IV SOLUTION) ×2 IMPLANT

## 2021-02-18 NOTE — Discharge Instructions (Addendum)
INSTRUCTIONS FOR AFTER SURGERY   You will likely have some questions about what to expect following your operation.  The following information will help you and your family understand what to expect when you are discharged from the hospital.  Following these guidelines will help ensure a smooth recovery and reduce risks of complications.  Postoperative instructions include information on: diet, wound care, medications and physical activity.  AFTER SURGERY Expect to go home after the procedure.  In some cases, you may need to spend one night in the hospital for observation.  DIET This surgery does not require a specific diet.  However, I have to mention that the healthier you eat the better your body can start healing. It is important to increasing your protein intake.  This means limiting the foods with added sugar.  Focus on fruits and vegetables and some meat. It is very important to drink water after your surgery.  If your urine is bright yellow, then it is concentrated, and you need to drink more water.  As a general rule after surgery, you should have 8 ounces of water every hour while awake.  If you find you are persistently nauseated or unable to take in liquids let us know.  NO TOBACCO USE or EXPOSURE.  This will slow your healing process and increase the risk of a wound.  WOUND CARE If you don't have a drain: You can shower the day after surgery.  Use fragrance free soap.  Dial, [place], [place] and Cetaphil are usually mild on the skin.  If you have steri-strips / tape directly attached to your skin leave them in place. It is OK to get these wet.  No baths, pools or hot tubs for two weeks. We close your incision to leave the smallest and best-looking scar. No ointment or creams on your incisions until given the go ahead.  Especially not Neosporin (Too many skin reactions with this one).  A few weeks after surgery you can use Mederma and start massaging the scar. We ask you to wear your binder or  sports bra for the first 6 weeks around the clock, including while sleeping. This provides added comfort and helps reduce the fluid accumulation at the surgery site.  ACTIVITY No heavy lifting until cleared by the doctor.  It is OK to walk and climb stairs. In fact, moving your legs is very important to decrease your risk of a blood clot.  It will also help keep you from getting deconditioned.  Every 1 to 2 hours get up and walk for 5 minutes. This will help with a quicker recovery back to normal.  Let pain be your guide so you don't do too much.  NO, you cannot do the spring cleaning and don't plan on taking care of anyone else.  This is your time for TLC.   WORK Everyone returns to work at different times. As a rough guide, most people take at least 1 - 2 weeks off prior to returning to work. If you need documentation for your job, bring the forms to your postoperative follow up visit.  DRIVING Arrange for someone to bring you home from the hospital.  You may be able to drive a few days after surgery but not while taking any narcotics or valium.  BOWEL MOVEMENTS Constipation can occur after anesthesia and while taking pain medication.  It is important to stay ahead for your comfort.  We recommend taking Milk of Magnesia (2 tablespoons; twice a day) while taking  the pain pills.  SEROMA This is fluid your body tried to put in the surgical site.  This is normal but if it creates excessive pain and swelling let us know.  It usually decreases in a few weeks.  MEDICATIONS and PAIN CONTROL At your preoperative visit for you history and physical you were given the following medications: An antibiotic: Start this medication when you get home and take according to the instructions on the bottle. Zofran 4 mg:  This is to treat nausea and vomiting.  You can take this every 6 hours as needed and only if needed. Norco (hydrocodone/acetaminophen) 5/325 mg:  This is only to be used after you have taken the  motrin or the tylenol. Every 8 hours as needed. Over the counter Medication to take: Ibuprofen (Motrin) 600 mg:  Take this every 6 hours.  If you have additional pain then take 500 mg of the tylenol.  Only take the Norco after you have tried these two. Miralax or stool softener of choice: Take this according to the bottle if you take the [place] Call your surgeon's office if any of the following occur:  Fever 101 degrees F or greater  Excessive bleeding or fluid from the incision site.  Pain that increases over time without aid from the medications  Redness, warmth, or pus draining from incision sites  Persistent nausea or inability to take in liquids  Severe misshapen area that underwent the operation. AMBULATORY SURGERY  DISCHARGE INSTRUCTIONS   The drugs that you were given will stay in your system until tomorrow so for the next 24 hours you should not:  Drive an automobile Make any legal decisions Drink any alcoholic beverage   You may resume regular meals tomorrow.  Today it is better to start with liquids and gradually work up to solid foods.  You may eat anything you prefer, but it is better to start with liquids, then soup and crackers, and gradually work up to solid foods.   Please notify your doctor immediately if you have any unusual bleeding, trouble breathing, redness and pain at the surgery site, drainage, fever, or pain not relieved by medication.     Your post-operative visit with Dr.                                       is: Date:                        Time:    Please call to schedule your post-operative visit.  Additional Instructions:

## 2021-02-18 NOTE — Transfer of Care (Signed)
Immediate Anesthesia Transfer of Care Note  Patient: Kelli Abbott  Procedure(s) Performed: BREAST LUMPECTOMY WITH NEEDLE LOCALIZATION (Left) LEFT ONCOPLASTIC BREAST REDUCTION (Left)  Patient Location: PACU  Anesthesia Type:General  Level of Consciousness: awake and drowsy  Airway & Oxygen Therapy: Patient Spontanous Breathing and Patient connected to face mask oxygen  Post-op Assessment: Report given to RN and Post -op Vital signs reviewed and stable  Post vital signs: Reviewed and stable  Last Vitals:  Vitals Value Taken Time  BP 173/100 02/18/21 1438  Temp    Pulse 68 02/18/21 1440  Resp 16 02/18/21 1440  SpO2 100 % 02/18/21 1440  Vitals shown include unvalidated device data.  Last Pain:  Vitals:   02/18/21 1039  PainSc: 0-No pain         Complications: No notable events documented.

## 2021-02-18 NOTE — Interval H&P Note (Signed)
History and Physical Interval Note:  02/18/2021 12:28 PM  Kelli Abbott  has presented today for surgery, with the diagnosis of DCIS left breast.  The various methods of treatment have been discussed with the patient and family. After consideration of risks, benefits and other options for treatment, the patient has consented to  Procedure(s) with comments: BREAST LUMPECTOMY WITH NEEDLE LOCALIZATION (Left) - combined case with Dr. Marla Roe LEFT ONCOPLASTIC BREAST REDUCTION (Left) as a surgical intervention.  The patient's history has been reviewed, patient examined, no change in status, stable for surgery.  I have reviewed the patient's chart and labs.  Questions were answered to the patient's satisfaction.     [name]

## 2021-02-18 NOTE — Op Note (Signed)
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  Profound breast asymmetry  Postoperative diagnosis: Same.  Operative procedure: Left breast wide excision, reduction mastoplasty.  Operating surgeon: Hervey Ard, MD.,  Audelia Hives, DO  Anesthesia: General.  Estimated blood loss: Less than 10 cc.  Clinical note: This 65 year old woman who had a contralateral cancer in 2009 was noted with new areas of microcalcification.  She subsequently underwent 2 biopsy showing DCIS.  Because of the 2 cup size asymmetry she was felt to be a candidate for wide local excision and oncoplastic reconstruction.  She received Ancef prior to the procedure.  SCD stockings for DVT prevention.  Operative note: The patient underwent wire localization the morning of the procedure.  1 wire was placed at the site of each previously placed marking clip and a posterior wire to include the extent of known calcifications was placed as well.  The patient underwent general anesthesia and tolerated this well.  The incision was outlined by the plastic surgeon and the skin was incised sharply and dissection completed with electrocautery and the knife.  Thick flaps laterally were completed and the localizing wires were brought into the field.  A thin flap medially towards the undersurface of the nipple was created as the anterior biopsy site was quite close to the skin.  The third wire was identified as was the "X" clip.  These were anchored into position with 3-0 nylon sutures.  The breast tissue was divided below the level of the medial aspect of the areola and carried deep to the posterior wire.  This block of tissue was then inked and specimen radiograph confirmed 3 wire tips and 2 markers.  Confirmed on radiology review.  Pathologist reported no gross lesions with the whole thing to be submitted for pathologic review.  At this time the procedure was taken over by Dr.Dillingham and will be dictated separately.

## 2021-02-18 NOTE — Anesthesia Preprocedure Evaluation (Signed)
Anesthesia Evaluation  Patient identified by MRN, date of birth, ID band Patient awake    Reviewed: Allergy & Precautions, NPO status , Patient's Chart, lab work & pertinent test results  History of Anesthesia Complications (+) PONV and history of anesthetic complications  Airway Mallampati: I  TM Distance: >3 FB Neck ROM: Full    Dental  (+) Poor Dentition, Missing   Pulmonary neg sleep apnea, neg COPD, former smoker,    breath sounds clear to auscultation- rhonchi (-) wheezing      Cardiovascular Exercise Tolerance: Good hypertension, Pt. on medications (-) CAD, (-) Past MI, (-) Cardiac Stents and (-) CABG  Rhythm:Regular Rate:Normal - Systolic murmurs and - Diastolic murmurs    Neuro/Psych neg Seizures negative neurological ROS  negative psych ROS   GI/Hepatic negative GI ROS, Neg liver ROS,   Endo/Other  negative endocrine ROSneg diabetes  Renal/GU negative Renal ROS     Musculoskeletal  (+) Arthritis ,   Abdominal (+) + obese,   Peds  Hematology  (+) anemia ,   Anesthesia Other Findings Past Medical History: No date: Anemia No date: Arthritis     Comment:  Osteoarthritis No date: Breast cancer (HCC) No date: Breast lump No date: Breast nodule     Comment:  hypoechoic in right breast No date: Bursitis of shoulder region No date: Cancer [facility])     Comment:  breast 11/06/2020: Family history of breast cancer 11/06/2020: Family history of pancreatic cancer No date: Hypertension No date: Mass on back 2009/2010: Personal history of radiation therapy No date: PONV (postoperative nausea and vomiting) No date: Skin lesions, generalized   Reproductive/Obstetrics                             Anesthesia Physical Anesthesia Plan  ASA: 2  Anesthesia Plan: General   Post-op Pain Management:    Induction: Intravenous  PONV Risk Score and Plan: 3 and Ondansetron, Dexamethasone and  Aprepitant  Airway Management Planned: Oral ETT  Additional Equipment:   Intra-op Plan:   Post-operative Plan: Extubation in OR  Informed Consent: I have reviewed the patients History and Physical, chart, labs and discussed the procedure including the risks, benefits and alternatives for the proposed anesthesia with the patient or authorized representative who has indicated his/her understanding and acceptance.     Dental advisory given  Plan Discussed with: CRNA and Anesthesiologist  Anesthesia Plan Comments:         Anesthesia Quick Evaluation

## 2021-02-18 NOTE — H&P (Signed)
Kelli Abbott [id number] 01/08/1956     HPI: 65 year old woman with recent identified DCIS of the left breast.  Prior contralateral cancer in 2009 with significant breast volume loss secondary to radiation.  Candidate for wide excision and reduction mammoplasty.  She tolerated wire localization well.    Medications Prior to Admission  Medication Sig Dispense Refill Last Dose   acetaminophen (TYLENOL) 650 MG CR tablet Take 650 mg by mouth every 8 (eight) hours as needed for pain.   02/17/2021   anastrozole (ARIMIDEX) 1 MG tablet Take 1 tablet (1 mg total) by mouth daily. 30 tablet 6 02/18/2021   cholecalciferol (VITAMIN D) 1000 units tablet Take 1,000 Units by mouth daily.   02/17/2021   enoxaparin (LOVENOX) 40 MG/0.4ML injection Inject 0.4 mLs (40 mg total) into the skin daily for 10 days. 4 mL 0 02/17/2021   ferrous sulfate 325 (65 FE) MG tablet Take 325 mg by mouth daily with breakfast.   02/17/2021   HYDROcodone-acetaminophen (NORCO) 10-325 MG tablet Take 1-2 tablets by mouth every 6 (six) hours as needed for moderate pain or severe pain. 56 tablet 0 02/17/2021   irbesartan-hydrochlorothiazide (AVALIDE) 300-12.5 MG tablet Take 1 tablet by mouth daily.   02/17/2021   olopatadine (PATANOL) 0.1 % ophthalmic solution Place 1 drop into both eyes 2 (two) times daily as needed for allergies.   02/17/2021   OZEMPIC, 0.25 OR 0.5 MG/DOSE, 2 MG/1.5ML SOPN Inject 0.25 mg into the skin every Thursday.   02/17/2021   phentermine (ADIPEX-P) 37.5 MG tablet Take 37.5 mg by mouth daily before breakfast.   02/17/2021   solifenacin (VESICARE) 10 MG tablet Take 10 mg by mouth daily.   02/18/2021   ondansetron (ZOFRAN) 4 MG tablet Take 1 tablet (4 mg total) by mouth every 8 (eight) hours as needed for nausea or vomiting. 20 tablet 0    Allergies  Allergen Reactions   Bupropion Hcl     Contributing to memory loss   Past Medical History:  Diagnosis Date   Anemia    Arthritis    Osteoarthritis   Breast cancer ([place])     Breast lump    Breast nodule    hypoechoic in right breast   Bursitis of shoulder region    Cancer [facility])    breast   Family history of breast cancer 11/06/2020   Family history of pancreatic cancer 11/06/2020   Hypertension    Mass on back    Personal history of radiation therapy 2009/2010   PONV (postoperative nausea and vomiting)    Skin lesions, generalized    Past Surgical History:  Procedure Laterality Date   BREAST BIOPSY Right 04/29/2008   malignant   BREAST BIOPSY Right 11/24/2015   benign   BREAST LUMPECTOMY Right 06/04/2008   BREAST SURGERY  2009   R breast reexcision of margins   HYSTEROSCOPY WITH D & C N/A 11/06/2014   Procedure: DILATATION AND CURETTAGE /HYSTEROSCOPY with resection and myomectomy;  Surgeon: Thurnell Lose, MD;  Location: [place] [facility];  Service: Gynecology;  Laterality: N/A;   MASS EXCISION N/A 01/17/2018   Procedure: EXCISION  OF UPPER BACK MASS AND SKIN TAGS;  Surgeon: Stark Klein, MD;  Location: [place];  Service: General;  Laterality: N/A;   TOTAL HIP ARTHROPLASTY  04/2020   right   Social History   Socioeconomic History   Marital status: Legally Separated    Spouse name: Not on file   Number of children: Not on file   Years  of education: Not on file   Highest education level: Not on file  Occupational History   Not on file  Tobacco Use   Smoking status: Former    Packs/day: 1.00    Years: 20.00    Pack years: 20.00    Types: Cigarettes    Quit date: 04/26/2008    Years since quitting: 12.8   Smokeless tobacco: Never  Vaping Use   Vaping Use: Never used  Substance and Sexual Activity   Alcohol use: Not Currently    Comment: less than 1/wk   Drug use: No   Sexual activity: Not on file  Other Topics Concern   Not on file  Social History Narrative   Not on file   Social Determinants of Health   Financial Resource Strain: Not on file  Food Insecurity: Not on file  Transportation Needs: Not on file  Physical Activity: Not on file   Stress: Not on file  Social Connections: Not on file  Intimate Partner Violence: Not on file   Social History   Social History Narrative   Not on file     ROS: Negative.     PE: HEENT: Negative. Lungs: Clear. Cardio: RR.   Assessment/Plan:  Proceed with planned wide excision left breast with oncoplastic reduction.    Forest Gleason [facility] 02/18/2021

## 2021-02-18 NOTE — Anesthesia Procedure Notes (Signed)
Procedure Name: Intubation Date/Time: 02/18/2021 12:52 PM Performed by: Biagio Borg, CRNA Pre-anesthesia Checklist: Patient identified, Emergency Drugs available, Suction available and Patient being monitored Patient Re-evaluated:Patient Re-evaluated prior to induction Oxygen Delivery Method: Circle system utilized Preoxygenation: Pre-oxygenation with 100% oxygen Induction Type: IV induction Ventilation: Mask ventilation without difficulty Grade View: Grade I Tube type: Oral Tube size: 7.0 mm Number of attempts: 1 Airway Equipment and Method: Stylet Placement Confirmation: ETT inserted through vocal cords under direct vision, positive ETCO2 and breath sounds checked- equal and bilateral Secured at: 21 cm Tube secured with: Tape Dental Injury: Teeth and Oropharynx as per pre-operative assessment

## 2021-02-18 NOTE — Op Note (Signed)
Breast Reduction Op note:    DATE OF PROCEDURE: 02/18/2021  LOCATION: [facility]  SURGEON: Ravynn Hogate Sanger Lisa Milian, DO  PREOPERATIVE DIAGNOSIS 1. Breast asymmetry after treatment for breast cancer  POSTOPERATIVE DIAGNOSIS same  PROCEDURES 1. Left breast mastopexy  COMPLICATIONS: None.  DRAINS: none  INDICATIONS FOR PROCEDURE Kelli Abbott is a 65 y.o. year-old female born on Nov 18, 1955,with a history of symptomatic macromastia with concominant back pain, neck pain, shoulder grooving from her bra.   MRN: [id number]  CONSENT Informed consent was obtained directly from the patient. The risks, benefits and alternatives were fully discussed. Specific risks including but not limited to bleeding, infection, hematoma, seroma, scarring, pain, nipple necrosis, asymmetry, poor cosmetic results, and need for further surgery were discussed. The patient had ample opportunity to have her questions answered to her satisfaction.  DESCRIPTION OF PROCEDURE  Patient was brought into the operating room and placed in a supine position under the care of General surgery.  SCDs were placed and appropriate padding was performed.  Antibiotics were given. The patient underwent general anesthesia and the chest was prepped and draped in a sterile fashion.  A timeout was performed and all information was confirmed to be correct.  General surgery performed the partial mastectomy as I assisted throughout with the retraction and assisting.  Left side: Preoperative markings were confirmed.  Incision lines were injected with local with epinephrine.  After waiting for vasoconstriction, the marked lines were incised.  A Wise-pattern mastopexy was performed by de-epithelializing the vertical limb.  The partial mastectomy was in the medial quadrant.  Therefore, I was sure to preserve the breast mound base and inferior pedicle. Care was taken to not undermine the breast pedicle. Hemostasis was achieved.  The  nipple was gently rotated into position and the soft tissue was closed with 3 and 4-0 Monocryl.  The deep tissues were approximated with 3-0 PDS and 3-0 Monocryl sutures and the skin was closed with deep dermal and subcuticular 4-0 Monocryl sutures.  Dermabond was applied.  A breast binder and ABDs were placed.  The nipple and skin flaps had good capillary refill at the end of the procedure.  The patient tolerated the procedure well. The patient was allowed to wake from anesthesia and taken to the recovery room in satisfactory condition.

## 2021-02-19 ENCOUNTER — Encounter: Payer: Self-pay | Admitting: General Surgery

## 2021-02-19 ENCOUNTER — Telehealth: Payer: Self-pay

## 2021-02-19 NOTE — Telephone Encounter (Signed)
Returned patients call. She had mastopexy of the left breast on 02/18/2021 with Dr. Marla Roe and is requesting 2-3 tablets of Oxycodone for pain. Nothing was prescribed from our office for after surgery care. At the time of discharge, she was given 1 tablet. Currently under pain management care due to hip pain and has a follow up appointment with them is on 03/06/21. Dr. Terri Piedra is sending in pain medication on 03/06/2021. Patient is aware the oxycodone is on hold due to insurance. She will try to continue taking Tylenol. Reminded her the next PO follow up with Dr. Marla Roe on 03/03/2021 at 1:15 PM.  Should she have any further questions or concerns, please call our office. Patient understood and agreed with plan.

## 2021-02-19 NOTE — Telephone Encounter (Signed)
Patient called to say that they gave her oxycodone after her surgery yesterday and it worked well.  She said that she has taken Tylenol, but it isn't helping.  Patient said she is having shooting pain.  She asked if we would be willing to give her enough oxycodone for 2-3 days.  She said that she doesn't want a whole lot, but just enough for 2-3 days.  Please call.

## 2021-02-24 ENCOUNTER — Other Ambulatory Visit: Payer: Self-pay | Admitting: Pathology

## 2021-02-24 ENCOUNTER — Encounter: Payer: Self-pay | Admitting: *Deleted

## 2021-02-25 LAB — SURGICAL PATHOLOGY

## 2021-02-26 ENCOUNTER — Encounter: Payer: Self-pay | Admitting: *Deleted

## 2021-02-26 ENCOUNTER — Ambulatory Visit: Payer: Medicare Other | Admitting: Hematology and Oncology

## 2021-03-02 NOTE — Anesthesia Postprocedure Evaluation (Signed)
Anesthesia Post Note  Patient: Kelli Abbott  Procedure(s) Performed: BREAST LUMPECTOMY WITH NEEDLE LOCALIZATION (Left) LEFT ONCOPLASTIC BREAST REDUCTION (Left)  Patient location during evaluation: PACU Anesthesia Type: General Level of consciousness: awake and alert and oriented Pain management: pain level controlled Vital Signs Assessment: post-procedure vital signs reviewed and stable Respiratory status: spontaneous breathing, nonlabored ventilation and respiratory function stable Cardiovascular status: blood pressure returned to baseline and stable Postop Assessment: no signs of nausea or vomiting Anesthetic complications: no   No notable events documented.   Last Vitals:  Vitals:   02/18/21 1544 02/18/21 1615  BP: 140/89 119/72  Pulse: 60 67  Resp: 15 16  Temp: (!) 36.1 C   SpO2: 99% 97%    Last Pain:  Vitals:   02/19/21 0838  TempSrc:   PainSc: 0-No pain                 Maiyah Goyne

## 2021-03-03 ENCOUNTER — Encounter: Payer: Self-pay | Admitting: Plastic Surgery

## 2021-03-03 ENCOUNTER — Other Ambulatory Visit: Payer: Self-pay

## 2021-03-03 ENCOUNTER — Ambulatory Visit (INDEPENDENT_AMBULATORY_CARE_PROVIDER_SITE_OTHER): Payer: Medicare Other | Admitting: Plastic Surgery

## 2021-03-03 DIAGNOSIS — D0512 Intraductal carcinoma in situ of left breast: Secondary | ICD-10-CM

## 2021-03-03 NOTE — Progress Notes (Signed)
   Subjective:    Patient ID: Kelli Abbott, female    DOB: [birth year], 65 y.o.   MRN: [id number]  The patient is a 65 year old female here for follow-up after undergoing an oncoplastic mastopexy of the left breast.  She has positive margins and will need further surgery.  She was given the options by Dr. Bary Castilla.  She is most likely going to choose a left mastectomy.  Her incisions are healing very nicely and she actually has a really good shape.     Review of Systems  Constitutional: Negative.   Eyes: Negative.   Respiratory: Negative.    Cardiovascular: Negative.   Endocrine: Negative.   Genitourinary: Negative.       Objective:   Physical Exam Vitals and nursing note reviewed.  Constitutional:      Appearance: Normal appearance.  Cardiovascular:     Rate and Rhythm: Normal rate.     Pulses: Normal pulses.  Neurological:     Mental Status: She is alert. Mental status is at baseline.  Psychiatric:        Mood and Affect: Mood normal.       Assessment & Plan:     ICD-10-CM   1. Ductal carcinoma in situ (DCIS) of left breast  D05.12       The patient is most likely undergo with any left mastectomy.  She does not want reconstruction.  I think this is very reasonable.  We can do a mastopexy reduction on the right breast after she is healed up.  This will give her a little better options for her an implant in her bra.  She is happy with that decision.  She just does not want to go through another lumpectomy with the possibility of not getting it all. Pictures were obtained of the patient and placed in the chart with the patient's or guardian's permission.

## 2021-03-24 ENCOUNTER — Other Ambulatory Visit: Payer: Self-pay | Admitting: General Surgery

## 2021-03-24 ENCOUNTER — Encounter: Payer: Medicare Other | Admitting: Physician Assistant

## 2021-03-24 DIAGNOSIS — D0512 Intraductal carcinoma in situ of left breast: Secondary | ICD-10-CM

## 2021-03-24 NOTE — Progress Notes (Deleted)
Patient is a 65 year old female s/p oncoplastic mastopexy left breast performed 02/18/2021 by Dr. Marla Roe who presents to clinic for postoperative follow-up.  Patient was found to have had positive margins and will need additional surgical intervention.  I reviewed her medical record and during last clinic visit she stated that she is likely going to proceed with left sided mastectomy.  She did not want reconstruction.  They discussed mastopexy reduction on right breast at later date.     *** Find out when the surgery is planned with Dr. Bary Castilla ***

## 2021-03-27 ENCOUNTER — Encounter: Payer: Self-pay | Admitting: *Deleted

## 2021-03-27 ENCOUNTER — Telehealth: Payer: Self-pay | Admitting: *Deleted

## 2021-03-27 NOTE — Telephone Encounter (Signed)
Left message for a return phone call regarding her plan with sx and xrt and whether she would continue at [facility] or come back [facility] for treatment.

## 2021-03-31 ENCOUNTER — Encounter: Payer: Self-pay | Admitting: *Deleted

## 2021-04-08 ENCOUNTER — Ambulatory Visit
Admission: RE | Admit: 2021-04-08 | Discharge: 2021-04-08 | Disposition: A | Discharge status: Home / Self Care | Payer: Medicare Other | Source: Ambulatory Visit | Attending: Radiation Oncology | Admitting: Radiation Oncology

## 2021-04-08 ENCOUNTER — Encounter: Payer: Self-pay | Admitting: Radiation Oncology

## 2021-04-08 VITALS — BP 115/88 | HR 81 | Temp 98.5°F | Resp 20 | Wt 247.0 lb

## 2021-04-08 DIAGNOSIS — Z803 Family history of malignant neoplasm of breast: Secondary | ICD-10-CM | POA: Diagnosis not present

## 2021-04-08 DIAGNOSIS — Z79899 Other long term (current) drug therapy: Secondary | ICD-10-CM | POA: Diagnosis not present

## 2021-04-08 DIAGNOSIS — Z923 Personal history of irradiation: Secondary | ICD-10-CM | POA: Diagnosis not present

## 2021-04-08 DIAGNOSIS — I10 Essential (primary) hypertension: Secondary | ICD-10-CM | POA: Diagnosis not present

## 2021-04-08 DIAGNOSIS — D0512 Intraductal carcinoma in situ of left breast: Secondary | ICD-10-CM | POA: Diagnosis not present

## 2021-04-08 DIAGNOSIS — Z79811 Long term (current) use of aromatase inhibitors: Secondary | ICD-10-CM | POA: Diagnosis not present

## 2021-04-08 DIAGNOSIS — Z800 Family history of malignant neoplasm of digestive organs: Secondary | ICD-10-CM | POA: Insufficient documentation

## 2021-04-08 DIAGNOSIS — Z170 Estrogen receptor positive status [ER+]: Secondary | ICD-10-CM | POA: Insufficient documentation

## 2021-04-08 DIAGNOSIS — F17210 Nicotine dependence, cigarettes, uncomplicated: Secondary | ICD-10-CM | POA: Diagnosis not present

## 2021-04-08 NOTE — Progress Notes (Signed)
NEW PATIENT EVALUATION  Name: Kelli Abbott  MRN: [id number]  Date:   04/08/2021     DOB: 06/05/1956   This 65 y.o. 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  ER positive status post wide local excision with focal positive margin  REFERRING PHYSICIAN: Wenda Low, MD  CHIEF COMPLAINT: No chief complaint on file.   DIAGNOSIS: The encounter diagnosis was Ductal carcinoma in situ (DCIS) of left breast.   PREVIOUS INVESTIGATIONS:  Mammograms and ultrasound reviewed Clinical notes reviewed Pathology report reviewed  HPI: Patient is a 65 year old female status post lumpectomy and whole breast radiation to her right breast in the past.  She recently presented with a 7.3 x 7.3 cm area of indeterminate calcifications in the lower inner left breast.  No evidence of malignancy in the right breast.  She underwent biopsy which was positive for ductal carcinoma in situ.  She underwent a wide local excision with oncoplastic reconstruction.  Again ER positive ductal carcinoma in situ intermediate to high-grade with comedonecrosis was seen.  Tumor was at least 55 mm nuclear grade 2-3.  Inferior margin was involved unifocal he is well as less than 2 mm from her lateral posterior margin.  She is tolerated surgery well is seen today for evaluation she specifically denies breast tenderness cough or bone pain.  PLANNED TREATMENT REGIMEN: Whole breast radiation plus boost to scar  PAST MEDICAL HISTORY:  has a past medical history of Anemia, Arthritis, Breast cancer ([place]), Breast lump, Breast nodule, Bursitis of shoulder region, Cancer [facility]), Family history of breast cancer (11/06/2020), Family history of pancreatic cancer (11/06/2020), Hypertension, Mass on back, Personal history of radiation therapy (2009/2010), PONV (postoperative nausea and vomiting), and Skin lesions, generalized.    PAST SURGICAL HISTORY:  Past Surgical History:   Procedure Laterality Date   BREAST BIOPSY Right 04/29/2008   malignant   BREAST BIOPSY Right 11/24/2015   benign   BREAST LUMPECTOMY Right 06/04/2008   BREAST LUMPECTOMY WITH NEEDLE LOCALIZATION Left 02/18/2021   Procedure: BREAST LUMPECTOMY WITH NEEDLE LOCALIZATION;  Surgeon: Robert Bellow, MD;  Location: [facility];  Service: General;  Laterality: Left;  combined case with Dr. Marla Roe   BREAST SURGERY  2009   R breast reexcision of margins   HYSTEROSCOPY WITH D & C N/A 11/06/2014   Procedure: DILATATION AND CURETTAGE /HYSTEROSCOPY with resection and myomectomy;  Surgeon: Thurnell Lose, MD;  Location: [place] [facility];  Service: Gynecology;  Laterality: N/A;   MASS EXCISION N/A 01/17/2018   Procedure: EXCISION  OF UPPER BACK MASS AND SKIN TAGS;  Surgeon: Stark Klein, MD;  Location: [place];  Service: General;  Laterality: N/A;   TOTAL HIP ARTHROPLASTY  04/2020   right   UNILATERAL BREAST REDUCTION Left 02/18/2021   Procedure: LEFT ONCOPLASTIC BREAST REDUCTION;  Surgeon: Wallace Going, DO;  Location: [facility];  Service: Plastics;  Laterality: Left;    FAMILY HISTORY: family history includes Breast cancer in her paternal aunt; Hypertension in her brother, father, and mother; Pancreatic cancer in her paternal grandfather.  SOCIAL HISTORY:  reports that she quit smoking about 12 years ago. Her smoking use included cigarettes. She has a 20.00 pack-year smoking history. She has never used smokeless tobacco. She reports that she does not currently use alcohol. She reports that she does not use drugs.  ALLERGIES: Bupropion hcl  MEDICATIONS:  Current Outpatient Medications  Medication Sig Dispense Refill   acetaminophen (TYLENOL) 650  MG CR tablet Take 650 mg by mouth every 8 (eight) hours as needed for pain.     anastrozole (ARIMIDEX) 1 MG tablet Take 1 tablet (1 mg total) by mouth daily. 30 tablet 6   baclofen (LIORESAL) 10 MG tablet Take 10 mg by mouth 3 (three) times daily.      cholecalciferol (VITAMIN D) 1000 units tablet Take 1,000 Units by mouth daily.     ferrous sulfate 325 (65 FE) MG tablet Take 325 mg by mouth daily with breakfast.     HYDROcodone-acetaminophen (NORCO) 10-325 MG tablet Take 1-2 tablets by mouth every 6 (six) hours as needed for moderate pain or severe pain. 56 tablet 0   irbesartan-hydrochlorothiazide (AVALIDE) 300-12.5 MG tablet Take 1 tablet by mouth daily.     olopatadine (PATANOL) 0.1 % ophthalmic solution Place 1 drop into both eyes 2 (two) times daily as needed for allergies.     ondansetron (ZOFRAN) 4 MG tablet Take 1 tablet (4 mg total) by mouth every 8 (eight) hours as needed for nausea or vomiting. 20 tablet 0   OZEMPIC, 0.25 OR 0.5 MG/DOSE, 2 MG/1.5ML SOPN Inject 0.25 mg into the skin every Thursday.     phentermine (ADIPEX-P) 37.5 MG tablet Take 37.5 mg by mouth daily before breakfast.     solifenacin (VESICARE) 10 MG tablet Take 10 mg by mouth daily.     No current facility-administered medications for this encounter.    ECOG PERFORMANCE STATUS:  0 - Asymptomatic  REVIEW OF SYSTEMS: Patient denies any weight loss, fatigue, weakness, fever, chills or night sweats. Patient denies any loss of vision, blurred vision. Patient denies any ringing  of the ears or hearing loss. No irregular heartbeat. Patient denies heart murmur or history of fainting. Patient denies any chest pain or pain radiating to her upper extremities. Patient denies any shortness of breath, difficulty breathing at night, cough or hemoptysis. Patient denies any swelling in the lower legs. Patient denies any nausea vomiting, vomiting of blood, or coffee ground material in the vomitus. Patient denies any stomach pain. Patient states has had normal bowel movements no significant constipation or diarrhea. Patient denies any dysuria, hematuria or significant nocturia. Patient denies any problems walking, swelling in the joints or loss of balance. Patient denies any skin changes,  loss of hair or loss of weight. Patient denies any excessive worrying or anxiety or significant depression. Patient denies any problems with insomnia. Patient denies excessive thirst, polyuria, polydipsia. Patient denies any swollen glands, patient denies easy bruising or easy bleeding. Patient denies any recent infections, allergies or URI. Patient "s visual fields have not changed significantly in recent time.   PHYSICAL EXAM: BP 115/88   Pulse 81   Temp 98.5 F (36.9 C) (Tympanic)   Resp 20   Wt 247 lb (112 kg)   BMI 43.75 kg/m  Patient has rather large pendulous breasts.  She is status post wide local excision with oncoplastic reconstruction with excellent cosmetic result.  No dominant masses noted in either breast.  No axillary or supraclavicular adenopathy is identified.  Well-developed well-nourished patient in NAD. HEENT reveals PERLA, EOMI, discs not visualized.  Oral cavity is clear. No oral mucosal lesions are identified. Neck is clear without evidence of cervical or supraclavicular adenopathy. Lungs are clear to A&P. Cardiac examination is essentially unremarkable with regular rate and rhythm without murmur rub or thrill. Abdomen is benign with no organomegaly or masses noted. Motor sensory and DTR levels are equal and symmetric in the  upper and lower extremities. Cranial nerves II through XII are grossly intact. Proprioception is intact. No peripheral adenopathy or edema is identified. No motor or sensory levels are noted. Crude visual fields are within normal range.  LABORATORY DATA: Pathology report reviewed    RADIOLOGY RESULTS: Mammogram and ultrasound reviewed compatible with above-stated findings   IMPRESSION: Stage 0 ductal carcinoma in situ of the left breast status post wide local excision oncoplastic reconstruction with positive margin in 65 year old female  PLAN: At this time patient has an excellent cosmetic result.  She has a very large breast and would benefit from  whole breast radiation up to 5040 cGy in 28 fractions also boost her scar another 1600 cGy using electron beam.  I believe further surgery would be difficult to pinpoint exact areas of focal margin involvement and I would hope my electron-beam boost would add further insurance of ipsilateral breast recurrence prevention.  Patient is aware there is a slightly higher chance of recurrence now based on the positive margin although this is DCIS would not affect her overall survival and she would necessitate mastectomy at that time should that occur.  Risks and benefits of radiation including skin reaction fatigue alteration of blood counts possible inclusion of superficial lung all were discussed in detail with the patient.  I personally set up and ordered CT simulation for early next week.  Patient also will benefit from antiestrogen therapy.  Patient was discussed in depth with Dr. Tollie Pizza and is in agreement with our treatment plan.  I would like to take this opportunity to thank you for allowing me to participate in the care of your patient.Noreene Filbert, MD

## 2021-04-13 ENCOUNTER — Encounter: Payer: Self-pay | Admitting: *Deleted

## 2021-04-13 ENCOUNTER — Ambulatory Visit
Admission: RE | Admit: 2021-04-13 | Discharge: 2021-04-13 | Disposition: A | Discharge status: Home / Self Care | Payer: Medicare Other | Source: Ambulatory Visit | Attending: Radiation Oncology | Admitting: Radiation Oncology

## 2021-04-13 DIAGNOSIS — Z510 Encounter for antineoplastic radiation therapy: Secondary | ICD-10-CM | POA: Diagnosis not present

## 2021-04-13 DIAGNOSIS — D0512 Intraductal carcinoma in situ of left breast: Secondary | ICD-10-CM | POA: Insufficient documentation

## 2021-04-15 ENCOUNTER — Telehealth: Payer: Self-pay | Admitting: Hematology and Oncology

## 2021-04-15 NOTE — Telephone Encounter (Signed)
Called to scheduled appt per sch msg. Spoke with patient and she will call back to schedule after she speaks with her other doctor.

## 2021-04-16 DIAGNOSIS — Z510 Encounter for antineoplastic radiation therapy: Secondary | ICD-10-CM | POA: Diagnosis not present

## 2021-04-17 ENCOUNTER — Other Ambulatory Visit: Payer: Self-pay | Admitting: *Deleted

## 2021-04-17 ENCOUNTER — Other Ambulatory Visit: Payer: Self-pay | Admitting: Hematology and Oncology

## 2021-04-17 DIAGNOSIS — D0512 Intraductal carcinoma in situ of left breast: Secondary | ICD-10-CM

## 2021-04-20 ENCOUNTER — Ambulatory Visit: Admission: RE | Admit: 2021-04-20 | Payer: Medicare Other | Source: Ambulatory Visit

## 2021-04-20 DIAGNOSIS — Z510 Encounter for antineoplastic radiation therapy: Secondary | ICD-10-CM | POA: Diagnosis not present

## 2021-04-21 ENCOUNTER — Ambulatory Visit
Admission: RE | Admit: 2021-04-21 | Discharge: 2021-04-21 | Disposition: A | Discharge status: Home / Self Care | Payer: Medicare Other | Source: Ambulatory Visit | Attending: Radiation Oncology | Admitting: Radiation Oncology

## 2021-04-21 DIAGNOSIS — Z510 Encounter for antineoplastic radiation therapy: Secondary | ICD-10-CM | POA: Diagnosis not present

## 2021-04-22 ENCOUNTER — Ambulatory Visit
Admission: RE | Admit: 2021-04-22 | Discharge: 2021-04-22 | Disposition: A | Discharge status: Home / Self Care | Payer: Medicare Other | Source: Ambulatory Visit | Attending: Radiation Oncology | Admitting: Radiation Oncology

## 2021-04-22 DIAGNOSIS — Z510 Encounter for antineoplastic radiation therapy: Secondary | ICD-10-CM | POA: Diagnosis not present

## 2021-04-23 ENCOUNTER — Ambulatory Visit
Admission: RE | Admit: 2021-04-23 | Discharge: 2021-04-23 | Disposition: A | Discharge status: Home / Self Care | Payer: Medicare Other | Source: Ambulatory Visit | Attending: Radiation Oncology | Admitting: Radiation Oncology

## 2021-04-23 DIAGNOSIS — Z510 Encounter for antineoplastic radiation therapy: Secondary | ICD-10-CM | POA: Diagnosis not present

## 2021-04-24 ENCOUNTER — Ambulatory Visit
Admission: RE | Admit: 2021-04-24 | Discharge: 2021-04-24 | Disposition: A | Discharge status: Home / Self Care | Payer: Medicare Other | Source: Ambulatory Visit | Attending: Radiation Oncology | Admitting: Radiation Oncology

## 2021-04-24 DIAGNOSIS — Z510 Encounter for antineoplastic radiation therapy: Secondary | ICD-10-CM | POA: Diagnosis not present

## 2021-04-27 ENCOUNTER — Ambulatory Visit
Admission: RE | Admit: 2021-04-27 | Discharge: 2021-04-27 | Disposition: A | Discharge status: Home / Self Care | Payer: Medicare Other | Source: Ambulatory Visit | Attending: Radiation Oncology | Admitting: Radiation Oncology

## 2021-04-27 DIAGNOSIS — D0512 Intraductal carcinoma in situ of left breast: Secondary | ICD-10-CM | POA: Insufficient documentation

## 2021-04-27 DIAGNOSIS — Z510 Encounter for antineoplastic radiation therapy: Secondary | ICD-10-CM | POA: Insufficient documentation

## 2021-04-27 NOTE — Telephone Encounter (Signed)
message

## 2021-04-28 ENCOUNTER — Ambulatory Visit
Admission: RE | Admit: 2021-04-28 | Discharge: 2021-04-28 | Disposition: A | Discharge status: Home / Self Care | Payer: Medicare Other | Source: Ambulatory Visit | Attending: Radiation Oncology | Admitting: Radiation Oncology

## 2021-04-28 DIAGNOSIS — Z510 Encounter for antineoplastic radiation therapy: Secondary | ICD-10-CM | POA: Diagnosis not present

## 2021-04-29 ENCOUNTER — Ambulatory Visit
Admission: RE | Admit: 2021-04-29 | Discharge: 2021-04-29 | Disposition: A | Discharge status: Home / Self Care | Payer: Medicare Other | Source: Ambulatory Visit | Attending: Radiation Oncology | Admitting: Radiation Oncology

## 2021-04-29 DIAGNOSIS — Z510 Encounter for antineoplastic radiation therapy: Secondary | ICD-10-CM | POA: Diagnosis not present

## 2021-04-30 ENCOUNTER — Ambulatory Visit
Admission: RE | Admit: 2021-04-30 | Discharge: 2021-04-30 | Disposition: A | Discharge status: Home / Self Care | Payer: Medicare Other | Source: Ambulatory Visit | Attending: Radiation Oncology | Admitting: Radiation Oncology

## 2021-04-30 DIAGNOSIS — Z510 Encounter for antineoplastic radiation therapy: Secondary | ICD-10-CM | POA: Diagnosis not present

## 2021-05-01 ENCOUNTER — Ambulatory Visit
Admission: RE | Admit: 2021-05-01 | Discharge: 2021-05-01 | Disposition: A | Discharge status: Home / Self Care | Payer: Medicare Other | Source: Ambulatory Visit | Attending: Radiation Oncology | Admitting: Radiation Oncology

## 2021-05-01 DIAGNOSIS — Z510 Encounter for antineoplastic radiation therapy: Secondary | ICD-10-CM | POA: Diagnosis not present

## 2021-05-04 ENCOUNTER — Ambulatory Visit
Admission: RE | Admit: 2021-05-04 | Discharge: 2021-05-04 | Disposition: A | Discharge status: Home / Self Care | Payer: Medicare Other | Source: Ambulatory Visit | Attending: Radiation Oncology | Admitting: Radiation Oncology

## 2021-05-04 DIAGNOSIS — Z510 Encounter for antineoplastic radiation therapy: Secondary | ICD-10-CM | POA: Diagnosis not present

## 2021-05-05 ENCOUNTER — Inpatient Hospital Stay: Payer: Medicare Other | Attending: Radiation Oncology

## 2021-05-05 ENCOUNTER — Ambulatory Visit
Admission: RE | Admit: 2021-05-05 | Discharge: 2021-05-05 | Disposition: A | Discharge status: Home / Self Care | Payer: Medicare Other | Source: Ambulatory Visit | Attending: Radiation Oncology | Admitting: Radiation Oncology

## 2021-05-05 DIAGNOSIS — Z510 Encounter for antineoplastic radiation therapy: Secondary | ICD-10-CM | POA: Diagnosis not present

## 2021-05-05 DIAGNOSIS — D0512 Intraductal carcinoma in situ of left breast: Secondary | ICD-10-CM | POA: Insufficient documentation

## 2021-05-05 LAB — CBC
HCT: 44.2 % (ref 36.0–46.0)
Hemoglobin: 14.3 g/dL (ref 12.0–15.0)
MCH: 29.3 pg (ref 26.0–34.0)
MCHC: 32.4 g/dL (ref 30.0–36.0)
MCV: 90.6 fL (ref 80.0–100.0)
Platelets: 286 10*3/uL (ref 150–400)
RBC: 4.88 MIL/uL (ref 3.87–5.11)
RDW: 14.6 % (ref 11.5–15.5)
WBC: 8 10*3/uL (ref 4.0–10.5)
nRBC: 0 % (ref 0.0–0.2)

## 2021-05-06 ENCOUNTER — Ambulatory Visit
Admission: RE | Admit: 2021-05-06 | Discharge: 2021-05-06 | Disposition: A | Discharge status: Home / Self Care | Payer: Medicare Other | Source: Ambulatory Visit | Attending: Radiation Oncology | Admitting: Radiation Oncology

## 2021-05-06 DIAGNOSIS — Z510 Encounter for antineoplastic radiation therapy: Secondary | ICD-10-CM | POA: Diagnosis not present

## 2021-05-07 ENCOUNTER — Ambulatory Visit
Admission: RE | Admit: 2021-05-07 | Discharge: 2021-05-07 | Disposition: A | Discharge status: Home / Self Care | Payer: Medicare Other | Source: Ambulatory Visit | Attending: Radiation Oncology | Admitting: Radiation Oncology

## 2021-05-07 DIAGNOSIS — Z510 Encounter for antineoplastic radiation therapy: Secondary | ICD-10-CM | POA: Diagnosis not present

## 2021-05-08 ENCOUNTER — Ambulatory Visit
Admission: RE | Admit: 2021-05-08 | Discharge: 2021-05-08 | Disposition: A | Discharge status: Home / Self Care | Payer: Medicare Other | Source: Ambulatory Visit | Attending: Radiation Oncology | Admitting: Radiation Oncology

## 2021-05-08 DIAGNOSIS — Z510 Encounter for antineoplastic radiation therapy: Secondary | ICD-10-CM | POA: Diagnosis not present

## 2021-05-11 ENCOUNTER — Ambulatory Visit
Admission: RE | Admit: 2021-05-11 | Discharge: 2021-05-11 | Disposition: A | Discharge status: Home / Self Care | Payer: Medicare Other | Source: Ambulatory Visit | Attending: Radiation Oncology | Admitting: Radiation Oncology

## 2021-05-11 DIAGNOSIS — Z510 Encounter for antineoplastic radiation therapy: Secondary | ICD-10-CM | POA: Diagnosis not present

## 2021-05-12 ENCOUNTER — Ambulatory Visit
Admission: RE | Admit: 2021-05-12 | Discharge: 2021-05-12 | Disposition: A | Discharge status: Home / Self Care | Payer: Medicare Other | Source: Ambulatory Visit | Attending: Radiation Oncology | Admitting: Radiation Oncology

## 2021-05-12 ENCOUNTER — Encounter: Payer: Self-pay | Admitting: *Deleted

## 2021-05-12 DIAGNOSIS — Z510 Encounter for antineoplastic radiation therapy: Secondary | ICD-10-CM | POA: Diagnosis not present

## 2021-05-13 ENCOUNTER — Ambulatory Visit
Admission: RE | Admit: 2021-05-13 | Discharge: 2021-05-13 | Disposition: A | Discharge status: Home / Self Care | Payer: Medicare Other | Source: Ambulatory Visit | Attending: Radiation Oncology | Admitting: Radiation Oncology

## 2021-05-13 DIAGNOSIS — Z510 Encounter for antineoplastic radiation therapy: Secondary | ICD-10-CM | POA: Diagnosis not present

## 2021-05-14 ENCOUNTER — Ambulatory Visit
Admission: RE | Admit: 2021-05-14 | Discharge: 2021-05-14 | Disposition: A | Discharge status: Home / Self Care | Payer: Medicare Other | Source: Ambulatory Visit | Attending: Radiation Oncology | Admitting: Radiation Oncology

## 2021-05-14 DIAGNOSIS — Z510 Encounter for antineoplastic radiation therapy: Secondary | ICD-10-CM | POA: Diagnosis not present

## 2021-05-15 ENCOUNTER — Ambulatory Visit
Admission: RE | Admit: 2021-05-15 | Discharge: 2021-05-15 | Disposition: A | Discharge status: Home / Self Care | Payer: Medicare Other | Source: Ambulatory Visit | Attending: Radiation Oncology | Admitting: Radiation Oncology

## 2021-05-15 DIAGNOSIS — Z510 Encounter for antineoplastic radiation therapy: Secondary | ICD-10-CM | POA: Diagnosis not present

## 2021-05-18 ENCOUNTER — Telehealth: Payer: Self-pay | Admitting: Hematology and Oncology

## 2021-05-18 ENCOUNTER — Other Ambulatory Visit: Payer: Self-pay | Admitting: Hematology and Oncology

## 2021-05-18 ENCOUNTER — Ambulatory Visit
Admission: RE | Admit: 2021-05-18 | Discharge: 2021-05-18 | Disposition: A | Discharge status: Home / Self Care | Payer: Medicare Other | Source: Ambulatory Visit | Attending: Radiation Oncology | Admitting: Radiation Oncology

## 2021-05-18 DIAGNOSIS — Z510 Encounter for antineoplastic radiation therapy: Secondary | ICD-10-CM | POA: Diagnosis not present

## 2021-05-18 NOTE — Telephone Encounter (Signed)
Attempted to schedule per 10/24 inbasket, pt denied appt and requested to call back in when scheduling

## 2021-05-19 ENCOUNTER — Ambulatory Visit
Admission: RE | Admit: 2021-05-19 | Discharge: 2021-05-19 | Disposition: A | Discharge status: Home / Self Care | Payer: Medicare Other | Source: Ambulatory Visit | Attending: Radiation Oncology | Admitting: Radiation Oncology

## 2021-05-19 ENCOUNTER — Inpatient Hospital Stay: Payer: Medicare Other

## 2021-05-19 ENCOUNTER — Other Ambulatory Visit: Payer: Self-pay

## 2021-05-19 DIAGNOSIS — Z510 Encounter for antineoplastic radiation therapy: Secondary | ICD-10-CM | POA: Diagnosis not present

## 2021-05-19 DIAGNOSIS — D0512 Intraductal carcinoma in situ of left breast: Secondary | ICD-10-CM

## 2021-05-19 LAB — CBC
HCT: 41.1 % (ref 36.0–46.0)
Hemoglobin: 13.2 g/dL (ref 12.0–15.0)
MCH: 28.9 pg (ref 26.0–34.0)
MCHC: 32.1 g/dL (ref 30.0–36.0)
MCV: 89.9 fL (ref 80.0–100.0)
Platelets: 259 10*3/uL (ref 150–400)
RBC: 4.57 MIL/uL (ref 3.87–5.11)
RDW: 14.3 % (ref 11.5–15.5)
WBC: 9 10*3/uL (ref 4.0–10.5)
nRBC: 0 % (ref 0.0–0.2)

## 2021-05-20 ENCOUNTER — Ambulatory Visit
Admission: RE | Admit: 2021-05-20 | Discharge: 2021-05-20 | Disposition: A | Discharge status: Home / Self Care | Payer: Medicare Other | Source: Ambulatory Visit | Attending: Radiation Oncology | Admitting: Radiation Oncology

## 2021-05-20 DIAGNOSIS — Z510 Encounter for antineoplastic radiation therapy: Secondary | ICD-10-CM | POA: Diagnosis not present

## 2021-05-21 ENCOUNTER — Ambulatory Visit
Admission: RE | Admit: 2021-05-21 | Discharge: 2021-05-21 | Disposition: A | Discharge status: Home / Self Care | Payer: Medicare Other | Source: Ambulatory Visit | Attending: Radiation Oncology | Admitting: Radiation Oncology

## 2021-05-21 DIAGNOSIS — Z510 Encounter for antineoplastic radiation therapy: Secondary | ICD-10-CM | POA: Diagnosis not present

## 2021-05-22 ENCOUNTER — Ambulatory Visit
Admission: RE | Admit: 2021-05-22 | Discharge: 2021-05-22 | Disposition: A | Discharge status: Home / Self Care | Payer: Medicare Other | Source: Ambulatory Visit | Attending: Radiation Oncology | Admitting: Radiation Oncology

## 2021-05-22 DIAGNOSIS — Z510 Encounter for antineoplastic radiation therapy: Secondary | ICD-10-CM | POA: Diagnosis not present

## 2021-05-25 ENCOUNTER — Ambulatory Visit
Admission: RE | Admit: 2021-05-25 | Discharge: 2021-05-25 | Disposition: A | Discharge status: Home / Self Care | Payer: Medicare Other | Source: Ambulatory Visit | Attending: Radiation Oncology | Admitting: Radiation Oncology

## 2021-05-25 DIAGNOSIS — Z510 Encounter for antineoplastic radiation therapy: Secondary | ICD-10-CM | POA: Diagnosis not present

## 2021-05-26 ENCOUNTER — Ambulatory Visit
Admission: RE | Admit: 2021-05-26 | Discharge: 2021-05-26 | Disposition: A | Discharge status: Home / Self Care | Payer: Medicare Other | Source: Ambulatory Visit | Attending: Radiation Oncology | Admitting: Radiation Oncology

## 2021-05-26 DIAGNOSIS — Z510 Encounter for antineoplastic radiation therapy: Secondary | ICD-10-CM | POA: Insufficient documentation

## 2021-05-26 DIAGNOSIS — D0512 Intraductal carcinoma in situ of left breast: Secondary | ICD-10-CM | POA: Insufficient documentation

## 2021-05-27 ENCOUNTER — Ambulatory Visit
Admission: RE | Admit: 2021-05-27 | Discharge: 2021-05-27 | Disposition: A | Discharge status: Home / Self Care | Payer: Medicare Other | Source: Ambulatory Visit | Attending: Radiation Oncology | Admitting: Radiation Oncology

## 2021-05-27 DIAGNOSIS — Z510 Encounter for antineoplastic radiation therapy: Secondary | ICD-10-CM | POA: Diagnosis not present

## 2021-05-28 ENCOUNTER — Ambulatory Visit
Admission: RE | Admit: 2021-05-28 | Discharge: 2021-05-28 | Disposition: A | Discharge status: Home / Self Care | Payer: Medicare Other | Source: Ambulatory Visit | Attending: Radiation Oncology | Admitting: Radiation Oncology

## 2021-05-28 DIAGNOSIS — Z510 Encounter for antineoplastic radiation therapy: Secondary | ICD-10-CM | POA: Diagnosis not present

## 2021-05-29 ENCOUNTER — Ambulatory Visit
Admission: RE | Admit: 2021-05-29 | Discharge: 2021-05-29 | Disposition: A | Discharge status: Home / Self Care | Payer: Medicare Other | Source: Ambulatory Visit | Attending: Radiation Oncology | Admitting: Radiation Oncology

## 2021-05-29 DIAGNOSIS — Z510 Encounter for antineoplastic radiation therapy: Secondary | ICD-10-CM | POA: Diagnosis not present

## 2021-06-01 ENCOUNTER — Ambulatory Visit
Admission: RE | Admit: 2021-06-01 | Discharge: 2021-06-01 | Disposition: A | Discharge status: Home / Self Care | Payer: Medicare Other | Source: Ambulatory Visit | Attending: Radiation Oncology | Admitting: Radiation Oncology

## 2021-06-01 DIAGNOSIS — Z510 Encounter for antineoplastic radiation therapy: Secondary | ICD-10-CM | POA: Diagnosis not present

## 2021-06-02 ENCOUNTER — Other Ambulatory Visit: Payer: Self-pay | Admitting: *Deleted

## 2021-06-02 ENCOUNTER — Ambulatory Visit
Admission: RE | Admit: 2021-06-02 | Discharge: 2021-06-02 | Disposition: A | Discharge status: Home / Self Care | Payer: Medicare Other | Source: Ambulatory Visit | Attending: Radiation Oncology | Admitting: Radiation Oncology

## 2021-06-02 DIAGNOSIS — D0512 Intraductal carcinoma in situ of left breast: Secondary | ICD-10-CM

## 2021-06-02 DIAGNOSIS — Z510 Encounter for antineoplastic radiation therapy: Secondary | ICD-10-CM | POA: Diagnosis not present

## 2021-06-03 ENCOUNTER — Ambulatory Visit
Admission: RE | Admit: 2021-06-03 | Discharge: 2021-06-03 | Disposition: A | Discharge status: Home / Self Care | Payer: Medicare Other | Source: Ambulatory Visit | Attending: Radiation Oncology | Admitting: Radiation Oncology

## 2021-06-03 ENCOUNTER — Encounter: Payer: Self-pay | Admitting: *Deleted

## 2021-06-03 DIAGNOSIS — Z510 Encounter for antineoplastic radiation therapy: Secondary | ICD-10-CM | POA: Diagnosis not present

## 2021-06-04 ENCOUNTER — Ambulatory Visit
Admission: RE | Admit: 2021-06-04 | Discharge: 2021-06-04 | Disposition: A | Discharge status: Home / Self Care | Payer: Medicare Other | Source: Ambulatory Visit | Attending: Radiation Oncology | Admitting: Radiation Oncology

## 2021-06-04 DIAGNOSIS — Z510 Encounter for antineoplastic radiation therapy: Secondary | ICD-10-CM | POA: Diagnosis not present

## 2021-06-05 ENCOUNTER — Ambulatory Visit: Payer: Medicare Other

## 2021-06-08 ENCOUNTER — Ambulatory Visit: Payer: Medicare Other

## 2021-06-09 ENCOUNTER — Ambulatory Visit: Payer: Medicare Other

## 2021-06-10 ENCOUNTER — Inpatient Hospital Stay: Payer: Medicare Other | Attending: Oncology | Admitting: Oncology

## 2021-06-10 ENCOUNTER — Inpatient Hospital Stay: Payer: Medicare Other

## 2021-06-10 ENCOUNTER — Other Ambulatory Visit: Payer: Self-pay | Admitting: *Deleted

## 2021-06-10 ENCOUNTER — Encounter: Payer: Self-pay | Admitting: Oncology

## 2021-06-10 ENCOUNTER — Other Ambulatory Visit: Payer: Self-pay

## 2021-06-10 VITALS — BP 111/84 | HR 78 | Temp 98.0°F | Resp 16 | Ht 63.0 in | Wt 242.4 lb

## 2021-06-10 DIAGNOSIS — Z170 Estrogen receptor positive status [ER+]: Secondary | ICD-10-CM | POA: Insufficient documentation

## 2021-06-10 DIAGNOSIS — Z5181 Encounter for therapeutic drug level monitoring: Secondary | ICD-10-CM | POA: Diagnosis not present

## 2021-06-10 DIAGNOSIS — Z79811 Long term (current) use of aromatase inhibitors: Secondary | ICD-10-CM | POA: Diagnosis not present

## 2021-06-10 DIAGNOSIS — D0512 Intraductal carcinoma in situ of left breast: Secondary | ICD-10-CM

## 2021-06-10 MED ORDER — SILVER SULFADIAZINE 1 % EX CREA: 1.0000 "application " | TOPICAL_CREAM | Freq: Two times a day (BID) | CUTANEOUS | 2 refills | Status: DC

## 2021-06-10 NOTE — Progress Notes (Signed)
Hematology/Oncology Consult note [facility]  Telephone:([phone] [phone]  Patient Care Team: Wenda Low, MD as PCP - General (Internal Medicine) Mauro Kaufmann, RN as Oncology Nurse Navigator Rockwell Germany, RN as Oncology Nurse Navigator Jeanella Anton, NP as Nurse Practitioner (Nurse Practitioner)   Name of the patient: Kelli Abbott  [id number]  10-Nov-1955   Date of visit: 06/10/21  Diagnosis-left breast DCIS  Chief complaint/ Reason for visit-routine follow-up of left breast DCIS on Arimidex  Heme/Onc history: Patient is a 65 year old female who was seen by Dr. Lindi Adie in [place] in July 2022 after her mammogram and biopsy was consistent with DCIS.  She was started on Arimidex at that time and subsequently saw Dr. Bary Castilla and underwentLumpectomy.  Her original mammogram in March 2022 showed broad area of calcifications 7.3 x 7.3 cm in the lower inner quadrant of the left breast.  Final pathology showed intermediate to high-grade DCIS with comedonecrosis anterior margin less than 2 mm and inferior unifocal margin positive.  She did not undergo reexcision and completed adjuvant radiation treatment and radiation boost to the positive margin.  Patient wishes to transfer her care over to [facility] and therefore seeing me at this time  Interval history-patient reports tolerating Arimidex well without any significant side effects.  She does however report significant discomfort over the skin of her left breast which has peeled off since radiation treatment.  She is currently using Acra for Neosporin as well as aloe vera gel but it continues to burn and cause discomfort.  She saw radiation oncology last week for this as well.  ECOG PS- 1 Pain scale- 0  Review of systems- Review of Systems  Constitutional:  Negative for chills, fever, malaise/fatigue and weight loss.  HENT:  Negative for congestion, ear discharge and nosebleeds.   Eyes:   Negative for blurred vision.  Respiratory:  Negative for cough, hemoptysis, sputum production, shortness of breath and wheezing.   Cardiovascular:  Negative for chest pain, palpitations, orthopnea and claudication.  Gastrointestinal:  Negative for abdominal pain, blood in stool, constipation, diarrhea, heartburn, melena, nausea and vomiting.  Genitourinary:  Negative for dysuria, flank pain, frequency, hematuria and urgency.  Musculoskeletal:  Negative for back pain, joint pain and myalgias.  Skin:  Negative for rash.  Neurological:  Negative for dizziness, tingling, focal weakness, seizures, weakness and headaches.  Endo/Heme/Allergies:  Does not bruise/bleed easily.  Psychiatric/Behavioral:  Negative for depression and suicidal ideas. The patient does not have insomnia.    Breast area: Burning and irritation since radiation treatment over the skin of left breast Allergies  Allergen Reactions   Bupropion Hcl     Contributing to memory loss     Past Medical History:  Diagnosis Date   Anemia    Arthritis    Osteoarthritis   Breast cancer ([place])    Breast lump    Breast nodule    hypoechoic in right breast   Bursitis of shoulder region    Cancer [facility])    breast   Family history of breast cancer 11/06/2020   Family history of pancreatic cancer 11/06/2020   Hypertension    Mass on back    Personal history of radiation therapy 2009/2010   PONV (postoperative nausea and vomiting)    Skin lesions, generalized      Past Surgical History:  Procedure Laterality Date   BREAST BIOPSY Right 04/29/2008   malignant   BREAST BIOPSY Right 11/24/2015   benign   BREAST  LUMPECTOMY Right 06/04/2008   BREAST LUMPECTOMY WITH NEEDLE LOCALIZATION Left 02/18/2021   Procedure: BREAST LUMPECTOMY WITH NEEDLE LOCALIZATION;  Surgeon: Robert Bellow, MD;  Location: [facility];  Service: General;  Laterality: Left;  combined case with Dr. Marla Roe   BREAST SURGERY  2009   R breast reexcision of  margins   HYSTEROSCOPY WITH D & C N/A 11/06/2014   Procedure: DILATATION AND CURETTAGE /HYSTEROSCOPY with resection and myomectomy;  Surgeon: Thurnell Lose, MD;  Location: [place] [facility];  Service: Gynecology;  Laterality: N/A;   MASS EXCISION N/A 01/17/2018   Procedure: EXCISION  OF UPPER BACK MASS AND SKIN TAGS;  Surgeon: Stark Klein, MD;  Location: [place];  Service: General;  Laterality: N/A;   TOTAL HIP ARTHROPLASTY  04/2020   right   UNILATERAL BREAST REDUCTION Left 02/18/2021   Procedure: LEFT ONCOPLASTIC BREAST REDUCTION;  Surgeon: Wallace Going, DO;  Location: [facility];  Service: Plastics;  Laterality: Left;    Social History   Socioeconomic History   Marital status: Legally Separated    Spouse name: Not on file   Number of children: Not on file   Years of education: Not on file   Highest education level: Not on file  Occupational History   Not on file  Tobacco Use   Smoking status: Former    Packs/day: 1.00    Years: 20.00    Pack years: 20.00    Types: Cigarettes    Quit date: 04/26/2008    Years since quitting: 13.1   Smokeless tobacco: Never  Vaping Use   Vaping Use: Never used  Substance and Sexual Activity   Alcohol use: Not Currently   Drug use: No   Sexual activity: Not Currently  Other Topics Concern   Not on file  Social History Narrative   Not on file   Social Determinants of Health   Financial Resource Strain: Not on file  Food Insecurity: Not on file  Transportation Needs: Not on file  Physical Activity: Not on file  Stress: Not on file  Social Connections: Not on file  Intimate Partner Violence: Not on file    Family History  Problem Relation Age of Onset   Hypertension Mother    Hypertension Father    Hypertension Brother    Breast cancer Paternal Aunt        dx before 49   Pancreatic cancer Paternal Grandfather        dx 63s     Current Outpatient Medications:    acetaminophen (TYLENOL) 650 MG CR tablet, Take 650 mg by mouth  every 8 (eight) hours as needed for pain., Disp: , Rfl:    anastrozole (ARIMIDEX) 1 MG tablet, Take 1 tablet (1 mg total) by mouth daily., Disp: 30 tablet, Rfl: 0   cholecalciferol (VITAMIN D) 1000 units tablet, Take 1,000 Units by mouth daily., Disp: , Rfl:    ferrous sulfate 325 (65 FE) MG tablet, Take 325 mg by mouth daily with breakfast., Disp: , Rfl:    HYDROcodone-acetaminophen (NORCO) 10-325 MG tablet, Take 1-2 tablets by mouth every 6 (six) hours as needed for moderate pain or severe pain., Disp: 56 tablet, Rfl: 0   irbesartan-hydrochlorothiazide (AVALIDE) 300-12.5 MG tablet, Take 1 tablet by mouth daily., Disp: , Rfl:    olopatadine (PATANOL) 0.1 % ophthalmic solution, Place 1 drop into both eyes 2 (two) times daily as needed for allergies., Disp: , Rfl:    OZEMPIC, 0.25 OR 0.5 MG/DOSE, 2 MG/1.5ML SOPN, Inject 0.25  mg into the skin every Thursday., Disp: , Rfl:    phentermine (ADIPEX-P) 37.5 MG tablet, Take 37.5 mg by mouth daily before breakfast., Disp: , Rfl:    solifenacin (VESICARE) 10 MG tablet, Take 10 mg by mouth daily., Disp: , Rfl:    silver sulfADIAZINE (SILVADENE) 1 % cream, Apply 1 application topically 2 (two) times daily., Disp: 85 g, Rfl: 2  Physical exam:  Vitals:   06/10/21 1119  BP: 111/84  Pulse: 78  Resp: 16  Temp: 98 F (36.7 C)  TempSrc: Tympanic  Weight: 242 lb 6.4 oz (110 kg)  Height: 5\' 3"  (1.6 m)   Physical Exam Constitutional:      General: She is not in acute distress. Cardiovascular:     Rate and Rhythm: Normal rate and regular rhythm.     Heart sounds: Normal heart sounds.  Pulmonary:     Effort: Pulmonary effort is normal.     Breath sounds: Normal breath sounds.  Abdominal:     General: Bowel sounds are normal.     Palpations: Abdomen is soft.  Skin:    General: Skin is warm and dry.  Neurological:     Mental Status: She is alert and oriented to person, place, and time.    Breast exam: Patient is s/p left lumpectomy with a well-healed  surgical scar.  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  CMP Latest Ref Rng & Units 01/05/2018  Glucose 65 - 99 mg/dL 94  BUN 6 - 20 mg/dL 8  Creatinine 0.44 - 1.00 mg/dL 0.84  Sodium 135 - 145 mmol/L 140  Potassium 3.5 - 5.1 mmol/L 4.0  Chloride 101 - 111 mmol/L 105  CO2 22 - 32 mmol/L 28  Calcium 8.9 - 10.3 mg/dL 10.2  Total Protein 6.4 - 8.3 g/dL -  Total Bilirubin 0.20 - 1.20 mg/dL -  Alkaline Phos 40 - 150 U/L -  AST 5 - 34 U/L -  ALT 0 - 55 U/L -   CBC Latest Ref Rng & Units 05/19/2021  WBC 4.0 - 10.5 K/uL 9.0  Hemoglobin 12.0 - 15.0 g/dL 13.2  Hematocrit 36.0 - 46.0 % 41.1  Platelets 150 - 400 K/uL 259     Assessment and plan- Patient is a 65 y.o. female with left breast DCIS ER positive with unifocal positive margin s/p lumpectomy and adjuvant radiation treatment currently on Arimidex and this is a routine follow-up visit  Clinically patient is doing well and is tolerating Arimidex without any significant side effects.  I advised her to take over-the-counter calcium 1200 mg.  She is already on vitamin D.  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 Ductal carcinoma in situ (DCIS) of left breast   2. Visit for monitoring Arimidex therapy      Dr. Randa Evens, MD, MPH [facility] [phone] 06/10/2021 3:53 PM

## 2021-06-25 ENCOUNTER — Telehealth: Payer: Self-pay | Admitting: Genetic Counselor

## 2021-06-25 NOTE — Telephone Encounter (Signed)
Called patient per patient request in April to see if she wishes to proceed with genetic testing at this time.  LVM w/ contact info.

## 2021-07-01 ENCOUNTER — Other Ambulatory Visit: Payer: Self-pay

## 2021-07-01 ENCOUNTER — Inpatient Hospital Stay: Payer: Medicare Other | Attending: Oncology | Admitting: Hospice and Palliative Medicine

## 2021-07-01 DIAGNOSIS — D0512 Intraductal carcinoma in situ of left breast: Secondary | ICD-10-CM

## 2021-07-01 NOTE — Progress Notes (Signed)
Multidisciplinary Oncology Council Documentation  Kelli Abbott was presented by our [facility] on 07/01/2021, which included representatives from:  Palliative Care Dietitian  Physical/Occupational Therapist Nurse Navigator Genetics Speech Therapist Social work Survivorship RN Financial Navigator Research RN   Kelli Abbott currently presents with history of early stage breast cancer  We reviewed previous medical and familial history, history of present illness, and recent lab results along with all available histopathologic and imaging studies. The Kelli Abbott considered available treatment options and made the following recommendations/referrals:  Recommend rehab screening for lymphedema education  The MOC is a meeting of clinicians from various specialty areas who evaluate and discuss patients for whom a multidisciplinary approach is being considered. Final determinations in the plan of care are those of the provider(s).   Today's extended care, comprehensive team conference, Kelli Abbott was not present for the discussion and was not examined.

## 2021-07-02 ENCOUNTER — Other Ambulatory Visit: Payer: Self-pay | Admitting: Oncology

## 2021-07-02 ENCOUNTER — Encounter: Payer: Self-pay | Admitting: Radiation Oncology

## 2021-07-02 ENCOUNTER — Ambulatory Visit
Admission: RE | Admit: 2021-07-02 | Discharge: 2021-07-02 | Disposition: A | Discharge status: Home / Self Care | Payer: Medicare Other | Source: Ambulatory Visit | Attending: Oncology | Admitting: Oncology

## 2021-07-02 ENCOUNTER — Ambulatory Visit
Admission: RE | Admit: 2021-07-02 | Discharge: 2021-07-02 | Disposition: A | Discharge status: Home / Self Care | Payer: Medicare Other | Source: Ambulatory Visit | Attending: Radiation Oncology | Admitting: Radiation Oncology

## 2021-07-02 VITALS — BP 141/102 | HR 82 | Temp 96.4°F | Wt 249.6 lb

## 2021-07-02 DIAGNOSIS — M1990 Unspecified osteoarthritis, unspecified site: Secondary | ICD-10-CM | POA: Diagnosis not present

## 2021-07-02 DIAGNOSIS — D0512 Intraductal carcinoma in situ of left breast: Secondary | ICD-10-CM

## 2021-07-02 DIAGNOSIS — Z780 Asymptomatic menopausal state: Secondary | ICD-10-CM | POA: Insufficient documentation

## 2021-07-02 DIAGNOSIS — Z96643 Presence of artificial hip joint, bilateral: Secondary | ICD-10-CM | POA: Insufficient documentation

## 2021-07-02 DIAGNOSIS — Z79811 Long term (current) use of aromatase inhibitors: Secondary | ICD-10-CM | POA: Diagnosis not present

## 2021-07-02 DIAGNOSIS — Z853 Personal history of malignant neoplasm of breast: Secondary | ICD-10-CM | POA: Diagnosis not present

## 2021-07-02 DIAGNOSIS — Z13820 Encounter for screening for osteoporosis: Secondary | ICD-10-CM | POA: Diagnosis present

## 2021-07-02 DIAGNOSIS — Z923 Personal history of irradiation: Secondary | ICD-10-CM | POA: Insufficient documentation

## 2021-07-02 DIAGNOSIS — Z170 Estrogen receptor positive status [ER+]: Secondary | ICD-10-CM

## 2021-07-02 NOTE — Progress Notes (Signed)
Radiation Oncology Follow up Note  Name: Kelli Abbott   Date:   07/02/2021 MRN:  [id number] DOB: [birth year]    This 65 y.o. 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  REFERRING PROVIDER: Wenda Low, MD  HPI: Patient is a 65 year old female now at 1 month having completed whole breast radiation to her left breast status post wide local excision for ER positive ductal carcinoma in situ.  She is also status post whole breast radiation to her right breast in the past.  Seen today in routine follow-up she is still somewhat fatigued.  Although is really is not exercising at all.  She specifically denies breast tenderness cough or bone pain..  She has been started on Arimidex and is tolerating that well without side effect  COMPLICATIONS OF TREATMENT: none  FOLLOW UP COMPLIANCE: keeps appointments   PHYSICAL EXAM:  BP (!) 141/102   Pulse 82   Temp (!) 96.4 F (35.8 C)   Wt 249 lb 9.6 oz (113.2 kg)   BMI 44.21 kg/m  Lungs are clear to A&P cardiac examination essentially unremarkable with regular rate and rhythm. No dominant mass or nodularity is noted in either breast in 2 positions examined. Incision is well-healed. No axillary or supraclavicular adenopathy is appreciated. Cosmetic result is excellent.  All areas of prior desquamation have resolved.  Well-developed well-nourished patient in NAD. HEENT reveals PERLA, EOMI, discs not visualized.  Oral cavity is clear. No oral mucosal lesions are identified. Neck is clear without evidence of cervical or supraclavicular adenopathy. Lungs are clear to A&P. Cardiac examination is essentially unremarkable with regular rate and rhythm without murmur rub or thrill. Abdomen is benign with no organomegaly or masses noted. Motor sensory and DTR levels are equal and symmetric in the upper and lower extremities. Cranial nerves II through XII are  grossly intact. Proprioception is intact. No peripheral adenopathy or edema is identified. No motor or sensory levels are noted. Crude visual fields are within normal range.  RADIOLOGY RESULTS: No current films for review  PLAN: Present time patient is doing well 1 month out from whole breast radiation encouraged her to return to her swimming and exercise routine to help with her fatigue.  She continues to work.  She continues on Arimidex without side effect.  I have asked to see her back in 4 to 5 months for follow-up.  Patient knows to call with any concerns at any time.  I would like to take this opportunity to thank you for allowing me to participate in the care of your patient.Noreene Filbert, MD

## 2021-07-03 ENCOUNTER — Telehealth: Payer: Self-pay | Admitting: *Deleted

## 2021-07-03 NOTE — Telephone Encounter (Signed)
I contacted the patient to set up a referral for [place]. Rehab services explained to patient and purpose of the referral (lymphedema education). I explained to patient that she is at risk for developing lymphedema in her lifetime due to the removal/testing of lymph nodes for her breast cancer. Patient gave verbal understanding of the reason for the referral. She is currently not having any signs of lymphedema.  Patient stated that she is not interested in a referral at this time. She currently works every Wednesday and she is uncertain if she can make these apts. She will think about the option for the referral over the holiday season and get back with our office at a later time.  Patient thanked me for calling her.Nira Conn, RN

## 2021-07-13 ENCOUNTER — Other Ambulatory Visit: Payer: Self-pay | Admitting: Hematology and Oncology

## 2021-08-07 ENCOUNTER — Telehealth: Payer: Self-pay | Admitting: Oncology

## 2021-08-07 NOTE — Telephone Encounter (Signed)
Left VM with patient about the slight change in her appointment time on 10/09/21. Left direct extension for her to call back if there are any conflicts. Sending updated appointment reminder in the mail also.

## 2021-08-12 ENCOUNTER — Other Ambulatory Visit: Payer: Self-pay | Admitting: Hematology and Oncology

## 2021-08-18 ENCOUNTER — Other Ambulatory Visit: Payer: Self-pay | Admitting: Radiation Oncology

## 2021-08-28 ENCOUNTER — Telehealth: Payer: Self-pay | Admitting: Oncology

## 2021-08-28 NOTE — Telephone Encounter (Signed)
Pt called in upset. States that she don't know the NP, why is she being seen by the NP. Please give her a call before she cancels. Call back at [phone]

## 2021-09-04 ENCOUNTER — Encounter: Payer: Self-pay | Admitting: *Deleted

## 2021-09-08 ENCOUNTER — Other Ambulatory Visit: Payer: Self-pay | Admitting: Hematology and Oncology

## 2021-09-08 NOTE — Telephone Encounter (Signed)
Hi Dr. Janese Banks, this crossed over to Dr. Lindi Adie if you can please refill.

## 2021-09-28 ENCOUNTER — Encounter: Payer: Self-pay | Admitting: Licensed Clinical Social Worker

## 2021-09-28 ENCOUNTER — Other Ambulatory Visit: Payer: Self-pay | Admitting: *Deleted

## 2021-09-28 DIAGNOSIS — C50312 Malignant neoplasm of lower-inner quadrant of left female breast: Secondary | ICD-10-CM

## 2021-09-28 DIAGNOSIS — Z170 Estrogen receptor positive status [ER+]: Secondary | ICD-10-CM

## 2021-09-28 NOTE — Progress Notes (Signed)
[place] CSW Progress Note ? ?Clinical Social Worker contacted patient by phone to discuss patient concerns about living situation.  Patient is currently living in [address] and is concerned her neighbor maybe using drugs, and stated there is a foul smell coming form the neighbor's apartment.  Patent has contacted management, [place], the police and fire department.  Patient stated [employer] Housing dept.  Has assigned her to case worker Ms. Hassell Done.  Patient stated Ms. Hassell Done will contact Dr. Olena Leatherwood office for confirmation of patient treatment status, to assist with moving her to another Section 8 apartment.  CSW stated I would update RN about this.  Patient stated she did not have any other concerns or needs and would contact Dr. Olena Leatherwood office or CSW if there was anything else.  CSW verbalized understanding. ? ?CSW updated [facility] on conversation with patient via secure chat and email. ? ? ? ?Brita Jurgensen , LCSW ?

## 2021-09-29 ENCOUNTER — Other Ambulatory Visit: Payer: Self-pay | Admitting: *Deleted

## 2021-09-29 MED ORDER — ANASTROZOLE 1 MG PO TABS: 1.0000 mg | ORAL_TABLET | Freq: Every day | ORAL | 0 refills | Status: DC

## 2021-10-09 ENCOUNTER — Encounter: Payer: Self-pay | Admitting: Nurse Practitioner

## 2021-10-09 ENCOUNTER — Inpatient Hospital Stay: Payer: Medicare Other | Attending: Oncology | Admitting: Nurse Practitioner

## 2021-10-09 ENCOUNTER — Ambulatory Visit: Payer: Medicare Other | Admitting: Oncology

## 2021-10-09 ENCOUNTER — Other Ambulatory Visit: Payer: Self-pay

## 2021-10-09 VITALS — BP 123/88 | HR 100 | Temp 98.6°F | Resp 16 | Ht 63.0 in | Wt 249.0 lb

## 2021-10-09 DIAGNOSIS — N951 Menopausal and female climacteric states: Secondary | ICD-10-CM | POA: Insufficient documentation

## 2021-10-09 DIAGNOSIS — Z87891 Personal history of nicotine dependence: Secondary | ICD-10-CM | POA: Insufficient documentation

## 2021-10-09 DIAGNOSIS — Z5181 Encounter for therapeutic drug level monitoring: Secondary | ICD-10-CM | POA: Diagnosis not present

## 2021-10-09 DIAGNOSIS — Z803 Family history of malignant neoplasm of breast: Secondary | ICD-10-CM | POA: Diagnosis not present

## 2021-10-09 DIAGNOSIS — Z170 Estrogen receptor positive status [ER+]: Secondary | ICD-10-CM | POA: Diagnosis not present

## 2021-10-09 DIAGNOSIS — Z800 Family history of malignant neoplasm of digestive organs: Secondary | ICD-10-CM | POA: Diagnosis not present

## 2021-10-09 DIAGNOSIS — Z79811 Long term (current) use of aromatase inhibitors: Secondary | ICD-10-CM | POA: Diagnosis not present

## 2021-10-09 DIAGNOSIS — D0512 Intraductal carcinoma in situ of left breast: Secondary | ICD-10-CM | POA: Diagnosis present

## 2021-10-09 NOTE — Progress Notes (Signed)
? ?Hematology/Oncology Consult Note ?[facility]  ?[phone] [phone] ? ?Patient Care Team: ?Carylon Perches, NP as PCP - General (Family Medicine) ?Mauro Kaufmann, RN as Oncology Nurse Navigator ?Rockwell Germany, RN as Oncology Nurse Navigator ?Jeanella Anton, NP as Nurse Practitioner (Nurse Practitioner)  ? ?Name of the patient: Kelli Abbott  ?[id number]  ?[birth year]  ? ?Date of visit: 10/09/21 ? ?Diagnosis-left breast DCIS ? ?Chief complaint/ Reason for visit-routine follow-up of left breast DCIS on Arimidex ? ?Heme/Onc history: Patient is a 67 year old female who was seen by Dr. Lindi Adie in [place] in July 2022 after her mammogram and biopsy was consistent with DCIS.  She was started on Arimidex at that time and subsequently saw Dr. Bary Castilla and underwentLumpectomy.  Her original mammogram in March 2022 showed broad area of calcifications 7.3 x 7.3 cm in the lower inner quadrant of the left breast.  Final pathology showed intermediate to high-grade DCIS with comedonecrosis anterior margin less than 2 mm and inferior unifocal margin positive.  She did not undergo reexcision and completed adjuvant radiation treatment and radiation boost to the positive margin.  Patient wishes to transfer her care over to [facility] and therefore seeing me at this time ? ?Interval history-patient is 66 year old female with above history of DCIS, currently on Arimidex, who returns to clinic for follow-up.  She continues to tolerate Arimidex, complains of hot flashes particularly at night.  ? ?ECOG PS- 1 ?Pain scale- 0 ? ?Review of systems- Review of Systems  ?Constitutional:  Positive for malaise/fatigue. Negative for chills, fever and weight loss.  ?HENT:  Negative for congestion, ear discharge and nosebleeds.   ?Eyes:  Negative for blurred vision.  ?Respiratory:  Negative for cough, hemoptysis, sputum production, shortness of breath and wheezing.   ?Cardiovascular:  Negative for chest  pain, palpitations, orthopnea and claudication.  ?Gastrointestinal:  Negative for abdominal pain, blood in stool, constipation, diarrhea, heartburn, melena, nausea and vomiting.  ?Genitourinary:  Negative for dysuria, flank pain, frequency, hematuria and urgency.  ?Musculoskeletal:  Negative for back pain, joint pain and myalgias.  ?Skin:  Negative for rash.  ?Neurological:  Negative for dizziness, tingling, focal weakness, seizures, weakness and headaches.  ?Endo/Heme/Allergies:  Does not bruise/bleed easily.  ?Psychiatric/Behavioral:  Negative for depression and suicidal ideas. The patient does not have insomnia.    ?Breast area: no complaints. ?Allergies  ?Allergen Reactions  ? Bupropion Hcl   ?  Contributing to memory loss  ? ? ? ?Past Medical History:  ?Diagnosis Date  ? Anemia   ? Arthritis   ? Osteoarthritis  ? Breast cancer ([place])   ? Breast lump   ? Breast nodule   ? hypoechoic in right breast  ? Bursitis of shoulder region   ? Cancer [facility])   ? breast  ? Family history of breast cancer 11/06/2020  ? Family history of pancreatic cancer 11/06/2020  ? Hypertension   ? Mass on back   ? Personal history of radiation therapy 2009/2010  ? PONV (postoperative nausea and vomiting)   ? Skin lesions, generalized   ? ? ? ?Past Surgical History:  ?Procedure Laterality Date  ? BREAST BIOPSY Right 04/29/2008  ? malignant  ? BREAST BIOPSY Right 11/24/2015  ? benign  ? BREAST LUMPECTOMY Right 06/04/2008  ? BREAST LUMPECTOMY WITH NEEDLE LOCALIZATION Left 02/18/2021  ? Procedure: BREAST LUMPECTOMY WITH NEEDLE LOCALIZATION;  Surgeon: Robert Bellow, MD;  Location: [facility] ORS;  Service: General;  Laterality: Left;  combined case with Dr. Marla Roe  ?  BREAST SURGERY  2009  ? R breast reexcision of margins  ? HYSTEROSCOPY WITH D & C N/A 11/06/2014  ? Procedure: DILATATION AND CURETTAGE /HYSTEROSCOPY with resection and myomectomy;  Surgeon: Thurnell Lose, MD;  Location: [facility];  Service: Gynecology;  Laterality: N/A;  ? MASS  EXCISION N/A 01/17/2018  ? Procedure: EXCISION  OF UPPER BACK MASS AND SKIN TAGS;  Surgeon: Stark Klein, MD;  Location: [facility];  Service: General;  Laterality: N/A;  ? TOTAL HIP ARTHROPLASTY  04/2020  ? right  ? UNILATERAL BREAST REDUCTION Left 02/18/2021  ? Procedure: LEFT ONCOPLASTIC BREAST REDUCTION;  Surgeon: Wallace Going, DO;  Location: [facility] ORS;  Service: Plastics;  Laterality: Left;  ? ? ?Social History  ? ?Socioeconomic History  ? Marital status: Legally Separated  ?  Spouse name: Not on file  ? Number of children: Not on file  ? Years of education: Not on file  ? Highest education level: Not on file  ?Occupational History  ? Not on file  ?Tobacco Use  ? Smoking status: Former  ?  Packs/day: 1.00  ?  Years: 20.00  ?  Pack years: 20.00  ?  Types: Cigarettes  ?  Quit date: 04/26/2008  ?  Years since quitting: 13.4  ? Smokeless tobacco: Never  ?Vaping Use  ? Vaping Use: Never used  ?Substance and Sexual Activity  ? Alcohol use: Not Currently  ? Drug use: No  ? Sexual activity: Not Currently  ?Other Topics Concern  ? Not on file  ?Social History Narrative  ? Not on file  ? ?Social Determinants of Health  ? ?Financial Resource Strain: Not on file  ?Food Insecurity: Not on file  ?Transportation Needs: Not on file  ?Physical Activity: Not on file  ?Stress: Not on file  ?Social Connections: Not on file  ?Intimate Partner Violence: Not on file  ? ? ?Family History  ?Problem Relation Age of Onset  ? Hypertension Mother   ? Hypertension Father   ? Hypertension Brother   ? Breast cancer Paternal Aunt   ?     dx before 36  ? Pancreatic cancer Paternal Grandfather   ?     dx 71s  ? ? ? ?Current Outpatient Medications:  ?  acetaminophen (TYLENOL) 650 MG CR tablet, Take 650 mg by mouth every 8 (eight) hours as needed for pain., Disp: , Rfl:  ?  anastrozole (ARIMIDEX) 1 MG tablet, Take 1 tablet (1 mg total) by mouth daily., Disp: 90 tablet, Rfl: 0 ?  cholecalciferol (VITAMIN D) 1000 units tablet, Take 1,000 Units by  mouth daily., Disp: , Rfl:  ?  ferrous sulfate 325 (65 FE) MG tablet, Take 325 mg by mouth daily with breakfast., Disp: , Rfl:  ?  HYDROcodone-acetaminophen (NORCO) 10-325 MG tablet, Take 1-2 tablets by mouth every 6 (six) hours as needed for moderate pain or severe pain., Disp: 56 tablet, Rfl: 0 ?  irbesartan-hydrochlorothiazide (AVALIDE) 300-12.5 MG tablet, Take 1 tablet by mouth daily., Disp: , Rfl:  ?  olopatadine (PATANOL) 0.1 % ophthalmic solution, Place 1 drop into both eyes 2 (two) times daily as needed for allergies., Disp: , Rfl:  ?  OZEMPIC, 0.25 OR 0.5 MG/DOSE, 2 MG/1.5ML SOPN, Inject 0.25 mg into the skin every Thursday., Disp: , Rfl:  ?  phentermine (ADIPEX-P) 37.5 MG tablet, Take 37.5 mg by mouth daily before breakfast., Disp: , Rfl:  ?  silver sulfADIAZINE (SILVADENE) 1 % cream, Apply 1 application topically 2 (two) times  daily., Disp: 85 g, Rfl: 2 ?  solifenacin (VESICARE) 10 MG tablet, Take 10 mg by mouth daily., Disp: , Rfl:  ? ?Physical exam:  ?Vitals:  ? 10/09/21 1519  ?BP: 123/88  ?Pulse: 100  ?Resp: 16  ?Temp: 98.6 ?F (37 ?C)  ?TempSrc: Tympanic  ?SpO2: 99%  ?Weight: 249 lb (112.9 kg)  ?Height: '5\' 3"'$  (1.6 m)  ? ?Physical Exam ?Constitutional:   ?   General: She is not in acute distress. ?Cardiovascular:  ?   Rate and Rhythm: Normal rate and regular rhythm.  ?   Heart sounds: Normal heart sounds.  ?Pulmonary:  ?   Effort: Pulmonary effort is normal.  ?   Breath sounds: Normal breath sounds.  ?Abdominal:  ?   General: Bowel sounds are normal.  ?   Palpations: Abdomen is soft.  ?Skin: ?   General: Skin is warm and dry.  ?Neurological:  ?   Mental Status: She is alert and oriented to person, place, and time.  ?Breast exam: derferred. ? ?CMP Latest Ref Rng & Units 01/05/2018  ?Glucose 65 - 99 mg/dL 94  ?BUN 6 - 20 mg/dL 8  ?Creatinine 0.44 - 1.00 mg/dL 0.84  ?Sodium 135 - 145 mmol/L 140  ?Potassium 3.5 - 5.1 mmol/L 4.0  ?Chloride 101 - 111 mmol/L 105  ?CO2 22 - 32 mmol/L 28  ?Calcium 8.9 - 10.3  mg/dL 10.2  ?Total Protein 6.4 - 8.3 g/dL -  ?Total Bilirubin 0.20 - 1.20 mg/dL -  ?Alkaline Phos 40 - 150 U/L -  ?AST 5 - 34 U/L -  ?ALT 0 - 55 U/L -  ? ?CBC Latest Ref Rng & Units 05/19/2021  ?WBC 4.0 - 10.5 K

## 2021-10-19 ENCOUNTER — Other Ambulatory Visit: Payer: Self-pay | Admitting: General Surgery

## 2021-10-19 NOTE — Progress Notes (Signed)
Subjective:  ?  ? Patient ID: Kelli Abbott is a 66 y.o. female. ?  ?HPI ?  ?The following portions of the patient's history were reviewed and updated as appropriate. ?  ?This an established patient is here today for: office visit. Here for follow up left breast cancer. She denies any new breast issues, still a little tender at times. ?She does admit to having night sweats with the Arimidex, but states the Oncologist plans on switching her. ?  ?The patient reports she had been advised by her primary physician that she should not resume water aerobic program. ?  ?Review of Systems  ?Constitutional: Negative for chills and fever.  ?Respiratory: Negative for cough.   ?  ?  ?   ?Chief Complaint  ?Patient presents with  ? Follow-up  ?  ?  ?BP 119/84   Pulse 92   Temp 37.3 ?C (99.1 ?F)   Ht 160 cm ('5\' 3"'$ )   Wt (!) 112.9 kg (249 lb)   BMI 44.11 kg/m?  ?  ?    ?Past Medical History:  ?Diagnosis Date  ? Anemia    ? Arthritis    ? Breast cancer ([facility])    ? Bursitis of shoulder region    ? Chronic back pain    ? Ductal carcinoma in situ (DCIS) of left breast with comedonecrosis 02/18/2021  ?  5.5 cm area of intermediate/high grade DCIS, 2 margins microscopically positive, 2 margins less than 2 mm.  ER/PR 95%.Decision to defer re-excision.  ? Hypertension    ? Personal history of radiation therapy    ? PONV (postoperative nausea and vomiting)    ? Skin lesions, generalized    ?  ?  ?     ?Past Surgical History:  ?Procedure Laterality Date  ? breast re-excision of margins Right 2009  ? BREAST EXCISIONAL BIOPSY Right 04/29/2008  ? MASTECTOMY PARTIAL / LUMPECTOMY Right 06/04/2008  ? hysteroscopy with d and c   11/06/2014  ? BREAST EXCISIONAL BIOPSY Right 11/24/2015  ? excision mass upper back and skin tags   01/17/2018  ?  Stark Klein, MD, [facility].  Pathology reports a 4 x 7.3 x 10.6 cm focally well-defined soft, lobulated fatty mass consistent with lipoma.  ? ARTHROPLASTY HIP TOTAL Right 04/2020  ? MASTECTOMY PARTIAL  / LUMPECTOMY Left 02/18/2021  ?  with left reduction mastoplasty  ? COLONOSCOPY      ?  ?  ?  ?        ?OB History   ?  Gravida  ?1  ? Para  ?1  ? Term  ?   ? Preterm  ?   ? AB  ?   ? Living  ?   ?  ?  SAB  ?   ? IAB  ?   ? Ectopic  ?   ? Molar  ?   ? Multiple  ?   ? Live Births  ?   ?  ?  ?  Obstetric Comments  ?Age at first period 69 ?Age of first pregnancy 58 ?   ?  ?   ?  ?  ?Social History  ?  ?  ?     ?Socioeconomic History  ? Marital status: Widowed  ?Tobacco Use  ? Smoking status: Former  ?    Packs/day: 1.00  ?    Years: 20.00  ?    Pack years: 20.00  ?    Types: Cigarettes  ?  Quit date: 04/26/2008  ?    Years since quitting: 13.4  ? Smokeless tobacco: Never  ?Substance and Sexual Activity  ? Alcohol use: Not Currently  ? Drug use: Never  ?  ?  ?  ?No Known Allergies ?  ?Current Medications  ?      ?Current Outpatient Medications  ?Medication Sig Dispense Refill  ? acetaminophen (TYLENOL) 650 MG ER tablet Take 650 mg by mouth every 8 (eight) hours as needed      ? anastrozole (ARIMIDEX) 1 mg tablet anastrozole 1 mg tablet ? Take 1 tablet (1 mg total) by mouth daily.      ? calcium carbonate (CALCIUM 600 ORAL) Take 2 tablets by mouth once daily      ? cholecalciferol (VITAMIN D3) 1000 unit tablet Take by mouth      ? ferrous sulfate 325 (65 FE) MG tablet Take by mouth      ? HYDROcodone-acetaminophen (NORCO) 10-325 mg tablet hydrocodone 10 mg-acetaminophen 325 mg tablet ? 1 (one) Tablet by mouth four times daily, as needed      ? ibuprofen (MOTRIN) 800 MG tablet ibuprofen 800 mg tablet ? Take 1 tablet by mouth daily      ? irbesartan-hydrochlorothiazide (AVALIDE) 300-12.5 mg tablet irbesartan 300 mg-hydrochlorothiazide 12.5 mg tablet ? 1 tablet Once a day Orally 90      ? semaglutide (OZEMPIC) 0.25 mg or 0.5 mg(2 mg/1.5 mL) pen injector Ozempic 0.25 mg or 0.5 mg (2 mg/1.5 mL) subcutaneous pen injector ? 1 (one) Solution Pen-injector Solution Pen-injector Give 0.'25mg'$  under the skin once weekly      ?  solifenacin (VESICARE) 10 MG tablet solifenacin 10 mg tablet ? 1 tablet Orally Once a day 90 days      ?  ?No current facility-administered medications for this visit.  ?  ?  ?  ?     ?Family History  ?Problem Relation Age of Onset  ? High blood pressure (Hypertension) Mother    ? High blood pressure (Hypertension) Father    ? High blood pressure (Hypertension) Brother    ? Breast cancer Paternal Aunt    ? Pancreatic cancer Paternal Grandfather    ? Colon cancer Cousin    ?  ?  ?  ?Labs and Radiology:  ?  ?Operative note and pathology findings on January 17, 2018 related to original excision of the right back lipoma were reviewed. ?  ?  ?   ?Objective:  ? Physical Exam ?Exam conducted with a chaperone present.  ?Constitutional:   ?   Appearance: Normal appearance.  ?Cardiovascular:  ?   Rate and Rhythm: Normal rate and regular rhythm.  ?   Pulses: Normal pulses.  ?   Heart sounds: Normal heart sounds.  ?Pulmonary:  ?   Effort: Pulmonary effort is normal.  ?   Breath sounds: Normal breath sounds.  ? ? ?   Comments: Scar in right upper quadrant overlying the scapula from prior lipoma excision.  Presently 13 x 17 cm soft tissue mass.  No fixation to the deep tissue. ?Chest:  ?Breasts: ?   Right: Normal.  ?   Left: Normal.  ? ? ?   Comments: Improvement in hyperpigmented skin in the upper outer quadrant of the left breast.  Excellent wound healing from the mastopexy incision. ?Musculoskeletal:  ?   Cervical back: Neck supple.  ?Lymphadenopathy:  ?   Upper Body:  ?   Right upper body: No supraclavicular or axillary adenopathy.  ?  Left upper body: No supraclavicular or axillary adenopathy.  ?Skin: ?   General: Skin is warm and dry.  ?Neurological:  ?   Mental Status: She is alert and oriented to person, place, and time.  ?Psychiatric:     ?   Mood and Affect: Mood normal.     ?   Behavior: Behavior normal.  ?  ?  ?  ?   ?Assessment:  ?   ?Doing well 8 months status post excision of a large area of DCIS from the left  breast. ?  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 ?   ?Plan:  ?   ?The patient is doing well in regards to her breast arrangements be made for bilateral diagnostic mammograms in summer. ?  ?With the recurrence of the right posterior shoulder lipoma excision is appropriate. ?  ?No contraindication to return to water aerobics. ?  ?The patient reports her PCP had discouraged/forbidden her from returning to water aerobics.  She is anxious to return to this program, has no wounds at risk and is encouraged to do so. ?  ?She will be contacted to arrange for excision of the recurrent back lipoma at a convenient time under general anesthesia. ?   ?  ?This note is partially prepared by Karie Fetch, RN, acting as a scribe in the presence of Dr. Hervey Ard, MD.  ?The documentation recorded by the scribe accurately reflects the service I personally performed and the decisions made by me.  ?  ?Robert Bellow, MD FACS ?

## 2021-10-22 ENCOUNTER — Other Ambulatory Visit: Payer: Self-pay

## 2021-10-22 ENCOUNTER — Encounter
Admission: RE | Admit: 2021-10-22 | Discharge: 2021-10-22 | Disposition: A | Discharge status: Home / Self Care | Payer: Medicare Other | Source: Ambulatory Visit | Attending: General Surgery | Admitting: General Surgery

## 2021-10-22 VITALS — Ht 63.0 in | Wt 249.0 lb

## 2021-10-22 DIAGNOSIS — D508 Other iron deficiency anemias: Secondary | ICD-10-CM

## 2021-10-22 DIAGNOSIS — Z79899 Other long term (current) drug therapy: Secondary | ICD-10-CM

## 2021-10-22 NOTE — Patient Instructions (Signed)
Your procedure is scheduled on: 10/30/21 Report to [place]. To find out your arrival time please call [phone] between 1PM - 3PM on 10/29/21.  Remember: Instructions that are not followed completely may result in serious medical risk, up to and including death, or upon the discretion of your surgeon and anesthesiologist your surgery may need to be rescheduled.     _X__ 1. Do not eat food after midnight the night before your procedure.                 No gum chewing or hard candies. You may drink clear liquids up to 2 hours                 before you are scheduled to arrive for your surgery- DO not drink clear                 liquids within 2 hours of the start of your surgery.                 Clear Liquids include:  water, apple juice without pulp, clear carbohydrate                 drink such as Clearfast or Gatorade, Black Coffee or Tea (Do not add                 anything to coffee or tea). Diabetics water only  __X__2.  On the morning of surgery brush your teeth with toothpaste and water, you                 may rinse your mouth with mouthwash if you wish.  Do not swallow any              toothpaste of mouthwash.     _X__ 3.  No Alcohol for 24 hours before or after surgery.   _X__ 4.  Do Not Smoke or use e-cigarettes For 24 Hours Prior to Your Surgery.                 Do not use any chewable tobacco products for at least 6 hours prior to                 surgery.  ____  5.  Bring all medications with you on the day of surgery if instructed.   __X__  6.  Notify your doctor if there is any change in your medical condition      (cold, fever, infections).     Do not wear jewelry, make-up, hairpins, clips or nail polish. Do not wear lotions, powders, or perfumes. NO DEODORANT Do not shave body hair 48 hours prior to surgery. Men may shave face and neck. Do not bring valuables to the hospital.    [facility] is not responsible  for any belongings or valuables.  Contacts, dentures/partials or body piercings may not be worn into surgery. Bring a case for your contacts, glasses or hearing aids, a denture cup will be supplied. Leave your suitcase in the car. After surgery it may be brought to your room. For patients admitted to the hospital, discharge time is determined by your treatment team.   Patients discharged the day of surgery will not be allowed to drive home.   Please read over the following fact sheets that you were given:   CHG soap  __X__ Take these medicines the morning of surgery with A SIP OF  WATER:    1. anastrozole (ARIMIDEX) 1 MG tablet  2. solifenacin (VESICARE) 10 MG tablet  3.   4.  5.  6.  ____ Fleet Enema (as directed)   __X__ Use CHG Soap/SAGE wipes as directed  ____ Use inhalers on the day of surgery  ____ Stop metformin/Janumet/Farxiga 2 days prior to surgery    ____ Take 1/2 of usual insulin dose the night before surgery. No insulin the morning          of surgery.   ____ Stop Blood Thinners Coumadin/Plavix/Xarelto/Pleta/Pradaxa/Eliquis/Effient/Aspirin  on   Or contact your Surgeon, Cardiologist or Medical Doctor regarding  ability to stop your blood thinners  __X__ Stop Anti-inflammatories 7 days before surgery such as Advil, Ibuprofen, Motrin,  BC or Goodies Powder, Naprosyn, Naproxen, Aleve, Aspirin    __X__ Stop all herbals and supplements, fish oil or vitamins for 7 days until after surgery.    ____ Bring C-Pap to the hospital.

## 2021-10-23 ENCOUNTER — Ambulatory Visit
Admission: RE | Admit: 2021-10-23 | Discharge: 2021-10-23 | Disposition: A | Discharge status: Home / Self Care | Payer: Medicare Other | Source: Ambulatory Visit | Attending: Nurse Practitioner | Admitting: Nurse Practitioner

## 2021-10-23 ENCOUNTER — Other Ambulatory Visit: Admission: RE | Admit: 2021-10-23 | Payer: Medicare Other | Source: Ambulatory Visit

## 2021-10-23 DIAGNOSIS — D0512 Intraductal carcinoma in situ of left breast: Secondary | ICD-10-CM | POA: Diagnosis not present

## 2021-10-28 ENCOUNTER — Encounter (HOSPITAL_COMMUNITY): Payer: Self-pay | Admitting: Urgent Care

## 2021-10-29 ENCOUNTER — Telehealth: Payer: Self-pay | Admitting: *Deleted

## 2021-10-29 NOTE — Telephone Encounter (Signed)
Patient called reporting that she is upset that NP L Zenia Resides did not document something regarding her "ongoing situation" and so she is still living in that "situation" as a result. She wants Dr Janese Banks to know that nothing was documented to get her housing situation expedited. She is requesting a return call to discuss this matter. ?

## 2021-10-29 NOTE — Telephone Encounter (Signed)
So I guess we have done what we can to help her. Kelli Abbott/ Kelli Abbott- can one of you reach out to her and close the loop? Thank you

## 2021-10-29 NOTE — Telephone Encounter (Signed)
Patient called asking about changing her medicine to something else as discussed at her appointment due to it causing hot flashes and not being able to sleep well at night. Plesae advise  ?

## 2021-10-29 NOTE — Telephone Encounter (Signed)
Josh can you see how we can help her?social work referral?  I only see her for dcis.

## 2021-10-29 NOTE — Telephone Encounter (Signed)
I spoke to Kelli Abbott and she states that she can only write that pt is breast cancer patient and she is on oral medication for her cancer treatment. It was faxed back to [facility] at [phone] on 10/21/2021 ?

## 2021-10-30 ENCOUNTER — Other Ambulatory Visit: Payer: Self-pay | Admitting: *Deleted

## 2021-10-30 ENCOUNTER — Telehealth: Payer: Self-pay | Admitting: *Deleted

## 2021-10-30 ENCOUNTER — Ambulatory Visit: Admit: 2021-10-30 | Payer: Medicare Other | Admitting: General Surgery

## 2021-10-30 SURGERY — EXCISION LIPOMA
Anesthesia: General | Laterality: Right

## 2021-10-30 MED ORDER — LETROZOLE 2.5 MG PO TABS: 2.5000 mg | ORAL_TABLET | Freq: Every day | ORAL | 3 refills | Status: DC

## 2021-10-30 NOTE — Telephone Encounter (Signed)
Pt having hot flashes with arimidex and wanted another med to try. Spoke to [employer] and see what  med to order and she sent in letrozole to her pharmacy. Pt called me right back and I let her know about the change. ?

## 2021-10-30 NOTE — Telephone Encounter (Signed)
Yes can you call her

## 2021-11-30 ENCOUNTER — Ambulatory Visit: Payer: Medicare Other | Admitting: Radiation Oncology

## 2021-12-07 ENCOUNTER — Ambulatory Visit
Admission: RE | Admit: 2021-12-07 | Discharge: 2021-12-07 | Disposition: A | Discharge status: Home / Self Care | Payer: Medicare Other | Source: Ambulatory Visit | Attending: Radiation Oncology | Admitting: Radiation Oncology

## 2021-12-07 ENCOUNTER — Encounter: Payer: Self-pay | Admitting: Radiation Oncology

## 2021-12-07 VITALS — BP 147/97 | HR 78 | Temp 97.3°F | Resp 18 | Ht 63.0 in | Wt 247.4 lb

## 2021-12-07 DIAGNOSIS — D0512 Intraductal carcinoma in situ of left breast: Secondary | ICD-10-CM | POA: Insufficient documentation

## 2021-12-07 DIAGNOSIS — Z170 Estrogen receptor positive status [ER+]: Secondary | ICD-10-CM | POA: Diagnosis not present

## 2021-12-07 DIAGNOSIS — Z79811 Long term (current) use of aromatase inhibitors: Secondary | ICD-10-CM | POA: Diagnosis not present

## 2021-12-07 DIAGNOSIS — Z923 Personal history of irradiation: Secondary | ICD-10-CM | POA: Diagnosis not present

## 2021-12-07 NOTE — Progress Notes (Signed)
Radiation Oncology ?Follow up Note ? ?Name: Kelli Abbott   ?Date:   12/07/2021 ?MRN:  [id number] ?DOB: 01/26/1956  ? ? ?This 66 y.o. 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 ? ?REFERRING PROVIDER: HWenda Low MD ? 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  Seen today in routine follow-up she is doing well.  Specifically denies any breast tenderness cough or bone pain..  She had a mammogram back in March which I have reviewed was BI-RADS 2 benign.  She is currently on Femara tolerating it well without side effect. ? ?COMPLICATIONS OF TREATMENT: none ? ?FOLLOW UP COMPLIANCE: keeps appointments  ? ?PHYSICAL EXAM:  ?BP (!) 147/97   Pulse 78   Temp (!) 97.3 ?F (36.3 ?C)   Resp 18   Ht '5\' 3"'$  (1.6 m)   Wt 247 lb 6.4 oz (112.2 kg)   BMI 43.82 kg/m?  ?Lungs are clear to A&P cardiac examination essentially unremarkable with regular rate and rhythm. No dominant mass or nodularity is noted in either breast in 2 positions examined. Incision is well-healed. No axillary or supraclavicular adenopathy is appreciated. Cosmetic result is excellent.  Slight area of probable calcified blood in her seroma in her lumpectomy scar.  Well-developed well-nourished patient in NAD. HEENT reveals PERLA, EOMI, discs not visualized.  Oral cavity is clear. No oral mucosal lesions are identified. Neck is clear without evidence of cervical or supraclavicular adenopathy. Lungs are clear to A&P. Cardiac examination is essentially unremarkable with regular rate and rhythm without murmur rub or thrill. Abdomen is benign with no organomegaly or masses noted. Motor sensory and DTR levels are equal and symmetric in the upper and lower extremities. Cranial nerves II through XII are grossly intact. Proprioception is intact. No  peripheral adenopathy or edema is identified. No motor or sensory levels are noted. Crude visual fields are within normal range. ? ?RADIOLOGY RESULTS: Mammograms reviewed compatible with above-stated findings ? ?PLAN: Present time patient is doing well 5 months out from whole breast radiation and pleased with her overall progress have asked to see her back in 6 months and then will start once year follow-up visits.  She continues on Femara without side effect.  Patient is to call with any concerns. ? ?I would like to take this opportunity to thank you for allowing me to participate in the care of your patient.. ?  ? GNoreene Filbert MD ? ?

## 2022-01-05 ENCOUNTER — Other Ambulatory Visit: Payer: Self-pay | Admitting: Oncology

## 2022-01-11 ENCOUNTER — Inpatient Hospital Stay (HOSPITAL_BASED_OUTPATIENT_CLINIC_OR_DEPARTMENT_OTHER): Payer: Medicare Other | Admitting: Oncology

## 2022-01-11 ENCOUNTER — Other Ambulatory Visit: Payer: Self-pay

## 2022-01-11 ENCOUNTER — Inpatient Hospital Stay: Payer: Medicare Other | Attending: Oncology

## 2022-01-11 ENCOUNTER — Encounter: Payer: Self-pay | Admitting: Oncology

## 2022-01-11 VITALS — BP 155/122 | HR 92 | Temp 97.5°F | Resp 16 | Ht 63.0 in | Wt 244.5 lb

## 2022-01-11 DIAGNOSIS — D0512 Intraductal carcinoma in situ of left breast: Secondary | ICD-10-CM

## 2022-01-11 DIAGNOSIS — Z170 Estrogen receptor positive status [ER+]: Secondary | ICD-10-CM | POA: Diagnosis not present

## 2022-01-11 DIAGNOSIS — C50411 Malignant neoplasm of upper-outer quadrant of right female breast: Secondary | ICD-10-CM

## 2022-01-11 DIAGNOSIS — Z87891 Personal history of nicotine dependence: Secondary | ICD-10-CM | POA: Insufficient documentation

## 2022-01-11 DIAGNOSIS — Z79811 Long term (current) use of aromatase inhibitors: Secondary | ICD-10-CM | POA: Diagnosis not present

## 2022-01-11 LAB — CBC
HCT: 43.6 % (ref 36.0–46.0)
Hemoglobin: 14.1 g/dL (ref 12.0–15.0)
MCH: 28.7 pg (ref 26.0–34.0)
MCHC: 32.3 g/dL (ref 30.0–36.0)
MCV: 88.8 fL (ref 80.0–100.0)
Platelets: 297 10*3/uL (ref 150–400)
RBC: 4.91 MIL/uL (ref 3.87–5.11)
RDW: 15.2 % (ref 11.5–15.5)
WBC: 10.2 10*3/uL (ref 4.0–10.5)
nRBC: 0 % (ref 0.0–0.2)

## 2022-01-11 MED ORDER — EXEMESTANE 25 MG PO TABS: 25.0000 mg | ORAL_TABLET | Freq: Every day | ORAL | 3 refills | Status: DC

## 2022-01-11 NOTE — Progress Notes (Signed)
Hematology/Oncology Consult note [facility]  Telephone:([phone] [phone]  Patient Care Team: Carylon Perches, NP as PCP - General (Family Medicine) Mauro Kaufmann, RN as Oncology Nurse Navigator Rockwell Germany, RN as Oncology Nurse Navigator Jeanella Anton, NP as Nurse Practitioner (Nurse Practitioner)   Name of the patient: Kelli Abbott  [id number]  1955-12-09   Date of visit: 01/11/22  Diagnosis-left breast DCIS ER positive  Chief complaint/ Reason for visit-routine follow-up of DCIS on letrozole  Heme/Onc history: Patient is a 66 year old female who was seen by Dr. Lindi Adie in [place] in July 2022 after her mammogram and biopsy was consistent with DCIS.  She was started on Arimidex at that time and subsequently saw Dr. Bary Castilla and underwentLumpectomy.  Her original mammogram in March 2022 showed broad area of calcifications 7.3 x 7.3 cm in the lower inner quadrant of the left breast.  Final pathology showed intermediate to high-grade DCIS with comedonecrosis anterior margin less than 2 mm and inferior unifocal margin positive.  She did not undergo reexcision and completed adjuvant radiation treatment and radiation boost to the positive margin.  Patient was subsequently switched over from Arimidex to letrozole as she had side effects from Arimidex  Interval history-patient currently reports intolerable hot flashes especially at night.  Also reports itching of her chest wall mainly at night.  Feels like her memory is not the same over the last year or so.  Denies other complaints at this time  ECOG PS- 1 Pain scale- 0   Review of systems- Review of Systems  Constitutional:  Negative for chills, fever, malaise/fatigue and weight loss.  HENT:  Negative for congestion, ear discharge and nosebleeds.   Eyes:  Negative for blurred vision.  Respiratory:  Negative for cough, hemoptysis, sputum production, shortness of breath and wheezing.    Cardiovascular:  Negative for chest pain, palpitations, orthopnea and claudication.  Gastrointestinal:  Negative for abdominal pain, blood in stool, constipation, diarrhea, heartburn, melena, nausea and vomiting.  Genitourinary:  Negative for dysuria, flank pain, frequency, hematuria and urgency.  Musculoskeletal:  Negative for back pain, joint pain and myalgias.  Skin:  Negative for rash.  Neurological:  Negative for dizziness, tingling, focal weakness, seizures, weakness and headaches.  Endo/Heme/Allergies:  Does not bruise/bleed easily.       Hot flashes  Psychiatric/Behavioral:  Negative for depression and suicidal ideas. The patient does not have insomnia.       Allergies  Allergen Reactions   Bupropion Hcl     Contributing to memory loss     Past Medical History:  Diagnosis Date   Anemia    Arthritis    Osteoarthritis   Breast cancer ([place])    Breast lump    Breast nodule    hypoechoic in right breast   Bursitis of shoulder region    Cancer [facility])    breast   Family history of breast cancer 11/06/2020   Family history of pancreatic cancer 11/06/2020   Hypertension    Mass on back    Personal history of radiation therapy 2009/2010   PONV (postoperative nausea and vomiting)    Skin lesions, generalized      Past Surgical History:  Procedure Laterality Date   BREAST BIOPSY Right 04/29/2008   malignant   BREAST BIOPSY Right 11/24/2015   benign   BREAST LUMPECTOMY Right 06/04/2008   BREAST LUMPECTOMY WITH NEEDLE LOCALIZATION Left 02/18/2021   Procedure: BREAST LUMPECTOMY WITH NEEDLE LOCALIZATION;  Surgeon: Hervey Ard  W, MD;  Location: [facility] ORS;  Service: General;  Laterality: Left;  combined case with Dr. Marla Roe   BREAST SURGERY  2009   R breast reexcision of margins   HYSTEROSCOPY WITH D & C N/A 11/06/2014   Procedure: DILATATION AND CURETTAGE /HYSTEROSCOPY with resection and myomectomy;  Surgeon: Thurnell Lose, MD;  Location: [place] [facility];  Service:  Gynecology;  Laterality: N/A;   MASS EXCISION N/A 01/17/2018   Procedure: EXCISION  OF UPPER BACK MASS AND SKIN TAGS;  Surgeon: Stark Klein, MD;  Location: [place];  Service: General;  Laterality: N/A;   TOTAL HIP ARTHROPLASTY Bilateral 04/2020   right   UNILATERAL BREAST REDUCTION Left 02/18/2021   Procedure: LEFT ONCOPLASTIC BREAST REDUCTION;  Surgeon: Wallace Going, DO;  Location: [facility] ORS;  Service: Plastics;  Laterality: Left;    Social History   Socioeconomic History   Marital status: Legally Separated    Spouse name: Not on file   Number of children: Not on file   Years of education: Not on file   Highest education level: Not on file  Occupational History   Not on file  Tobacco Use   Smoking status: Former    Packs/day: 1.00    Years: 20.00    Total pack years: 20.00    Types: Cigarettes    Quit date: 04/26/2008    Years since quitting: 13.7   Smokeless tobacco: Never  Vaping Use   Vaping Use: Never used  Substance and Sexual Activity   Alcohol use: Not Currently   Drug use: No   Sexual activity: Not Currently  Other Topics Concern   Not on file  Social History Narrative   Not on file   Social Determinants of Health   Financial Resource Strain: Not on file  Food Insecurity: Not on file  Transportation Needs: Not on file  Physical Activity: Not on file  Stress: Not on file  Social Connections: Not on file  Intimate Partner Violence: Not on file    Family History  Problem Relation Age of Onset   Hypertension Mother    Hypertension Father    Hypertension Brother    Breast cancer Paternal Aunt        dx before 55   Pancreatic cancer Paternal Grandfather        dx 27s     Current Outpatient Medications:    acetaminophen (TYLENOL) 650 MG CR tablet, Take 650 mg by mouth every 8 (eight) hours as needed for pain., Disp: , Rfl:    cholecalciferol (VITAMIN D) 1000 units tablet, Take 1,000 Units by mouth daily., Disp: , Rfl:    exemestane (AROMASIN)  25 MG tablet, Take 1 tablet (25 mg total) by mouth daily after breakfast., Disp: 30 tablet, Rfl: 3   ferrous sulfate 325 (65 FE) MG tablet, Take 325 mg by mouth daily with breakfast., Disp: , Rfl:    HYDROcodone-acetaminophen (NORCO) 10-325 MG tablet, Take 1-2 tablets by mouth every 6 (six) hours as needed for moderate pain or severe pain., Disp: 56 tablet, Rfl: 0   irbesartan-hydrochlorothiazide (AVALIDE) 300-12.5 MG tablet, Take 1 tablet by mouth daily., Disp: , Rfl:    olopatadine (PATANOL) 0.1 % ophthalmic solution, Place 1 drop into both eyes 2 (two) times daily as needed for allergies., Disp: , Rfl:    OZEMPIC, 0.25 OR 0.5 MG/DOSE, 2 MG/1.5ML SOPN, Inject 0.5 mg into the skin every Thursday., Disp: , Rfl:    phentermine (ADIPEX-P) 37.5 MG tablet, Take 37.5 mg by  mouth daily before breakfast., Disp: , Rfl:    solifenacin (VESICARE) 10 MG tablet, Take 10 mg by mouth daily., Disp: , Rfl:   Physical exam:  Vitals:   01/11/22 1356  BP: (!) 155/122  Pulse: 92  Resp: 16  Temp: (!) 97.5 F (36.4 C)  TempSrc: Tympanic  SpO2: 99%  Weight: 244 lb 8 oz (110.9 kg)  Height: '5\' 3"'$  (1.6 m)   Physical Exam Constitutional:      General: She is not in acute distress. Cardiovascular:     Rate and Rhythm: Normal rate and regular rhythm.     Heart sounds: Normal heart sounds.  Pulmonary:     Effort: Pulmonary effort is normal.     Breath sounds: Normal breath sounds.  Abdominal:     General: Bowel sounds are normal.     Palpations: Abdomen is soft.  Skin:    General: Skin is warm and dry.  Neurological:     Mental Status: She is alert and oriented to person, place, and time.    Breast exam was performed in seated and lying down position. Patient is status post left lumpectomy with a well-healed surgical scar. No evidence of any palpable masses. No evidence of axillary adenopathy. No evidence of any palpable masses or lumps in the right breast. No evidence of right axillary adenopathy       Latest Ref Rng & Units 01/05/2018   10:12 AM  CMP  Glucose 65 - 99 mg/dL 94   BUN 6 - 20 mg/dL 8   Creatinine 0.44 - 1.00 mg/dL 0.84   Sodium 135 - 145 mmol/L 140   Potassium 3.5 - 5.1 mmol/L 4.0   Chloride 101 - 111 mmol/L 105   CO2 22 - 32 mmol/L 28   Calcium 8.9 - 10.3 mg/dL 10.2       Latest Ref Rng & Units 01/11/2022    1:46 PM  CBC  WBC 4.0 - 10.5 K/uL 10.2   Hemoglobin 12.0 - 15.0 g/dL 14.1   Hematocrit 36.0 - 46.0 % 43.6   Platelets 150 - 400 K/uL 297      Assessment and plan- Patient is a 66 y.o. female with history of left breast DCIS ER positive currently on letrozole here for routine follow-up  Patient was initially on Arimidex and then switched over to letrozole when she had side effects.  Presently patient reports significant hot flashes especially at night as well as pruritus.  Hot flashes may be a side effect of letrozole.  Options at this time include treatment of her hot flashes with medications like gabapentin versus switching her to an alternative agent such as Aromasin.  I have asked her to come off letrozole for 2 weeks and we will send her a new prescription for Aromasin.  I will see her back in 3 months no labs to see how her symptoms are doing   Visit Diagnosis 1. Ductal carcinoma in situ (DCIS) of left breast   2. Malignant neoplasm of upper-outer quadrant of right breast in female, estrogen receptor positive ([place])      Dr. Randa Evens, MD, MPH [facility] at [facility] [phone] 01/11/2022 3:40 PM

## 2022-01-12 ENCOUNTER — Other Ambulatory Visit: Payer: Self-pay | Admitting: Oncology

## 2022-01-12 DIAGNOSIS — C50411 Malignant neoplasm of upper-outer quadrant of right female breast: Secondary | ICD-10-CM

## 2022-01-25 ENCOUNTER — Other Ambulatory Visit: Payer: Self-pay | Admitting: Oncology

## 2022-03-18 ENCOUNTER — Other Ambulatory Visit: Payer: Self-pay | Admitting: Oncology

## 2022-04-12 ENCOUNTER — Encounter: Payer: Self-pay | Admitting: Oncology

## 2022-04-12 ENCOUNTER — Inpatient Hospital Stay: Payer: Medicare Other | Attending: Oncology | Admitting: Oncology

## 2022-04-12 VITALS — BP 123/81 | HR 75 | Temp 96.7°F | Resp 20 | Wt 251.2 lb

## 2022-04-12 DIAGNOSIS — Z79811 Long term (current) use of aromatase inhibitors: Secondary | ICD-10-CM | POA: Diagnosis not present

## 2022-04-12 DIAGNOSIS — D0512 Intraductal carcinoma in situ of left breast: Secondary | ICD-10-CM | POA: Diagnosis present

## 2022-04-12 DIAGNOSIS — Z87891 Personal history of nicotine dependence: Secondary | ICD-10-CM | POA: Insufficient documentation

## 2022-04-12 NOTE — Progress Notes (Signed)
Pt states she still experience shooting pain in her left breash and itchy every now and then; somewhat irritated. Has not started water aerobics back due to sxs.

## 2022-04-12 NOTE — Progress Notes (Signed)
Hematology/Oncology Consult note [facility]  Telephone:([phone] [phone]  Patient Care Team: Carylon Perches, NP as PCP - General (Family Medicine) Mauro Kaufmann, RN as Oncology Nurse Navigator Rockwell Germany, RN as Oncology Nurse Navigator Jeanella Anton, NP as Nurse Practitioner (Nurse Practitioner)   Name of the patient: Kelli Abbott  [id number]  [birth year]   Date of visit: 04/12/22  Diagnosis-left breast DCIS ER positive  Chief complaint/ Reason for visit-routine follow-up of DCIS on letrozole  Heme/Onc history: Patient is a 66 year old female who was seen by Dr. Lindi Adie in [place] in July 2022 after her mammogram and biopsy was consistent with DCIS.  She was started on Arimidex at that time and subsequently saw Dr. Bary Castilla and underwentLumpectomy.  Her original mammogram in March 2022 showed broad area of calcifications 7.3 x 7.3 cm in the lower inner quadrant of the left breast.  Final pathology showed intermediate to high-grade DCIS with comedonecrosis anterior margin less than 2 mm and inferior unifocal margin positive.  She did not undergo reexcision and completed adjuvant radiation treatment and radiation boost to the positive margin.  Patient was subsequently switched over from Arimidex to letrozole as she had side effects from Arimidex  Interval history-reports tolerating letrozole well without any significant side effects.  Denies any breast concerns.  She has chronic shooting pains in her left breast where she underwent mastopexy.  ECOG PS- 1 Pain scale- 0  Review of systems- Review of Systems  Constitutional:  Negative for chills, fever, malaise/fatigue and weight loss.  HENT:  Negative for congestion, ear discharge and nosebleeds.   Eyes:  Negative for blurred vision.  Respiratory:  Negative for cough, hemoptysis, sputum production, shortness of breath and wheezing.   Cardiovascular:  Negative for chest pain,  palpitations, orthopnea and claudication.  Gastrointestinal:  Negative for abdominal pain, blood in stool, constipation, diarrhea, heartburn, melena, nausea and vomiting.  Genitourinary:  Negative for dysuria, flank pain, frequency, hematuria and urgency.  Musculoskeletal:  Negative for back pain, joint pain and myalgias.  Skin:  Negative for rash.  Neurological:  Negative for dizziness, tingling, focal weakness, seizures, weakness and headaches.  Endo/Heme/Allergies:  Does not bruise/bleed easily.  Psychiatric/Behavioral:  Negative for depression and suicidal ideas. The patient does not have insomnia.       Allergies  Allergen Reactions   Bupropion Hcl     Contributing to memory loss     Past Medical History:  Diagnosis Date   Anemia    Arthritis    Osteoarthritis   Breast cancer ([place])    Breast lump    Breast nodule    hypoechoic in right breast   Bursitis of shoulder region    Cancer [facility])    breast   Family history of breast cancer 11/06/2020   Family history of pancreatic cancer 11/06/2020   Hypertension    Mass on back    Personal history of radiation therapy 2009/2010   PONV (postoperative nausea and vomiting)    Skin lesions, generalized      Past Surgical History:  Procedure Laterality Date   BREAST BIOPSY Right 04/29/2008   malignant   BREAST BIOPSY Right 11/24/2015   benign   BREAST LUMPECTOMY Right 06/04/2008   BREAST LUMPECTOMY WITH NEEDLE LOCALIZATION Left 02/18/2021   Procedure: BREAST LUMPECTOMY WITH NEEDLE LOCALIZATION;  Surgeon: Robert Bellow, MD;  Location: [facility];  Service: General;  Laterality: Left;  combined case with Dr. Marla Roe   BREAST SURGERY  2009   R breast reexcision of margins   HYSTEROSCOPY WITH D & C N/A 11/06/2014   Procedure: DILATATION AND CURETTAGE /HYSTEROSCOPY with resection and myomectomy;  Surgeon: Thurnell Lose, MD;  Location: [place] [facility];  Service: Gynecology;  Laterality: N/A;   MASS EXCISION N/A 01/17/2018    Procedure: EXCISION  OF UPPER BACK MASS AND SKIN TAGS;  Surgeon: Stark Klein, MD;  Location: [place];  Service: General;  Laterality: N/A;   TOTAL HIP ARTHROPLASTY Bilateral 04/2020   right   UNILATERAL BREAST REDUCTION Left 02/18/2021   Procedure: LEFT ONCOPLASTIC BREAST REDUCTION;  Surgeon: Wallace Going, DO;  Location: [facility];  Service: Plastics;  Laterality: Left;    Social History   Socioeconomic History   Marital status: Legally Separated    Spouse name: Not on file   Number of children: Not on file   Years of education: Not on file   Highest education level: Not on file  Occupational History   Not on file  Tobacco Use   Smoking status: Former    Packs/day: 1.00    Years: 20.00    Total pack years: 20.00    Types: Cigarettes    Quit date: 04/26/2008    Years since quitting: 13.9   Smokeless tobacco: Never  Vaping Use   Vaping Use: Never used  Substance and Sexual Activity   Alcohol use: Not Currently   Drug use: No   Sexual activity: Not Currently  Other Topics Concern   Not on file  Social History Narrative   Not on file   Social Determinants of Health   Financial Resource Strain: Not on file  Food Insecurity: Not on file  Transportation Needs: Not on file  Physical Activity: Not on file  Stress: Not on file  Social Connections: Not on file  Intimate Partner Violence: Not on file    Family History  Problem Relation Age of Onset   Hypertension Mother    Hypertension Father    Hypertension Brother    Breast cancer Paternal Aunt        dx before 11   Pancreatic cancer Paternal Grandfather        dx 21s     Current Outpatient Medications:    acetaminophen (TYLENOL) 650 MG CR tablet, Take 650 mg by mouth every 8 (eight) hours as needed for pain., Disp: , Rfl:    anastrozole (ARIMIDEX) 1 MG tablet, Take 1 tablet by mouth daily., Disp: , Rfl:    calcium carbonate (OS-CAL) 1250 (500 Ca) MG chewable tablet, Chew 1 tablet by mouth daily., Disp: ,  Rfl:    cholecalciferol (VITAMIN D) 1000 units tablet, Take 1,000 Units by mouth daily., Disp: , Rfl:    ferrous sulfate 325 (65 FE) MG tablet, Take 325 mg by mouth daily with breakfast., Disp: , Rfl:    HYDROcodone-acetaminophen (NORCO) 10-325 MG tablet, Take 1-2 tablets by mouth every 6 (six) hours as needed for moderate pain or severe pain., Disp: 56 tablet, Rfl: 0   irbesartan-hydrochlorothiazide (AVALIDE) 300-12.5 MG tablet, Take 1 tablet by mouth daily., Disp: , Rfl:    olopatadine (PATANOL) 0.1 % ophthalmic solution, Place 1 drop into both eyes 2 (two) times daily as needed for allergies., Disp: , Rfl:    OZEMPIC, 0.25 OR 0.5 MG/DOSE, 2 MG/1.5ML SOPN, Inject 0.5 mg into the skin every Thursday., Disp: , Rfl:    solifenacin (VESICARE) 10 MG tablet, Take 10 mg by mouth daily., Disp: , Rfl:  buPROPion (WELLBUTRIN SR) 200 MG 12 hr tablet, Take 1 tablet by mouth 2 (two) times daily. (Patient not taking: Reported on 04/12/2022), Disp: , Rfl:    exemestane (AROMASIN) 25 MG tablet, Take 1 tablet (25 mg total) by mouth daily after breakfast. (Patient not taking: Reported on 04/12/2022), Disp: 30 tablet, Rfl: 3   letrozole (FEMARA) 2.5 MG tablet, Take 1 tablet (2.5 mg total) by mouth daily. (Patient not taking: Reported on 04/12/2022), Disp: 30 tablet, Rfl: 3   phentermine (ADIPEX-P) 37.5 MG tablet, Take 37.5 mg by mouth daily before breakfast. (Patient not taking: Reported on 04/12/2022), Disp: , Rfl:   Physical exam:  Vitals:   04/12/22 1006  BP: 123/81  Pulse: 75  Resp: 20  Temp: (!) 96.7 F (35.9 C)  SpO2: 98%  Weight: 251 lb 3.2 oz (113.9 kg)   Physical Exam Constitutional:      General: She is not in acute distress. Cardiovascular:     Rate and Rhythm: Normal rate and regular rhythm.     Heart sounds: Normal heart sounds.  Pulmonary:     Effort: Pulmonary effort is normal.     Breath sounds: Normal breath sounds.  Abdominal:     General: Bowel sounds are normal.     Palpations:  Abdomen is soft.  Skin:    General: Skin is warm and dry.  Neurological:     Mental Status: She is alert and oriented to person, place, and time.   Breast exam was performed in seated and lying down position. Patient is status post right lumpectomy with a well-healed surgical scar. No evidence of any palpable masses. No evidence of axillary adenopathy. No evidence of any palpable masses or lumps in the left breast. No evidence of leftt axillary adenopathy. Scar of mastopexy on the left breast      Latest Ref Rng & Units 01/05/2018   10:12 AM  CMP  Glucose 65 - 99 mg/dL 94   BUN 6 - 20 mg/dL 8   Creatinine 0.44 - 1.00 mg/dL 0.84   Sodium 135 - 145 mmol/L 140   Potassium 3.5 - 5.1 mmol/L 4.0   Chloride 101 - 111 mmol/L 105   CO2 22 - 32 mmol/L 28   Calcium 8.9 - 10.3 mg/dL 10.2       Latest Ref Rng & Units 01/11/2022    1:46 PM  CBC  WBC 4.0 - 10.5 K/uL 10.2   Hemoglobin 12.0 - 15.0 g/dL 14.1   Hematocrit 36.0 - 46.0 % 43.6   Platelets 150 - 400 K/uL 297      Assessment and plan- Patient is a 66 y.o. female with history of right breast DCIS on letrozole here for routine follow-up  Clinically patient is doing well with no concerning signs and symptoms of recurrence based on today's exam.  She is tolerating letrozole well without any significant side effects.  She would be due for a diagnostic mammogram in March 2024 which I will schedule.  I will see her back in 6 months no labs   Visit Diagnosis 1. Ductal carcinoma in situ (DCIS) of left breast   2. Use of letrozole ([facility])      Dr. Randa Evens, MD, MPH [facility] at [facility] [phone] 04/12/2022 1:03 PM

## 2022-04-29 DIAGNOSIS — Z9181 History of falling: Secondary | ICD-10-CM | POA: Diagnosis not present

## 2022-04-29 DIAGNOSIS — C50811 Malignant neoplasm of overlapping sites of right female breast: Secondary | ICD-10-CM | POA: Diagnosis not present

## 2022-04-29 DIAGNOSIS — G894 Chronic pain syndrome: Secondary | ICD-10-CM | POA: Diagnosis not present

## 2022-04-29 DIAGNOSIS — M169 Osteoarthritis of hip, unspecified: Secondary | ICD-10-CM | POA: Diagnosis not present

## 2022-04-29 DIAGNOSIS — R030 Elevated blood-pressure reading, without diagnosis of hypertension: Secondary | ICD-10-CM | POA: Diagnosis not present

## 2022-05-31 DIAGNOSIS — M169 Osteoarthritis of hip, unspecified: Secondary | ICD-10-CM | POA: Diagnosis not present

## 2022-05-31 DIAGNOSIS — C50811 Malignant neoplasm of overlapping sites of right female breast: Secondary | ICD-10-CM | POA: Diagnosis not present

## 2022-05-31 DIAGNOSIS — Z9181 History of falling: Secondary | ICD-10-CM | POA: Diagnosis not present

## 2022-05-31 DIAGNOSIS — R030 Elevated blood-pressure reading, without diagnosis of hypertension: Secondary | ICD-10-CM | POA: Diagnosis not present

## 2022-05-31 DIAGNOSIS — Z79899 Other long term (current) drug therapy: Secondary | ICD-10-CM | POA: Diagnosis not present

## 2022-05-31 DIAGNOSIS — G894 Chronic pain syndrome: Secondary | ICD-10-CM | POA: Diagnosis not present

## 2022-05-31 DIAGNOSIS — F32A Depression, unspecified: Secondary | ICD-10-CM | POA: Diagnosis not present

## 2022-06-07 ENCOUNTER — Ambulatory Visit
Admission: RE | Admit: 2022-06-07 | Discharge: 2022-06-07 | Disposition: A | Discharge status: Home / Self Care | Payer: Medicare Other | Source: Ambulatory Visit | Attending: Radiation Oncology | Admitting: Radiation Oncology

## 2022-06-07 ENCOUNTER — Other Ambulatory Visit: Payer: Self-pay | Admitting: Oncology

## 2022-06-07 ENCOUNTER — Encounter: Payer: Self-pay | Admitting: Radiation Oncology

## 2022-06-07 VITALS — BP 134/91 | HR 76 | Temp 78.0°F | Resp 16 | Ht 63.0 in | Wt 250.0 lb

## 2022-06-07 DIAGNOSIS — Z79811 Long term (current) use of aromatase inhibitors: Secondary | ICD-10-CM | POA: Diagnosis not present

## 2022-06-07 DIAGNOSIS — D0511 Intraductal carcinoma in situ of right breast: Secondary | ICD-10-CM | POA: Diagnosis not present

## 2022-06-07 DIAGNOSIS — Z170 Estrogen receptor positive status [ER+]: Secondary | ICD-10-CM | POA: Diagnosis not present

## 2022-06-07 DIAGNOSIS — D0512 Intraductal carcinoma in situ of left breast: Secondary | ICD-10-CM | POA: Insufficient documentation

## 2022-06-07 DIAGNOSIS — Z923 Personal history of irradiation: Secondary | ICD-10-CM | POA: Insufficient documentation

## 2022-06-07 NOTE — Progress Notes (Signed)
Radiation Oncology Follow up Note  Name: Kelli Abbott   Date:   06/07/2022 MRN:  [id number] DOB: 1956-06-05    This 66 y.o. female presents to the clinic today for 1 year follow-up status post whole breast radiation to her right breast for ER/PR positive ductal carcinoma in situ.  REFERRING PROVIDER: Carylon Perches, NP  HPI: Patient is a 66 year old female now out 1 year having completed radiation therapy to her right breast for ER positive ductal carcinoma in situ.  She is seen today in routine follow-up and is doing well has not yet had a mammogram..  Since March of last year.  Which I have reviewed.  She still has an area of apparent hardening of blood pool in her seroma site which is unchanged.  She otherwise specifically denies breast tenderness cough or bone pain.  She is currently on Arimidex tolerating it well without side effect.  COMPLICATIONS OF TREATMENT: none  FOLLOW UP COMPLIANCE: keeps appointments   PHYSICAL EXAM:  BP (!) 134/91   Pulse 76   Temp (!) 78 F (25.6 C)   Resp 16   Ht '5\' 3"'$  (1.6 m)   Wt 250 lb (113.4 kg)   BMI 44.29 kg/m  Small stable area of hardening in the area of her seroma.  This is consistent with calcified blood.  No other dominant masses noted in either breast.  No axillary or supraclavicular adenopathy is identified.  Well-developed well-nourished patient in NAD. HEENT reveals PERLA, EOMI, discs not visualized.  Oral cavity is clear. No oral mucosal lesions are identified. Neck is clear without evidence of cervical or supraclavicular adenopathy. Lungs are clear to A&P. Cardiac examination is essentially unremarkable with regular rate and rhythm without murmur rub or thrill. Abdomen is benign with no organomegaly or masses noted. Motor sensory and DTR levels are equal and symmetric in the upper and lower extremities. Cranial nerves II through XII are grossly intact. Proprioception is intact. No peripheral adenopathy or edema is identified. No motor or  sensory levels are noted. Crude visual fields are within normal range.  RADIOLOGY RESULTS: Mammograms from March reviewed  PLAN: Present time patient is doing well with no evidence of disease now out 1 year.  And pleased with her overall progress.  I have asked to see her back in 1 year for follow-up.  Follow-up mammograms in April have already been ordered.  Patient is to call with any concerns.  I would like to take this opportunity to thank you for allowing me to participate in the care of your patient.Noreene Filbert, MD

## 2022-06-28 DIAGNOSIS — F32A Depression, unspecified: Secondary | ICD-10-CM | POA: Diagnosis not present

## 2022-06-28 DIAGNOSIS — C50811 Malignant neoplasm of overlapping sites of right female breast: Secondary | ICD-10-CM | POA: Diagnosis not present

## 2022-06-28 DIAGNOSIS — M169 Osteoarthritis of hip, unspecified: Secondary | ICD-10-CM | POA: Diagnosis not present

## 2022-06-28 DIAGNOSIS — Z79899 Other long term (current) drug therapy: Secondary | ICD-10-CM | POA: Diagnosis not present

## 2022-06-28 DIAGNOSIS — G894 Chronic pain syndrome: Secondary | ICD-10-CM | POA: Diagnosis not present

## 2022-06-28 DIAGNOSIS — Z9181 History of falling: Secondary | ICD-10-CM | POA: Diagnosis not present

## 2022-06-28 DIAGNOSIS — R030 Elevated blood-pressure reading, without diagnosis of hypertension: Secondary | ICD-10-CM | POA: Diagnosis not present

## 2022-07-08 DIAGNOSIS — Z9189 Other specified personal risk factors, not elsewhere classified: Secondary | ICD-10-CM | POA: Diagnosis not present

## 2022-07-13 ENCOUNTER — Ambulatory Visit: Payer: Medicare Other | Admitting: Oncology

## 2022-07-29 DIAGNOSIS — C50811 Malignant neoplasm of overlapping sites of right female breast: Secondary | ICD-10-CM | POA: Diagnosis not present

## 2022-07-29 DIAGNOSIS — R030 Elevated blood-pressure reading, without diagnosis of hypertension: Secondary | ICD-10-CM | POA: Diagnosis not present

## 2022-07-29 DIAGNOSIS — G894 Chronic pain syndrome: Secondary | ICD-10-CM | POA: Diagnosis not present

## 2022-07-29 DIAGNOSIS — Z79899 Other long term (current) drug therapy: Secondary | ICD-10-CM | POA: Diagnosis not present

## 2022-07-29 DIAGNOSIS — Z9181 History of falling: Secondary | ICD-10-CM | POA: Diagnosis not present

## 2022-07-29 DIAGNOSIS — M169 Osteoarthritis of hip, unspecified: Secondary | ICD-10-CM | POA: Diagnosis not present

## 2022-08-26 ENCOUNTER — Ambulatory Visit (HOSPITAL_COMMUNITY): Payer: 59 | Admitting: Psychiatry

## 2022-08-26 DIAGNOSIS — C50811 Malignant neoplasm of overlapping sites of right female breast: Secondary | ICD-10-CM | POA: Diagnosis not present

## 2022-08-26 DIAGNOSIS — R030 Elevated blood-pressure reading, without diagnosis of hypertension: Secondary | ICD-10-CM | POA: Diagnosis not present

## 2022-08-26 DIAGNOSIS — M169 Osteoarthritis of hip, unspecified: Secondary | ICD-10-CM | POA: Diagnosis not present

## 2022-08-26 DIAGNOSIS — Z9181 History of falling: Secondary | ICD-10-CM | POA: Diagnosis not present

## 2022-08-26 DIAGNOSIS — Z79899 Other long term (current) drug therapy: Secondary | ICD-10-CM | POA: Diagnosis not present

## 2022-09-24 DIAGNOSIS — Z79899 Other long term (current) drug therapy: Secondary | ICD-10-CM | POA: Diagnosis not present

## 2022-09-24 DIAGNOSIS — M169 Osteoarthritis of hip, unspecified: Secondary | ICD-10-CM | POA: Diagnosis not present

## 2022-09-24 DIAGNOSIS — Z9181 History of falling: Secondary | ICD-10-CM | POA: Diagnosis not present

## 2022-09-24 DIAGNOSIS — R030 Elevated blood-pressure reading, without diagnosis of hypertension: Secondary | ICD-10-CM | POA: Diagnosis not present

## 2022-09-24 DIAGNOSIS — C50811 Malignant neoplasm of overlapping sites of right female breast: Secondary | ICD-10-CM | POA: Diagnosis not present

## 2022-10-04 ENCOUNTER — Other Ambulatory Visit: Payer: Self-pay | Admitting: Oncology

## 2022-10-11 ENCOUNTER — Ambulatory Visit: Payer: Medicare Other | Admitting: Oncology

## 2022-10-14 ENCOUNTER — Encounter (HOSPITAL_COMMUNITY): Payer: Self-pay | Admitting: Psychiatry

## 2022-10-14 ENCOUNTER — Ambulatory Visit (HOSPITAL_BASED_OUTPATIENT_CLINIC_OR_DEPARTMENT_OTHER): Payer: 59 | Admitting: Psychiatry

## 2022-10-14 VITALS — BP 132/90 | HR 87 | Ht 63.0 in | Wt 247.0 lb

## 2022-10-14 DIAGNOSIS — F419 Anxiety disorder, unspecified: Secondary | ICD-10-CM | POA: Diagnosis not present

## 2022-10-14 DIAGNOSIS — F4310 Post-traumatic stress disorder, unspecified: Secondary | ICD-10-CM

## 2022-10-14 MED ORDER — DULOXETINE HCL 20 MG PO CPEP: 20.0000 mg | ORAL_CAPSULE | Freq: Every day | ORAL | 0 refills | Status: DC
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
## 2022-10-25 DIAGNOSIS — M169 Osteoarthritis of hip, unspecified: Secondary | ICD-10-CM | POA: Diagnosis not present

## 2022-10-25 DIAGNOSIS — C50811 Malignant neoplasm of overlapping sites of right female breast: Secondary | ICD-10-CM | POA: Diagnosis not present

## 2022-10-25 DIAGNOSIS — Z9181 History of falling: Secondary | ICD-10-CM | POA: Diagnosis not present

## 2022-10-25 DIAGNOSIS — Z79899 Other long term (current) drug therapy: Secondary | ICD-10-CM | POA: Diagnosis not present

## 2022-10-25 DIAGNOSIS — R030 Elevated blood-pressure reading, without diagnosis of hypertension: Secondary | ICD-10-CM | POA: Diagnosis not present

## 2022-10-25 DIAGNOSIS — M47816 Spondylosis without myelopathy or radiculopathy, lumbar region: Secondary | ICD-10-CM | POA: Diagnosis not present

## 2022-10-29 ENCOUNTER — Inpatient Hospital Stay: Payer: 59 | Attending: Oncology | Admitting: Oncology

## 2022-10-29 ENCOUNTER — Encounter: Payer: Self-pay | Admitting: Oncology

## 2022-10-29 VITALS — BP 120/79 | HR 75 | Temp 98.1°F | Resp 18 | Ht 63.0 in | Wt 245.0 lb

## 2022-10-29 DIAGNOSIS — Z86000 Personal history of in-situ neoplasm of breast: Secondary | ICD-10-CM

## 2022-10-29 DIAGNOSIS — Z08 Encounter for follow-up examination after completed treatment for malignant neoplasm: Secondary | ICD-10-CM | POA: Diagnosis not present

## 2022-10-29 DIAGNOSIS — D0512 Intraductal carcinoma in situ of left breast: Secondary | ICD-10-CM | POA: Insufficient documentation

## 2022-10-29 DIAGNOSIS — Z87891 Personal history of nicotine dependence: Secondary | ICD-10-CM | POA: Diagnosis not present

## 2022-10-29 DIAGNOSIS — Z79811 Long term (current) use of aromatase inhibitors: Secondary | ICD-10-CM | POA: Insufficient documentation

## 2022-11-01 NOTE — Progress Notes (Signed)
Hematology/Oncology Consult note [facility]  Telephone:([phone]) [phone] [phone]  Patient Care Team: Ellender Hosehenhen, Pat, NP as PCP - General (Family Medicine) Pershing ProudStuart, Dawn C, RN as Oncology Nurse Navigator Donnelly AngelicaMartini, Keisha N, RN as Oncology Nurse Navigator Merryl HackerGrossman, Janelle, NP as Nurse Practitioner (Nurse Practitioner)   Name of the patient: Kelli Abbott  [id number]  October 12, 1955   Date of visit: 11/01/22  Diagnosis-left breast DCIS ER positive  Chief complaint/ Reason for visit-routine follow-up of 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 Pamelia HoitGudena in [place] in July 2022 after her mammogram and biopsy was consistent with DCIS.  She was started on Arimidex at that time and subsequently saw Dr. Lemar LivingsByrnett and underwentLumpectomy.  Her original mammogram in March 2022 showed broad area of calcifications 7.3 x 7.3 cm in the lower inner quadrant of the left breast.  Final pathology showed intermediate to high-grade DCIS with comedonecrosis anterior margin less than 2 mm and inferior unifocal margin positive.  She did not undergo reexcision and completed adjuvant radiation treatment and radiation boost to the positive margin.  Patient was subsequently switched over from Arimidex to letrozole as she had side effects from Arimidex   Interval history-patient complains of generalized body aches and arthralgias which have been worse over the last few months.  Her shoulders and bilateral hips hurt especially.  ECOG PS- 1 Pain scale- 5   Review of systems- Review of Systems  Constitutional:  Negative for chills, fever, malaise/fatigue and weight loss.  HENT:  Negative for congestion, ear discharge and nosebleeds.   Eyes:  Negative for blurred vision.  Respiratory:  Negative for cough, hemoptysis, sputum production, shortness of breath and wheezing.   Cardiovascular:  Negative for chest pain, palpitations,  orthopnea and claudication.  Gastrointestinal:  Negative for abdominal pain, blood in stool, constipation, diarrhea, heartburn, melena, nausea and vomiting.  Genitourinary:  Negative for dysuria, flank pain, frequency, hematuria and urgency.  Musculoskeletal:  Positive for joint pain. Negative for back pain and myalgias.  Skin:  Negative for rash.  Neurological:  Negative for dizziness, tingling, focal weakness, seizures, weakness and headaches.  Endo/Heme/Allergies:  Does not bruise/bleed easily.  Psychiatric/Behavioral:  Negative for depression and suicidal ideas. The patient does not have insomnia.       Allergies  Allergen Reactions   Bupropion Hcl     Contributing to memory loss     Past Medical History:  Diagnosis Date   Anemia    Arthritis    Osteoarthritis   Breast cancer    Breast lump    Breast nodule    hypoechoic in right breast   Bursitis of shoulder region    Cancer    breast   Family history of breast cancer 11/06/2020   Family history of pancreatic cancer 11/06/2020   Hypertension    Mass on back    Personal history of radiation therapy 2009/2010   PONV (postoperative nausea and vomiting)    Skin lesions, generalized      Past Surgical History:  Procedure Laterality Date   BREAST BIOPSY Right 04/29/2008   malignant   BREAST BIOPSY Right 11/24/2015   benign   BREAST LUMPECTOMY Right 06/04/2008   BREAST LUMPECTOMY WITH NEEDLE LOCALIZATION Left 02/18/2021   Procedure: BREAST LUMPECTOMY WITH NEEDLE LOCALIZATION;  Surgeon: Earline MayotteByrnett, Jeffrey W, MD;  Location: [facility] ORS;  Service: General;  Laterality: Left;  combined case with Dr. Ulice Boldillingham   BREAST SURGERY  2009   R breast reexcision of margins   HYSTEROSCOPY WITH D & C N/A 11/06/2014   Procedure: DILATATION AND CURETTAGE /HYSTEROSCOPY with resection and myomectomy;  Surgeon: Geryl Rankins, MD;  Location: [facility];  Service: Gynecology;  Laterality: N/A;   MASS EXCISION N/A 01/17/2018   Procedure:  EXCISION  OF UPPER BACK MASS AND SKIN TAGS;  Surgeon: Almond Lint, MD;  Location: [facility];  Service: General;  Laterality: N/A;   TOTAL HIP ARTHROPLASTY Bilateral 04/2020   right   UNILATERAL BREAST REDUCTION Left 02/18/2021   Procedure: LEFT ONCOPLASTIC BREAST REDUCTION;  Surgeon: Peggye Form, DO;  Location: [facility] ORS;  Service: Plastics;  Laterality: Left;    Social History   Socioeconomic History   Marital status: Legally Separated    Spouse name: Not on file   Number of children: Not on file   Years of education: Not on file   Highest education level: Not on file  Occupational History   Not on file  Tobacco Use   Smoking status: Former    Packs/day: 1.00    Years: 20.00    Additional pack years: 0.00    Total pack years: 20.00    Types: Cigarettes    Quit date: 04/26/2008    Years since quitting: 14.5   Smokeless tobacco: Never  Vaping Use   Vaping Use: Never used  Substance and Sexual Activity   Alcohol use: Not Currently   Drug use: No   Sexual activity: Not Currently  Other Topics Concern   Not on file  Social History Narrative   Not on file   Social Determinants of Health   Financial Resource Strain: Not on file  Food Insecurity: Not on file  Transportation Needs: Not on file  Physical Activity: Not on file  Stress: Not on file  Social Connections: Not on file  Intimate Partner Violence: Not on file    Family History  Problem Relation Age of Onset   Hypertension Mother    Hypertension Father    Hypertension Brother    Breast cancer Paternal Aunt        dx before 29   Pancreatic cancer Paternal Grandfather        dx 93s     Current Outpatient Medications:    acetaminophen (TYLENOL) 650 MG CR tablet, Take 650 mg by mouth every 8 (eight) hours as needed for pain., Disp: , Rfl:    amLODipine (NORVASC) 5 MG tablet, Take 5 mg by mouth daily., Disp: , Rfl:    calcium carbonate (OS-CAL) 1250 (500 Ca) MG chewable tablet, Chew 1 tablet by mouth  daily., Disp: , Rfl:    cholecalciferol (VITAMIN D) 1000 units tablet, Take 1,000 Units by mouth daily., Disp: , Rfl:    DULoxetine (CYMBALTA) 20 MG capsule, Take 1 capsule (20 mg total) by mouth daily., Disp: 30 capsule, Rfl: 0   ferrous sulfate 325 (65 FE) MG tablet, Take 325 mg by mouth daily with breakfast., Disp: , Rfl:    HYDROcodone-acetaminophen (NORCO) 10-325 MG tablet, Take 1-2 tablets by mouth every 6 (six) hours as needed for moderate pain or severe pain., Disp: 56 tablet, Rfl: 0   irbesartan-hydrochlorothiazide (AVALIDE) 300-12.5 MG tablet, Take 1 tablet by mouth daily., Disp: , Rfl:    letrozole (FEMARA) 2.5 MG tablet, TAKE ONE TABLET BY MOUTH EVERY DAY, Disp: 30 tablet, Rfl: 3   olopatadine (PATANOL) 0.1 % ophthalmic solution, Place 1 drop into both eyes 2 (two) times daily as needed  for allergies., Disp: , Rfl:    OZEMPIC, 0.25 OR 0.5 MG/DOSE, 2 MG/1.5ML SOPN, Inject 0.5 mg into the skin every Thursday., Disp: , Rfl:    phentermine (ADIPEX-P) 37.5 MG tablet, Take 37.5 mg by mouth daily before breakfast., Disp: , Rfl:    solifenacin (VESICARE) 10 MG tablet, Take 10 mg by mouth daily., Disp: , Rfl:   Physical exam:  Vitals:   10/29/22 1343  BP: 120/79  Pulse: 75  Resp: 18  Temp: 98.1 F (36.7 C)  TempSrc: Tympanic  SpO2: 98%  Weight: 245 lb (111.1 kg)  Height: 5\' 3"  (1.6 m)   Physical Exam Cardiovascular:     Rate and Rhythm: Normal rate and regular rhythm.     Heart sounds: Normal heart sounds.  Pulmonary:     Effort: Pulmonary effort is normal.     Breath sounds: Normal breath sounds.  Abdominal:     General: Bowel sounds are normal.     Palpations: Abdomen is soft.  Skin:    General: Skin is warm and dry.  Neurological:     Mental Status: She is alert and oriented to person, place, and time.    Breast exam was performed in seated and lying down position. Patient is status post left lumpectomy with a well-healed surgical scar. No evidence of any palpable  masses. No evidence of axillary adenopathy. No evidence of any palpable masses or lumps in the right breast. No evidence of right axillary adenopathy      Latest Ref Rng & Units 01/05/2018   10:12 AM  CMP  Glucose 65 - 99 mg/dL 94   BUN 6 - 20 mg/dL 8   Creatinine 1.06 - 2.69 mg/dL 4.85   Sodium 462 - 703 mmol/L 140   Potassium 3.5 - 5.1 mmol/L 4.0   Chloride 101 - 111 mmol/L 105   CO2 22 - 32 mmol/L 28   Calcium 8.9 - 10.3 mg/dL 50.0       Latest Ref Rng & Units 01/11/2022    1:46 PM  CBC  WBC 4.0 - 10.5 K/uL 10.2   Hemoglobin 12.0 - 15.0 g/dL 93.8   Hematocrit 18.2 - 46.0 % 43.6   Platelets 150 - 400 K/uL 297      Assessment and plan- Patient is a 67 y.o. 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  Have asked her to hold off on taking letrozole for about 3 weeks to see how her symptoms are doing.  She will call us and let us know.  If symptoms are remarkably improved after stopping letrozole we can consider switching her to Aromasin or tamoxifen.  However if symptoms are no better despite stopping the letrozole for 3 weeks it is unlikely that letrozole is the cause of her arthralgias.  Clinically otherwise she isWell with no concerning signs and symptoms of recurrence based on today's exam.  I will see him back in 6 months no labs.  We will schedule her mammogram for next month   Visit Diagnosis 1. Encounter for follow-up surveillance of ductal carcinoma in situ (DCIS) of breast   2. Use of letrozole (Femara)      Dr. Owens Shark, MD, MPH [facility] [phone] 11/01/2022 9:05 AM

## 2022-11-05 ENCOUNTER — Ambulatory Visit
Admission: RE | Admit: 2022-11-05 | Discharge: 2022-11-05 | Disposition: A | Discharge status: Home / Self Care | Payer: 59 | Source: Ambulatory Visit | Attending: Oncology | Admitting: Oncology

## 2022-11-05 DIAGNOSIS — C50411 Malignant neoplasm of upper-outer quadrant of right female breast: Secondary | ICD-10-CM | POA: Diagnosis not present

## 2022-11-05 DIAGNOSIS — Z170 Estrogen receptor positive status [ER+]: Secondary | ICD-10-CM | POA: Diagnosis not present

## 2022-11-05 DIAGNOSIS — Z853 Personal history of malignant neoplasm of breast: Secondary | ICD-10-CM | POA: Diagnosis not present

## 2022-11-22 ENCOUNTER — Other Ambulatory Visit (HOSPITAL_COMMUNITY): Payer: Self-pay | Admitting: Psychiatry

## 2022-11-22 DIAGNOSIS — F4310 Post-traumatic stress disorder, unspecified: Secondary | ICD-10-CM

## 2022-11-24 ENCOUNTER — Telehealth (HOSPITAL_COMMUNITY): Payer: Self-pay | Admitting: *Deleted

## 2022-11-24 DIAGNOSIS — F4310 Post-traumatic stress disorder, unspecified: Secondary | ICD-10-CM

## 2022-11-24 DIAGNOSIS — M47816 Spondylosis without myelopathy or radiculopathy, lumbar region: Secondary | ICD-10-CM | POA: Diagnosis not present

## 2022-11-24 DIAGNOSIS — M169 Osteoarthritis of hip, unspecified: Secondary | ICD-10-CM | POA: Diagnosis not present

## 2022-11-24 DIAGNOSIS — C50811 Malignant neoplasm of overlapping sites of right female breast: Secondary | ICD-10-CM | POA: Diagnosis not present

## 2022-11-24 DIAGNOSIS — R030 Elevated blood-pressure reading, without diagnosis of hypertension: Secondary | ICD-10-CM | POA: Diagnosis not present

## 2022-11-24 DIAGNOSIS — Z79899 Other long term (current) drug therapy: Secondary | ICD-10-CM | POA: Diagnosis not present

## 2022-11-24 DIAGNOSIS — Z9181 History of falling: Secondary | ICD-10-CM | POA: Diagnosis not present

## 2022-11-24 MED ORDER — DULOXETINE HCL 20 MG PO CPEP: 20.0000 mg | ORAL_CAPSULE | Freq: Every day | ORAL | 0 refills | Status: DC

## 2022-11-24 NOTE — Telephone Encounter (Signed)
I have send 15-day supply to the pharmacy.

## 2022-11-24 NOTE — Telephone Encounter (Signed)
DULOXETINE (CYMBALTA) 20 MG capsule Take 1 capsule (20 mg total) by mouth daily. Dispense: 30 capsule, Refills: 0 ordered  10/14/2022 10/14/2023 Arfeen, Phillips Grout, MD          Summary: Take 1 capsule (20 mg total) by mouth daily.,  Starting Thu 10/14/2022, Until Fri 10/14/2023,  Normal Dose, Route, Frequency: 20 mg, Oral, Daily Dx Associated: Start Date & Time: 03/21/2024E nd w/ Doses: 10/14/2023 after 365 doses Ord/Sold: 10/14/2022 (O) Ordered On: 10/14/2022 ReportPharmacy: [facility] - [place], [place] -  [id number]     LAST FILL 10/14/22

## 2022-11-25 ENCOUNTER — Ambulatory Visit (HOSPITAL_BASED_OUTPATIENT_CLINIC_OR_DEPARTMENT_OTHER): Payer: 59 | Admitting: Psychiatry

## 2022-11-25 ENCOUNTER — Encounter (HOSPITAL_COMMUNITY): Payer: Self-pay | Admitting: Psychiatry

## 2022-11-25 VITALS — BP 117/82 | HR 89 | Ht 63.0 in | Wt 247.0 lb

## 2022-11-25 DIAGNOSIS — F4310 Post-traumatic stress disorder, unspecified: Secondary | ICD-10-CM

## 2022-11-25 DIAGNOSIS — F419 Anxiety disorder, unspecified: Secondary | ICD-10-CM | POA: Diagnosis not present

## 2022-11-25 MED ORDER — DULOXETINE HCL 30 MG PO CPEP: 30.0000 mg | ORAL_CAPSULE | Freq: Every day | ORAL | 1 refills | Status: DC

## 2022-11-25 NOTE — Progress Notes (Signed)
BH MD/PA/NP OP Progress Note    Patient location; office Provider location; office  11/25/2022 4:10 PM Kelli Abbott  MRN:  [id number]  Chief Complaint:  Chief Complaint  Patient presents with   Follow-up   Anxiety   Depression   HPI: Patient came in for her follow-up appointment.  She is a 67 year old African-American single woman who is referred from [facility] for the management of anxiety and depression.  Patient reported a lot of ruminative thoughts and thinking about her past and childhood trauma.  She had a lot of guilt and regret about making some impulsive decisions and lately she is thinking about it.  She lives by herself.  She reported lot of struggle in her early life and always tried to support her only daughter.  There was a time she had to quit working but due to struggle with finances she is back on work.  She was having a lot of anxiety, racing thoughts, poor sleep and sometimes crying spells.  She works at [employer] 5 hours a day for 2 days.  We started her on low-dose Cymbalta to help her anxiety, sleep and nervousness.  She noticed much improvement at the low-dose of Cymbalta.  She has tolerating very well and reported no tremors or shakes or any EPS.  Her daughter lives in [place] and she usually go to help the grandkids when daughter goes to conferences.  Patient told she is scheduled to go for 15 days to [place] and of this month and initially she thought that she was not able to go due to extreme anxiety about flying but now she feels her nerves are better and she will go to [place].  Her daughter is a Medical illustrator at [employer] at [employer].  She tried to keep herself busy.  She does talk to her grandkids on a regular basis and she excited going to see them.  She usually enjoys a lot the company and grandkids.  She admitted some sadness about the family situation of her daughter.  Patient told her son-in-law is deployed overseas and they have  not been in good relationship.  She denies drinking or using any illegal substances.  She lives in assisted living facility and like to have a dog but not sure if she can afford because assisted living facility asking $300 pet fees.  She is not sure if she can afford that.  Overall she feels the Cymbalta helps her anxiety, nightmares, flashback.  Occasionally she has difficulty sleeping due to nightmares but she believes due to a bad neighborhood.  She feels calm and denies any panic attack.  She is wondering if the dose can further increase because she noticed it helps her pain.  Patient has a shoulder pain.  She denies any hallucination, paranoia, suicidal thoughts.  She reported her crying spells are better and she does not cry as much.  Her appetite is okay.  Her weight is stable.  Patient denies any mania.  Visit Diagnosis:    ICD-10-CM   1. PTSD (post-traumatic stress disorder)  F43.10       Past Psychiatric History:  No history of suicidal attempt, paranoia, hallucination. Given Wellbutrin but do not remember the details.   Past Medical History:  Past Medical History:  Diagnosis Date   Anemia    Arthritis    Osteoarthritis   Breast cancer (HCC)    Breast lump    Breast nodule    hypoechoic in right breast  Bursitis of shoulder region    Cancer [facility])    breast   Family history of breast cancer 11/06/2020   Family history of pancreatic cancer 11/06/2020   Hypertension    Mass on back    Personal history of radiation therapy 2009/2010   PONV (postoperative nausea and vomiting)    Skin lesions, generalized     Past Surgical History:  Procedure Laterality Date   BREAST BIOPSY Right 04/29/2008   malignant   BREAST BIOPSY Right 11/24/2015   benign   BREAST LUMPECTOMY Right 06/04/2008   BREAST LUMPECTOMY WITH NEEDLE LOCALIZATION Left 02/18/2021   Procedure: BREAST LUMPECTOMY WITH NEEDLE LOCALIZATION;  Surgeon: Earline Mayotte, MD;  Location: [facility] ORS;  Service: General;   Laterality: Left;  combined case with Dr. Ulice Bold   BREAST SURGERY  2009   R breast reexcision of margins   HYSTEROSCOPY WITH D & C N/A 11/06/2014   Procedure: DILATATION AND CURETTAGE /HYSTEROSCOPY with resection and myomectomy;  Surgeon: Geryl Rankins, MD;  Location: [facility];  Service: Gynecology;  Laterality: N/A;   MASS EXCISION N/A 01/17/2018   Procedure: EXCISION  OF UPPER BACK MASS AND SKIN TAGS;  Surgeon: Almond Lint, MD;  Location: [facility] OR;  Service: General;  Laterality: N/A;   TOTAL HIP ARTHROPLASTY Bilateral 04/2020   right   UNILATERAL BREAST REDUCTION Left 02/18/2021   Procedure: LEFT ONCOPLASTIC BREAST REDUCTION;  Surgeon: Peggye Form, DO;  Location: [facility] ORS;  Service: Plastics;  Laterality: Left;    Family Psychiatric History: Reviewed.  Family History:  Family History  Problem Relation Age of Onset   Hypertension Mother    Hypertension Father    Hypertension Brother    Breast cancer Paternal Aunt        dx before 54   Pancreatic cancer Paternal Grandfather        dx 71s    Social History:  Social History   Socioeconomic History   Marital status: Legally Separated    Spouse name: Not on file   Number of children: Not on file   Years of education: Not on file   Highest education level: Not on file  Occupational History   Not on file  Tobacco Use   Smoking status: Former    Packs/day: 1.00    Years: 20.00    Additional pack years: 0.00    Total pack years: 20.00    Types: Cigarettes    Quit date: 04/26/2008    Years since quitting: 14.5   Smokeless tobacco: Never  Vaping Use   Vaping Use: Never used  Substance and Sexual Activity   Alcohol use: Not Currently   Drug use: No   Sexual activity: Not Currently  Other Topics Concern   Not on file  Social History Narrative   Not on file   Social Determinants of Health   Financial Resource Strain: Not on file  Food Insecurity: Not on file  Transportation Needs: Not on file  Physical  Activity: Not on file  Stress: Not on file  Social Connections: Not on file    Allergies:  Allergies  Allergen Reactions   Bupropion Hcl     Contributing to memory loss    Metabolic Disorder Labs: No results found for: "HGBA1C", "MPG" No results found for: "PROLACTIN" No results found for: "CHOL", "TRIG", "HDL", "CHOLHDL", "VLDL", "LDLCALC" No results found for: "TSH"  Therapeutic Level Labs: No results found for: "LITHIUM" No results found for: "VALPROATE" No results found for: "CBMZ"  Current Medications: Current Outpatient Medications  Medication Sig Dispense Refill   acetaminophen (TYLENOL) 650 MG CR tablet Take 650 mg by mouth every 8 (eight) hours as needed for pain.     amLODipine (NORVASC) 5 MG tablet Take 5 mg by mouth daily.     calcium carbonate (OS-CAL) 1250 (500 Ca) MG chewable tablet Chew 1 tablet by mouth daily.     cholecalciferol (VITAMIN D) 1000 units tablet Take 1,000 Units by mouth daily.     DULoxetine (CYMBALTA) 20 MG capsule Take 1 capsule (20 mg total) by mouth daily for 15 days. 15 capsule 0   ferrous sulfate 325 (65 FE) MG tablet Take 325 mg by mouth daily with breakfast.     HYDROcodone-acetaminophen (NORCO) 10-325 MG tablet Take 1-2 tablets by mouth every 6 (six) hours as needed for moderate pain or severe pain. 56 tablet 0   irbesartan-hydrochlorothiazide (AVALIDE) 300-12.5 MG tablet Take 1 tablet by mouth daily.     letrozole (FEMARA) 2.5 MG tablet TAKE ONE TABLET BY MOUTH EVERY DAY 30 tablet 3   olopatadine (PATANOL) 0.1 % ophthalmic solution Place 1 drop into both eyes 2 (two) times daily as needed for allergies.     OZEMPIC, 0.25 OR 0.5 MG/DOSE, 2 MG/1.5ML SOPN Inject 0.5 mg into the skin every Thursday.     phentermine (ADIPEX-P) 37.5 MG tablet Take 37.5 mg by mouth daily before breakfast.     solifenacin (VESICARE) 10 MG tablet Take 10 mg by mouth daily.     No current facility-administered medications for this visit.      Musculoskeletal: Strength & Muscle Tone: within normal limits Gait & Station: normal Patient leans: N/A  Psychiatric Specialty Exam: Review of Systems  Musculoskeletal:        Shoulder pain    Blood pressure 117/82, pulse 89, height 5\' 3"  (1.6 m), weight 247 lb (112 kg).There is no height or weight on file to calculate BMI.  General Appearance: Casual  Eye Contact:  Good  Speech:  Clear and Coherent  Volume:  Normal  Mood:  Euthymic  Affect:  Appropriate  Thought Process:  Goal Directed  Orientation:  Full (Time, Place, and Person)  Thought Content: Logical   Suicidal Thoughts:  No  Homicidal Thoughts:  No  Memory:  Immediate;   Good Recent;   Fair Remote;   Good  Judgement:  Intact  Insight:  Present  Psychomotor Activity:  Decreased  Concentration:  Concentration: Fair and Attention Span: Fair  Recall:  Good  Fund of Knowledge: Good  Language: Good  Akathisia:  No  Handed:  Right  AIMS (if indicated): not done  Assets:  Communication Skills Desire for Improvement Housing Resilience Social Support Transportation  ADL's:  Intact  Cognition: WNL  Sleep:  Fair   Screenings: GAD-7    Flowsheet Row [facility]  Total GAD-7 Score 4      PHQ2-9    Flowsheet Row Office Visit from 10/14/2022 in [facility]  PHQ-2 Total Score 2  PHQ-9 Total Score 5      Flowsheet Row Pre-Admission Testing 45 from 10/22/2021 in [facility] Admission (Discharged) from 02/18/2021 in [facility] Pre-Admission Testing 45 from 02/10/2021 in [facility]  C-SSRS RISK CATEGORY No Risk No Risk No Risk        Assessment and Plan: Patient is 67 year old African-American  employed female with history of depression, anxiety, PTSD.  She is taking  Cymbalta 20 mg and she is feeling better.  She reported her pain also get better.  She is wondering if the dose can further increase.  We have referred for see a therapist however she has not scheduled appointment.  We will increase Cymbalta 30 mg daily and also will provide a list of therapist to help her coping skills.  Patient excited about going to [place] for 15 days and of this month.  Recommended to call us back if he has any question or any concern.  Discussed safety concern that anytime having active suicidal thoughts or homicidal thought then she need to call 911 or go to local emergency room.  Follow-up 6 weeks in person at office.  Collaboration of Care: Collaboration of Care: Other provider involved in patient's care AEB notes are available in epic to review.  Patient/Guardian was advised Release of Information must be obtained prior to any record release in order to collaborate their care with an outside provider. Patient/Guardian was advised if they have not already done so to contact the registration department to sign all necessary forms in order for [place] to release information regarding their care.   Consent: Patient/Guardian gives verbal consent for treatment and assignment of benefits for services provided during this visit. Patient/Guardian expressed understanding and agreed to proceed.    Cleotis Nipper, MD 11/25/2022, 4:10 PM

## 2022-11-26 ENCOUNTER — Telehealth (HOSPITAL_COMMUNITY): Payer: Self-pay | Admitting: Psychiatry

## 2022-11-26 NOTE — Telephone Encounter (Signed)
D:  Dr. Lolly Mustache requested case manager to assist pt with finding a therapist.  A:  Placed call to pt and provided her with [facility] [phone], [facility] [phone] and Milicent Day, LCSW [phone] phone #'s.  Also, encouraged pt to contact her insurance company for providers if the above doesn't work out.  Inform Dr. Lolly Mustache.  R:  Pt receptive.

## 2022-12-06 DIAGNOSIS — M12812 Other specific arthropathies, not elsewhere classified, left shoulder: Secondary | ICD-10-CM | POA: Diagnosis not present

## 2022-12-06 DIAGNOSIS — M25512 Pain in left shoulder: Secondary | ICD-10-CM | POA: Diagnosis not present

## 2022-12-28 ENCOUNTER — Other Ambulatory Visit (HOSPITAL_COMMUNITY): Payer: Self-pay | Admitting: Psychiatry

## 2022-12-28 DIAGNOSIS — F4310 Post-traumatic stress disorder, unspecified: Secondary | ICD-10-CM

## 2022-12-28 DIAGNOSIS — F419 Anxiety disorder, unspecified: Secondary | ICD-10-CM

## 2023-01-05 DIAGNOSIS — Z9181 History of falling: Secondary | ICD-10-CM | POA: Diagnosis not present

## 2023-01-05 DIAGNOSIS — M169 Osteoarthritis of hip, unspecified: Secondary | ICD-10-CM | POA: Diagnosis not present

## 2023-01-05 DIAGNOSIS — C50811 Malignant neoplasm of overlapping sites of right female breast: Secondary | ICD-10-CM | POA: Diagnosis not present

## 2023-01-05 DIAGNOSIS — M47816 Spondylosis without myelopathy or radiculopathy, lumbar region: Secondary | ICD-10-CM | POA: Diagnosis not present

## 2023-01-05 DIAGNOSIS — Z79899 Other long term (current) drug therapy: Secondary | ICD-10-CM | POA: Diagnosis not present

## 2023-01-05 DIAGNOSIS — R030 Elevated blood-pressure reading, without diagnosis of hypertension: Secondary | ICD-10-CM | POA: Diagnosis not present

## 2023-01-13 ENCOUNTER — Other Ambulatory Visit: Payer: Self-pay

## 2023-01-13 ENCOUNTER — Encounter (HOSPITAL_COMMUNITY): Payer: Self-pay | Admitting: Psychiatry

## 2023-01-13 ENCOUNTER — Ambulatory Visit (HOSPITAL_BASED_OUTPATIENT_CLINIC_OR_DEPARTMENT_OTHER): Payer: 59 | Admitting: Psychiatry

## 2023-01-13 VITALS — Ht 63.0 in | Wt 250.0 lb

## 2023-01-13 DIAGNOSIS — F419 Anxiety disorder, unspecified: Secondary | ICD-10-CM

## 2023-01-13 DIAGNOSIS — F4310 Post-traumatic stress disorder, unspecified: Secondary | ICD-10-CM

## 2023-01-13 MED ORDER — HYDROXYZINE HCL 10 MG PO TABS: ORAL_TABLET | ORAL | 0 refills | Status: DC

## 2023-01-13 MED ORDER — DULOXETINE HCL 30 MG PO CPEP: 30.0000 mg | ORAL_CAPSULE | Freq: Every day | ORAL | 2 refills | Status: DC

## 2023-01-13 NOTE — Progress Notes (Signed)
BH MD/PA/NP OP Progress Note    Patient location; office Provider location; office  01/13/2023 3:38 PM Kelli Abbott  MRN:  [id number]  Chief Complaint:  Chief Complaint  Patient presents with   Follow-up   HPI: Patient came for her follow-up appointment.  She recently came back from [place].  She reported that she did not like the atmosphere of her daughter's home especially with the kids.  Patient told her older grandson totaled the car and she believed her parents are very lenient towards him.  Now the grandson is using his father's car.  Patient tried to talk to her daughter but she felt ignored.  Overall she like the Cymbalta.  We increased the dose on the last visit.  She has sleeping not good because of flashbacks, nightmares and dreams.  She believes it is the medicine that she is taking for cancer causing it.  She denies any crying spells or any feeling of hopelessness or depressive symptoms.  Her energy level is good.  She has a hard time getting the therapy because of insurance.  She has no tremors, shakes or any EPS.  Patient reported this time when she flew to [place] she was not very anxious and she was very relaxed and calm during the flight.  She was also very excited because she flew first class.  Patient wondering if she can try something to help her sleep.  She denies any mania, anger, suicidal thoughts.  Her appetite is okay.  Her energy level is good.  She denies drinking or using any illegal substances.  She is taking Cymbalta 30 mg.  Visit Diagnosis:    ICD-10-CM   1. PTSD (post-traumatic stress disorder)  F43.10 DULoxetine (CYMBALTA) 30 MG capsule    hydrOXYzine (ATARAX) 10 MG tablet    2. Anxiety  F41.9 DULoxetine (CYMBALTA) 30 MG capsule    hydrOXYzine (ATARAX) 10 MG tablet      Past Psychiatric History:  No history of suicidal attempt, paranoia, hallucination. Given Wellbutrin but do not remember the details.   Past Medical History:  Past Medical History:   Diagnosis Date   Anemia    Arthritis    Osteoarthritis   Breast cancer (HCC)    Breast lump    Breast nodule    hypoechoic in right breast   Bursitis of shoulder region    Cancer [facility])    breast   Family history of breast cancer 11/06/2020   Family history of pancreatic cancer 11/06/2020   Hypertension    Mass on back    Personal history of radiation therapy 2009/2010   PONV (postoperative nausea and vomiting)    Skin lesions, generalized     Past Surgical History:  Procedure Laterality Date   BREAST BIOPSY Right 04/29/2008   malignant   BREAST BIOPSY Right 11/24/2015   benign   BREAST LUMPECTOMY Right 06/04/2008   BREAST LUMPECTOMY WITH NEEDLE LOCALIZATION Left 02/18/2021   Procedure: BREAST LUMPECTOMY WITH NEEDLE LOCALIZATION;  Surgeon: Earline Mayotte, MD;  Location: [facility] ORS;  Service: General;  Laterality: Left;  combined case with Dr. Ulice Bold   BREAST SURGERY  2009   R breast reexcision of margins   HYSTEROSCOPY WITH D & C N/A 11/06/2014   Procedure: DILATATION AND CURETTAGE /HYSTEROSCOPY with resection and myomectomy;  Surgeon: Geryl Rankins, MD;  Location: [facility];  Service: Gynecology;  Laterality: N/A;   MASS EXCISION N/A 01/17/2018   Procedure: EXCISION  OF UPPER BACK MASS AND SKIN TAGS;  Surgeon: Almond Lint, MD;  Location: [facility] OR;  Service: General;  Laterality: N/A;   TOTAL HIP ARTHROPLASTY Bilateral 04/2020   right   UNILATERAL BREAST REDUCTION Left 02/18/2021   Procedure: LEFT ONCOPLASTIC BREAST REDUCTION;  Surgeon: Peggye Form, DO;  Location: [facility] ORS;  Service: Plastics;  Laterality: Left;    Family Psychiatric History: Reviewed.  Family History:  Family History  Problem Relation Age of Onset   Hypertension Mother    Hypertension Father    Hypertension Brother    Breast cancer Paternal Aunt        dx before 10   Pancreatic cancer Paternal Grandfather        dx 35s    Social History:  Social History   Socioeconomic History    Marital status: Legally Separated    Spouse name: Not on file   Number of children: Not on file   Years of education: Not on file   Highest education level: Not on file  Occupational History   Not on file  Tobacco Use   Smoking status: Former    Packs/day: 1.00    Years: 20.00    Additional pack years: 0.00    Total pack years: 20.00    Types: Cigarettes    Quit date: 04/26/2008    Years since quitting: 14.7   Smokeless tobacco: Never  Vaping Use   Vaping Use: Never used  Substance and Sexual Activity   Alcohol use: Not Currently   Drug use: No   Sexual activity: Not Currently  Other Topics Concern   Not on file  Social History Narrative   Not on file   Social Determinants of Health   Financial Resource Strain: Not on file  Food Insecurity: Not on file  Transportation Needs: Not on file  Physical Activity: Not on file  Stress: Not on file  Social Connections: Not on file    Allergies:  Allergies  Allergen Reactions   Bupropion Hcl     Contributing to memory loss    Metabolic Disorder Labs: No results found for: "HGBA1C", "MPG" No results found for: "PROLACTIN" No results found for: "CHOL", "TRIG", "HDL", "CHOLHDL", "VLDL", "LDLCALC" No results found for: "TSH"  Therapeutic Level Labs: No results found for: "LITHIUM" No results found for: "VALPROATE" No results found for: "CBMZ"  Current Medications: Current Outpatient Medications  Medication Sig Dispense Refill   acetaminophen (TYLENOL) 650 MG CR tablet Take 650 mg by mouth every 8 (eight) hours as needed for pain.     amLODipine (NORVASC) 5 MG tablet Take 5 mg by mouth daily.     calcium carbonate (OS-CAL) 1250 (500 Ca) MG chewable tablet Chew 1 tablet by mouth daily.     cholecalciferol (VITAMIN D) 1000 units tablet Take 1,000 Units by mouth daily.     DULoxetine (CYMBALTA) 30 MG capsule Take 1 capsule (30 mg total) by mouth daily. 30 capsule 1   ferrous sulfate 325 (65 FE) MG tablet Take 325 mg by  mouth daily with breakfast.     HYDROcodone-acetaminophen (NORCO) 10-325 MG tablet Take 1-2 tablets by mouth every 6 (six) hours as needed for moderate pain or severe pain. 56 tablet 0   irbesartan-hydrochlorothiazide (AVALIDE) 300-12.5 MG tablet Take 1 tablet by mouth daily.     letrozole (FEMARA) 2.5 MG tablet TAKE ONE TABLET BY MOUTH EVERY DAY 30 tablet 3   olopatadine (PATANOL) 0.1 % ophthalmic solution Place 1 drop into both eyes 2 (two) times daily as needed for  allergies.     OZEMPIC, 0.25 OR 0.5 MG/DOSE, 2 MG/1.5ML SOPN Inject 0.5 mg into the skin every Thursday.     phentermine (ADIPEX-P) 37.5 MG tablet Take 37.5 mg by mouth daily before breakfast.     solifenacin (VESICARE) 10 MG tablet Take 10 mg by mouth daily.     No current facility-administered medications for this visit.     Musculoskeletal: Strength & Muscle Tone: within normal limits Gait & Station: normal Patient leans: N/A Psychiatric Specialty Exam: Physical Exam  Review of Systems  Height 5\' 3"  (1.6 m), weight 250 lb (113.4 kg).Body mass index is 44.29 kg/m.  General Appearance: Casual  Eye Contact:  Good  Speech:  Normal Rate  Volume:  Normal  Mood:  Anxious and Dysphoric  Affect:  Appropriate  Thought Process:  Goal Directed  Orientation:  Full (Time, Place, and Person)  Thought Content:  Rumination  Suicidal Thoughts:  No  Homicidal Thoughts:  No  Memory:  Immediate;   Good Recent;   Fair Remote;   Fair  Judgement:  Intact  Insight:  Fair  Psychomotor Activity:  Decreased  Concentration:  Concentration: Fair and Attention Span: Fair  Recall:  Good  Fund of Knowledge:  Good  Language:  Good  Akathisia:  No  Handed:  Right  AIMS (if indicated):     Assets:  Communication Skills Desire for Improvement Housing Transportation  ADL's:  Intact  Cognition:  WNL  Sleep:   fair     Screenings: GAD-7    Flowsheet Row [facility]  Total GAD-7 Score 4      PHQ2-9    Flowsheet Row Office Visit from 10/14/2022 [facility]  PHQ-2 Total Score 2  PHQ-9 Total Score 5      Flowsheet Row Pre-Admission Testing 45 from 10/22/2021 in [facility] Admission (Discharged) from 02/18/2021 in [facility] Pre-Admission Testing 45 from 02/10/2021 in [facility] PRE ADMISSION TESTING  C-SSRS RISK CATEGORY No Risk No Risk No Risk        Assessment and Plan: Discuss family matters specially not happy with her grand son who is 90 year old and recently totaled the car but parents did not do anything or he had no consequences.  Reassurance given.  Emphasized again to see a therapist to help her coping skills.  Patient will call again to the names that she was provided for therapy.  We will add low-dose hydroxyzine 10-20 mg to help with anxiety and sleep.  Patient will also discuss with her oncologist to address possible side effects of cancer medicine that may be causing insomnia.  She liked the current dose of Cymbalta.  Continue 30 mg daily.  I recommend to call us back if she has any question, concerns or if she feels worsening of the symptoms.  Will follow up in 3 months.   Collaboration of Care: Collaboration of Care: Other provider involved in patient's care AEB notes are available in epic to review.  Patient/Guardian was advised Release of Information must be obtained prior to any record release in order to collaborate their care with an outside provider. Patient/Guardian was advised if they have not already done so to contact the registration department to sign all necessary forms in order for [place] to release information regarding their care.   Consent: Patient/Guardian gives verbal consent for treatment and assignment of benefits  for services provided during this visit.  Patient/Guardian expressed understanding and agreed to proceed.    Cleotis Nipper, MD 01/13/2023, 3:38 PM

## 2023-01-25 ENCOUNTER — Other Ambulatory Visit: Payer: Self-pay | Admitting: Oncology

## 2023-01-25 NOTE — Telephone Encounter (Signed)
I called patient to ask if she needed this refill and if she is still taking Letrozole as the last office note form April she was stopping it for 3 weeks to see if her arthralgias improve and she was to call us back and let us know. I will await to hear back from her , I had to leave a voice mail for her

## 2023-02-03 DIAGNOSIS — Z79899 Other long term (current) drug therapy: Secondary | ICD-10-CM | POA: Diagnosis not present

## 2023-02-03 DIAGNOSIS — R030 Elevated blood-pressure reading, without diagnosis of hypertension: Secondary | ICD-10-CM | POA: Diagnosis not present

## 2023-02-03 DIAGNOSIS — M169 Osteoarthritis of hip, unspecified: Secondary | ICD-10-CM | POA: Diagnosis not present

## 2023-02-03 DIAGNOSIS — M47816 Spondylosis without myelopathy or radiculopathy, lumbar region: Secondary | ICD-10-CM | POA: Diagnosis not present

## 2023-02-03 DIAGNOSIS — Z9181 History of falling: Secondary | ICD-10-CM | POA: Diagnosis not present

## 2023-02-03 DIAGNOSIS — C50811 Malignant neoplasm of overlapping sites of right female breast: Secondary | ICD-10-CM | POA: Diagnosis not present

## 2023-03-03 DIAGNOSIS — Z79899 Other long term (current) drug therapy: Secondary | ICD-10-CM | POA: Diagnosis not present

## 2023-03-03 DIAGNOSIS — M47816 Spondylosis without myelopathy or radiculopathy, lumbar region: Secondary | ICD-10-CM | POA: Diagnosis not present

## 2023-03-03 DIAGNOSIS — R030 Elevated blood-pressure reading, without diagnosis of hypertension: Secondary | ICD-10-CM | POA: Diagnosis not present

## 2023-03-03 DIAGNOSIS — Z9181 History of falling: Secondary | ICD-10-CM | POA: Diagnosis not present

## 2023-03-03 DIAGNOSIS — C50811 Malignant neoplasm of overlapping sites of right female breast: Secondary | ICD-10-CM | POA: Diagnosis not present

## 2023-03-03 DIAGNOSIS — M169 Osteoarthritis of hip, unspecified: Secondary | ICD-10-CM | POA: Diagnosis not present

## 2023-03-03 DIAGNOSIS — G894 Chronic pain syndrome: Secondary | ICD-10-CM | POA: Diagnosis not present

## 2023-03-21 ENCOUNTER — Other Ambulatory Visit (HOSPITAL_COMMUNITY): Payer: Self-pay | Admitting: Psychiatry

## 2023-03-21 DIAGNOSIS — F4310 Post-traumatic stress disorder, unspecified: Secondary | ICD-10-CM

## 2023-03-21 DIAGNOSIS — F419 Anxiety disorder, unspecified: Secondary | ICD-10-CM

## 2023-04-04 DIAGNOSIS — G894 Chronic pain syndrome: Secondary | ICD-10-CM | POA: Diagnosis not present

## 2023-04-04 DIAGNOSIS — Z79899 Other long term (current) drug therapy: Secondary | ICD-10-CM | POA: Diagnosis not present

## 2023-04-04 DIAGNOSIS — Z9181 History of falling: Secondary | ICD-10-CM | POA: Diagnosis not present

## 2023-04-04 DIAGNOSIS — M47816 Spondylosis without myelopathy or radiculopathy, lumbar region: Secondary | ICD-10-CM | POA: Diagnosis not present

## 2023-04-04 DIAGNOSIS — C50811 Malignant neoplasm of overlapping sites of right female breast: Secondary | ICD-10-CM | POA: Diagnosis not present

## 2023-04-04 DIAGNOSIS — M169 Osteoarthritis of hip, unspecified: Secondary | ICD-10-CM | POA: Diagnosis not present

## 2023-04-14 ENCOUNTER — Ambulatory Visit (HOSPITAL_COMMUNITY): Payer: 59 | Admitting: Psychiatry

## 2023-04-21 ENCOUNTER — Other Ambulatory Visit: Payer: Self-pay

## 2023-04-21 ENCOUNTER — Ambulatory Visit (HOSPITAL_BASED_OUTPATIENT_CLINIC_OR_DEPARTMENT_OTHER): Payer: 59 | Admitting: Psychiatry

## 2023-04-21 ENCOUNTER — Encounter (HOSPITAL_COMMUNITY): Payer: Self-pay | Admitting: Psychiatry

## 2023-04-21 DIAGNOSIS — F419 Anxiety disorder, unspecified: Secondary | ICD-10-CM | POA: Diagnosis not present

## 2023-04-21 DIAGNOSIS — F4310 Post-traumatic stress disorder, unspecified: Secondary | ICD-10-CM | POA: Diagnosis not present

## 2023-04-21 MED ORDER — HYDROXYZINE HCL 10 MG PO TABS: ORAL_TABLET | ORAL | 1 refills | Status: DC

## 2023-04-21 MED ORDER — DULOXETINE HCL 30 MG PO CPEP: 30.0000 mg | ORAL_CAPSULE | Freq: Every day | ORAL | 2 refills | Status: DC

## 2023-04-21 NOTE — Progress Notes (Signed)
BH MD/PA/NP OP Progress Note    Patient location; office Provider location; office  04/21/2023 10:54 AM Kelli Abbott  MRN:  [id number]  Chief Complaint:  Chief Complaint  Patient presents with   Follow-up   HPI:  Patient came for her follow-up appointment.  She is taking hydroxyzine which is helping her anxiety and sleep.  She is concerned about her 65 year old grandson who is not going to school in [place].  She feels sad because he was accepted for scholarship to play golf in [place] but he is not going to school.  Patient told her grandson is seeing a therapist but she believes it is his parents fault that he is not doing well.  Patient is tearful and emotional when she talks about her 30 year old grandson.  She is taking Cymbalta which is keeping her mood stable and denies any suicidal thoughts.  Recently she has seen her cancer doctor and now she is complaining of itching because cancer doctor changed some of the medication.  Her nightmares and dreams are better since started the hydroxyzine.  She complains of itching since 2 weeks when cancer medicines were changed.  She has a hard time getting a therapist even though we have provided list of therapy places.  Patient told she had left messages but no one call back and now she has planned to contact her insurance.  She continues to work 2 times a week.  She denies any mania, anger, hallucination or any paranoia.  Her appetite is okay.  Her weight is stable.  Visit Diagnosis:    ICD-10-CM   1. Anxiety  F41.9 DULoxetine (CYMBALTA) 30 MG capsule    hydrOXYzine (ATARAX) 10 MG tablet    2. PTSD (post-traumatic stress disorder)  F43.10 DULoxetine (CYMBALTA) 30 MG capsule    hydrOXYzine (ATARAX) 10 MG tablet       Past Psychiatric History:  No history of suicidal attempt, paranoia, hallucination. Given Wellbutrin but do not remember the details.   Past Medical History:  Past Medical History:  Diagnosis Date   Anemia    Arthritis     Osteoarthritis   Breast cancer (HCC)    Breast lump    Breast nodule    hypoechoic in right breast   Bursitis of shoulder region    Cancer [facility])    breast   Family history of breast cancer 11/06/2020   Family history of pancreatic cancer 11/06/2020   Hypertension    Mass on back    Personal history of radiation therapy 2009/2010   PONV (postoperative nausea and vomiting)    Skin lesions, generalized     Past Surgical History:  Procedure Laterality Date   BREAST BIOPSY Right 04/29/2008   malignant   BREAST BIOPSY Right 11/24/2015   benign   BREAST LUMPECTOMY Right 06/04/2008   BREAST LUMPECTOMY WITH NEEDLE LOCALIZATION Left 02/18/2021   Procedure: BREAST LUMPECTOMY WITH NEEDLE LOCALIZATION;  Surgeon: Earline Mayotte, MD;  Location: [facility] ORS;  Service: General;  Laterality: Left;  combined case with Dr. Ulice Bold   BREAST SURGERY  2009   R breast reexcision of margins   HYSTEROSCOPY WITH D & C N/A 11/06/2014   Procedure: DILATATION AND CURETTAGE /HYSTEROSCOPY with resection and myomectomy;  Surgeon: Geryl Rankins, MD;  Location: [facility];  Service: Gynecology;  Laterality: N/A;   MASS EXCISION N/A 01/17/2018   Procedure: EXCISION  OF UPPER BACK MASS AND SKIN TAGS;  Surgeon: Almond Lint, MD;  Location: [facility] OR;  Service: General;  Laterality: N/A;   TOTAL HIP ARTHROPLASTY Bilateral 04/2020   right   UNILATERAL BREAST REDUCTION Left 02/18/2021   Procedure: LEFT ONCOPLASTIC BREAST REDUCTION;  Surgeon: Peggye Form, DO;  Location: [facility] ORS;  Service: Plastics;  Laterality: Left;    Family Psychiatric History: Reviewed.  Family History:  Family History  Problem Relation Age of Onset   Hypertension Mother    Hypertension Father    Hypertension Brother    Breast cancer Paternal Aunt        dx before 49   Pancreatic cancer Paternal Grandfather        dx 44s    Social History:  Social History   Socioeconomic History   Marital status: Legally Separated     Spouse name: Not on file   Number of children: Not on file   Years of education: Not on file   Highest education level: Not on file  Occupational History   Not on file  Tobacco Use   Smoking status: Former    Current packs/day: 0.00    Average packs/day: 1 pack/day for 20.0 years (20.0 ttl pk-yrs)    Types: Cigarettes    Start date: 04/26/1988    Quit date: 04/26/2008    Years since quitting: 14.9   Smokeless tobacco: Never  Vaping Use   Vaping status: Never Used  Substance and Sexual Activity   Alcohol use: Not Currently   Drug use: No   Sexual activity: Not Currently  Other Topics Concern   Not on file  Social History Narrative   Not on file   Social Determinants of Health   Financial Resource Strain: Low Risk  (05/05/2020)   Received from [facility]   Overall Financial Resource Strain (CARDIA)    Difficulty of Paying Living Expenses: Not hard at all  Food Insecurity: No Food Insecurity (03/11/2022)   Received from [facility], [facility]   Hunger Vital Sign    Worried About Running Out of Food in the Last Year: Never true    Ran Out of Food in the Last Year: Never true  Transportation Needs: Not on file  Physical Activity: Not on file  Stress: No Stress Concern Present (11/26/2020)   Received from [facility], [facility]   [employer] of Occupational Health - Occupational Stress Questionnaire    Feeling of Stress : Not at all  Social Connections: Unknown (12/08/2021)   Received from [facility], [facility]   Social Network    Social Network: Not on file    Allergies:  Allergies  Allergen Reactions   Bupropion Hcl     Contributing to memory loss    Metabolic Disorder Labs: No results found for: "HGBA1C", "MPG" No results found for: "PROLACTIN" No results found for: "CHOL", "TRIG", "HDL", "CHOLHDL", "VLDL", "LDLCALC" No results found for: "TSH"  Therapeutic Level Labs: No results found for: "LITHIUM" No results found  for: "VALPROATE" No results found for: "CBMZ"  Current Medications: Current Outpatient Medications  Medication Sig Dispense Refill   acetaminophen (TYLENOL) 650 MG CR tablet Take 650 mg by mouth every 8 (eight) hours as needed for pain.     amLODipine (NORVASC) 5 MG tablet Take 5 mg by mouth daily.     calcium carbonate (OS-CAL) 1250 (500 Ca) MG chewable tablet Chew 1 tablet by mouth daily.     cholecalciferol (VITAMIN D) 1000 units tablet Take 1,000 Units by mouth daily.     DULoxetine (CYMBALTA) 30 MG capsule Take 1  capsule (30 mg total) by mouth daily. 30 capsule 2   ferrous sulfate 325 (65 FE) MG tablet Take 325 mg by mouth daily with breakfast.     HYDROcodone-acetaminophen (NORCO) 10-325 MG tablet Take 1-2 tablets by mouth every 6 (six) hours as needed for moderate pain or severe pain. 56 tablet 0   hydrOXYzine (ATARAX) 10 MG tablet Take 10-20 mg to take bed time. 30 tablet 0   irbesartan-hydrochlorothiazide (AVALIDE) 300-12.5 MG tablet Take 1 tablet by mouth daily.     letrozole (FEMARA) 2.5 MG tablet TAKE ONE TABLET BY MOUTH EVERY DAY 30 tablet 3   olopatadine (PATANOL) 0.1 % ophthalmic solution Place 1 drop into both eyes 2 (two) times daily as needed for allergies.     OZEMPIC, 0.25 OR 0.5 MG/DOSE, 2 MG/1.5ML SOPN Inject 0.5 mg into the skin every Thursday.     solifenacin (VESICARE) 10 MG tablet Take 10 mg by mouth daily.     No current facility-administered medications for this visit.     Musculoskeletal: Strength & Muscle Tone: within normal limits Gait & Station: normal Patient leans: N/A Psychiatric Specialty Exam: Physical Exam  Review of Systems  Blood pressure (!) 131/93, pulse 77, height 5\' 3"  (1.6 m), weight 257 lb (116.6 kg).There is no height or weight on file to calculate BMI.  General Appearance: Casual  Eye Contact:  Good  Speech:  Normal Rate  Volume:  Normal  Mood:  Anxious and tearful when she talked about her grandson  Affect:  Appropriate  Thought  Process:  Goal Directed  Orientation:  Full (Time, Place, and Person)  Thought Content:  Rumination  Suicidal Thoughts:  No  Homicidal Thoughts:  No  Memory:  Immediate;   Good Recent;   Fair Remote;   Fair  Judgement:  Intact  Insight:  Fair  Psychomotor Activity:  Decreased  Concentration:  Concentration: Fair and Attention Span: Fair  Recall:  Good  Fund of Knowledge:  Good  Language:  Good  Akathisia:  No  Handed:  Right  AIMS (if indicated):     Assets:  Communication Skills Desire for Improvement Housing Transportation  ADL's:  Intact  Cognition:  WNL  Sleep:   fair     Screenings: GAD-7    Flowsheet Row [facility]  Total GAD-7 Score 4      PHQ2-9    Flowsheet Row Office Visit from 10/14/2022 [facility]  PHQ-2 Total Score 2  PHQ-9 Total Score 5      Flowsheet Row Pre-Admission Testing 45 from 10/22/2021 in [facility] PRE [facility] TEST[facility] Admission (Discharged) from 02/18/2021 in [facility] PER[facility] Pre-Admission Testing 45 from 02/10/2021 in [facility] PRE ADMISSION TEST[facility]  C-SSRS RISK CATEGORY No Risk No Risk No Risk        Assessment and Plan: Patient doing better with the hydroxyzine.  She is sleeping better and her nightmares are not as bad.  I again emphasized that she need to see a therapist to help her family matter especially grandson stopped going to school and recently he had totaled his car.  She acknowledged and agreed he and she is not going to call the insurance company to find a therapist.  Continue Cymbalta 30 mg daily.  I also recommend she can take the low-dose hydroxyzine during the daytime if she is not working to help the itching but she will  also contact her oncologist if one of their medicine causing this issue.  Recommend to call us back if is any  question or any concern.  Follow-up in 3 months.  Collaboration of Care: Collaboration of Care: Other provider involved in patient's care AEB notes are available in epic to review.  Patient/Guardian was advised Release of Information must be obtained prior to any record release in order to collaborate their care with an outside provider. Patient/Guardian was advised if they have not already done so to contact the registration department to sign all necessary forms in order for [place] to release information regarding their care.   Consent: Patient/Guardian gives verbal consent for treatment and assignment of benefits for services provided during this visit. Patient/Guardian expressed understanding and agreed to proceed.   I provided 20 minutes face-to-face time during this encounter.  Cleotis Nipper, MD 04/21/2023, 10:54 AM

## 2023-05-02 DIAGNOSIS — M47816 Spondylosis without myelopathy or radiculopathy, lumbar region: Secondary | ICD-10-CM | POA: Diagnosis not present

## 2023-05-02 DIAGNOSIS — Z9181 History of falling: Secondary | ICD-10-CM | POA: Diagnosis not present

## 2023-05-02 DIAGNOSIS — R030 Elevated blood-pressure reading, without diagnosis of hypertension: Secondary | ICD-10-CM | POA: Diagnosis not present

## 2023-05-02 DIAGNOSIS — G894 Chronic pain syndrome: Secondary | ICD-10-CM | POA: Diagnosis not present

## 2023-05-02 DIAGNOSIS — Z79899 Other long term (current) drug therapy: Secondary | ICD-10-CM | POA: Diagnosis not present

## 2023-05-02 DIAGNOSIS — C50811 Malignant neoplasm of overlapping sites of right female breast: Secondary | ICD-10-CM | POA: Diagnosis not present

## 2023-05-02 DIAGNOSIS — M169 Osteoarthritis of hip, unspecified: Secondary | ICD-10-CM | POA: Diagnosis not present

## 2023-05-06 ENCOUNTER — Ambulatory Visit: Payer: Medicaid Other | Admitting: Oncology

## 2023-05-06 DIAGNOSIS — Z79899 Other long term (current) drug therapy: Secondary | ICD-10-CM | POA: Diagnosis not present

## 2023-05-10 ENCOUNTER — Inpatient Hospital Stay: Payer: 59 | Attending: Oncology | Admitting: Oncology

## 2023-05-10 ENCOUNTER — Encounter: Payer: Self-pay | Admitting: Oncology

## 2023-05-10 VITALS — BP 116/85 | HR 92 | Temp 97.8°F | Resp 18 | Wt 256.6 lb

## 2023-05-10 DIAGNOSIS — Z170 Estrogen receptor positive status [ER+]: Secondary | ICD-10-CM | POA: Diagnosis not present

## 2023-05-10 DIAGNOSIS — D0512 Intraductal carcinoma in situ of left breast: Secondary | ICD-10-CM | POA: Diagnosis not present

## 2023-05-10 DIAGNOSIS — C50411 Malignant neoplasm of upper-outer quadrant of right female breast: Secondary | ICD-10-CM | POA: Diagnosis not present

## 2023-05-10 DIAGNOSIS — Z79811 Long term (current) use of aromatase inhibitors: Secondary | ICD-10-CM | POA: Diagnosis not present

## 2023-05-10 DIAGNOSIS — Z87891 Personal history of nicotine dependence: Secondary | ICD-10-CM | POA: Insufficient documentation

## 2023-05-10 NOTE — Progress Notes (Signed)
Hematology/Oncology Consult note [facility]  Telephone:([phone] [phone]  Patient Care Team: Patient, No Pcp Per as PCP - General (General Practice) Pershing Proud, RN as Oncology Nurse Navigator Donnelly Angelica, RN as Oncology Nurse Navigator Merryl Hacker, NP as Nurse Practitioner (Nurse Practitioner) Creig Hines, MD as Consulting Physician (Oncology)   Name of the patient: Kelli Abbott  [id number]  [birth year]   Date of visit: 05/10/23  Diagnosis- -left breast DCIS ER positive   Chief complaint/ Reason for visit-routine follow-up of left breast DCIS  Heme/Onc history: Patient is a 67 year old female who was seen by Dr. Pamelia Hoit in [place] in July 2022 after her mammogram and biopsy was consistent with DCIS.  She was started on Arimidex at that time and subsequently saw Dr. Lemar Livings and underwentLumpectomy.  Her original mammogram in March 2022 showed broad area of calcifications 7.3 x 7.3 cm in the lower inner quadrant of the left breast.  Final pathology showed intermediate to high-grade DCIS with comedonecrosis anterior margin less than 2 mm and inferior unifocal margin positive.  She did not undergo reexcision and completed adjuvant radiation treatment and radiation boost to the positive margin.  Patient was subsequently switched over from Arimidex to letrozole as she had side effects from Arimidex     Interval history-tolerating letrozole well without any significant side effects.  She is also taking her calcium and vitamin D.  Denies any breast concerns today  ECOG PS- 1 Pain scale- 0   Review of systems- Review of Systems  Constitutional:  Negative for chills, fever, malaise/fatigue and weight loss.  HENT:  Negative for congestion, ear discharge and nosebleeds.   Eyes:  Negative for blurred vision.  Respiratory:  Negative for cough, hemoptysis, sputum production, shortness of breath and wheezing.   Cardiovascular:  Negative  for chest pain, palpitations, orthopnea and claudication.  Gastrointestinal:  Negative for abdominal pain, blood in stool, constipation, diarrhea, heartburn, melena, nausea and vomiting.  Genitourinary:  Negative for dysuria, flank pain, frequency, hematuria and urgency.  Musculoskeletal:  Negative for back pain, joint pain and myalgias.  Skin:  Negative for rash.  Neurological:  Negative for dizziness, tingling, focal weakness, seizures, weakness and headaches.  Endo/Heme/Allergies:  Does not bruise/bleed easily.  Psychiatric/Behavioral:  Negative for depression and suicidal ideas. The patient does not have insomnia.       Allergies  Allergen Reactions   Bupropion Hcl     Contributing to memory loss     Past Medical History:  Diagnosis Date   Anemia    Arthritis    Osteoarthritis   Breast cancer (HCC)    Breast lump    Breast nodule    hypoechoic in right breast   Bursitis of shoulder region    Cancer [facility])    breast   Family history of breast cancer 11/06/2020   Family history of pancreatic cancer 11/06/2020   Hypertension    Mass on back    Personal history of radiation therapy 2009/2010   PONV (postoperative nausea and vomiting)    Skin lesions, generalized      Past Surgical History:  Procedure Laterality Date   BREAST BIOPSY Right 04/29/2008   malignant   BREAST BIOPSY Right 11/24/2015   benign   BREAST LUMPECTOMY Right 06/04/2008   BREAST LUMPECTOMY WITH NEEDLE LOCALIZATION Left 02/18/2021   Procedure: BREAST LUMPECTOMY WITH NEEDLE LOCALIZATION;  Surgeon: Earline Mayotte, MD;  Location: [facility] ORS;  Service: General;  Laterality: Left;  combined case with Dr. Ulice Bold   BREAST SURGERY  2009   R breast reexcision of margins   HYSTEROSCOPY WITH D & C N/A 11/06/2014   Procedure: DILATATION AND CURETTAGE /HYSTEROSCOPY with resection and myomectomy;  Surgeon: Geryl Rankins, MD;  Location: [facility];  Service: Gynecology;  Laterality: N/A;   MASS EXCISION N/A  01/17/2018   Procedure: EXCISION  OF UPPER BACK MASS AND SKIN TAGS;  Surgeon: Almond Lint, MD;  Location: [facility] OR;  Service: General;  Laterality: N/A;   TOTAL HIP ARTHROPLASTY Bilateral 04/2020   right   UNILATERAL BREAST REDUCTION Left 02/18/2021   Procedure: LEFT ONCOPLASTIC BREAST REDUCTION;  Surgeon: Peggye Form, DO;  Location: [facility] ORS;  Service: Plastics;  Laterality: Left;    Social History   Socioeconomic History   Marital status: Legally Separated    Spouse name: Not on file   Number of children: Not on file   Years of education: Not on file   Highest education level: Not on file  Occupational History   Not on file  Tobacco Use   Smoking status: Former    Current packs/day: 0.00    Average packs/day: 1 pack/day for 20.0 years (20.0 ttl pk-yrs)    Types: Cigarettes    Start date: 04/26/1988    Quit date: 04/26/2008    Years since quitting: 15.0   Smokeless tobacco: Never  Vaping Use   Vaping status: Never Used  Substance and Sexual Activity   Alcohol use: Not Currently   Drug use: No   Sexual activity: Not Currently  Other Topics Concern   Not on file  Social History Narrative   Not on file   Social Determinants of Health   Financial Resource Strain: Low Risk  (05/05/2020)   Received [facility], [facility]   Overall Financial Resource Strain (CARDIA)    Difficulty of Paying Living Expenses: Not hard at all  Food Insecurity: No Food Insecurity (03/11/2022)   Received from [facility], [facility]   Hunger Vital Sign    Worried About Running Out of Food in the Last Year: Never true    Ran Out of Food in the Last Year: Never true  Transportation Needs: Not on file  Physical Activity: Not on file  Stress: No Stress Concern Present (11/26/2020)   Received from [facility], [facility]   [employer] of Occupational Health - Occupational Stress Questionnaire    Feeling of Stress : Not at all  Social Connections: Unknown (12/08/2021)    Received from [facility], [facility]   Social Network    Social Network: Not on file  Intimate Partner Violence: Unknown (10/29/2021)   Received from [facility], [facility]   HITS    Physically Hurt: Not on file    Insult or Talk Down To: Not on file    Threaten Physical Harm: Not on file    Scream or Curse: Not on file    Family History  Problem Relation Age of Onset   Hypertension Mother    Hypertension Father    Hypertension Brother    Breast cancer Paternal Aunt        dx before 65   Pancreatic cancer Paternal Grandfather        dx 68s     Current Outpatient Medications:    acetaminophen (TYLENOL) 650 MG CR tablet, Take 650 mg by mouth every 8 (eight) hours as needed for pain., Disp: , Rfl:    amLODipine (NORVASC) 5  MG tablet, Take 5 mg by mouth daily., Disp: , Rfl:    calcium carbonate (OS-CAL) 1250 (500 Ca) MG chewable tablet, Chew 1 tablet by mouth daily., Disp: , Rfl:    cholecalciferol (VITAMIN D) 1000 units tablet, Take 1,000 Units by mouth daily., Disp: , Rfl:    DULoxetine (CYMBALTA) 30 MG capsule, Take 1 capsule (30 mg total) by mouth daily., Disp: 30 capsule, Rfl: 2   ferrous sulfate 325 (65 FE) MG tablet, Take 325 mg by mouth daily with breakfast., Disp: , Rfl:    HYDROcodone-acetaminophen (NORCO) 10-325 MG tablet, Take 1-2 tablets by mouth every 6 (six) hours as needed for moderate pain or severe pain., Disp: 56 tablet, Rfl: 0   hydrOXYzine (ATARAX) 10 MG tablet, Take 10-20 mg to take bed time., Disp: 30 tablet, Rfl: 1   irbesartan-hydrochlorothiazide (AVALIDE) 300-12.5 MG tablet, Take 1 tablet by mouth daily., Disp: , Rfl:    letrozole (FEMARA) 2.5 MG tablet, TAKE ONE TABLET BY MOUTH EVERY DAY, Disp: 30 tablet, Rfl: 3   olopatadine (PATANOL) 0.1 % ophthalmic solution, Place 1 drop into both eyes 2 (two) times daily as needed for allergies., Disp: , Rfl:    OZEMPIC, 0.25 OR 0.5 MG/DOSE, 2 MG/1.5ML SOPN, Inject 0.5 mg into the skin every Thursday.,  Disp: , Rfl:    solifenacin (VESICARE) 10 MG tablet, Take 10 mg by mouth daily., Disp: , Rfl:   Physical exam:  Vitals:   05/10/23 1509  BP: 116/85  Pulse: 92  Resp: 18  Temp: 97.8 F (36.6 C)  TempSrc: Tympanic  SpO2: 96%  Weight: 256 lb 9.6 oz (116.4 kg)   Physical Exam Cardiovascular:     Rate and Rhythm: Normal rate and regular rhythm.     Heart sounds: Normal heart sounds.  Pulmonary:     Effort: Pulmonary effort is normal.     Breath sounds: Normal breath sounds.  Skin:    General: Skin is warm and dry.  Neurological:     Mental Status: She is alert and oriented to person, place, and time.    Breast exam was performed in seated and lying down position. Patient is status post left lumpectomy with a well-healed surgical scar. No evidence of any palpable masses. No evidence of axillary adenopathy. No evidence of any palpable masses or lumps in the right breast. No evidence of right axillary adenopathy      Latest Ref Rng & Units 01/05/2018   10:12 AM  CMP  Glucose 65 - 99 mg/dL 94   BUN 6 - 20 mg/dL 8   Creatinine 4.40 - 3.47 mg/dL 4.25   Sodium 956 - 387 mmol/L 140   Potassium 3.5 - 5.1 mmol/L 4.0   Chloride 101 - 111 mmol/L 105   CO2 22 - 32 mmol/L 28   Calcium 8.9 - 10.3 mg/dL 56.4       Latest Ref Rng & Units 01/11/2022    1:46 PM  CBC  WBC 4.0 - 10.5 K/uL 10.2   Hemoglobin 12.0 - 15.0 g/dL 33.2   Hematocrit 95.1 - 46.0 % 43.6   Platelets 150 - 400 K/uL 297      Assessment and plan- Patient is a 67 y.o. female with left breast DCIS ER positive currently on letrozole here for routine follow-up  Patient is tolerating letrozole calcium and vitamin D well without any significant side effects.  Clinically she is doing well with no concerning signs and symptoms of recurrence based on today's exam.  Her bone density scan from 2 years ago was normal.  I will see her back in 6 months no labs with a bone density scan prior   Visit Diagnosis 1. Ductal carcinoma  in situ (DCIS) of left breast   2. Use of letrozole (Femara)   3. Malignant neoplasm of upper-outer quadrant of right breast in female, estrogen receptor positive (HCC)      Dr. Owens Shark, MD, MPH [facility] [phone] 05/10/2023 4:28 PM

## 2023-05-19 ENCOUNTER — Other Ambulatory Visit: Payer: Self-pay | Admitting: Oncology

## 2023-05-19 ENCOUNTER — Other Ambulatory Visit (HOSPITAL_COMMUNITY): Payer: Self-pay | Admitting: Psychiatry

## 2023-05-19 DIAGNOSIS — F419 Anxiety disorder, unspecified: Secondary | ICD-10-CM

## 2023-05-19 DIAGNOSIS — F4310 Post-traumatic stress disorder, unspecified: Secondary | ICD-10-CM

## 2023-06-02 DIAGNOSIS — M169 Osteoarthritis of hip, unspecified: Secondary | ICD-10-CM | POA: Diagnosis not present

## 2023-06-02 DIAGNOSIS — M47816 Spondylosis without myelopathy or radiculopathy, lumbar region: Secondary | ICD-10-CM | POA: Diagnosis not present

## 2023-06-02 DIAGNOSIS — G894 Chronic pain syndrome: Secondary | ICD-10-CM | POA: Diagnosis not present

## 2023-06-02 DIAGNOSIS — Z23 Encounter for immunization: Secondary | ICD-10-CM | POA: Diagnosis not present

## 2023-06-02 DIAGNOSIS — E538 Deficiency of other specified B group vitamins: Secondary | ICD-10-CM | POA: Diagnosis not present

## 2023-06-02 DIAGNOSIS — Z79899 Other long term (current) drug therapy: Secondary | ICD-10-CM | POA: Diagnosis not present

## 2023-06-02 DIAGNOSIS — Z131 Encounter for screening for diabetes mellitus: Secondary | ICD-10-CM | POA: Diagnosis not present

## 2023-06-06 DIAGNOSIS — Z136 Encounter for screening for cardiovascular disorders: Secondary | ICD-10-CM | POA: Diagnosis not present

## 2023-06-06 DIAGNOSIS — C50912 Malignant neoplasm of unspecified site of left female breast: Secondary | ICD-10-CM | POA: Diagnosis not present

## 2023-06-06 DIAGNOSIS — Z0000 Encounter for general adult medical examination without abnormal findings: Secondary | ICD-10-CM | POA: Diagnosis not present

## 2023-06-06 DIAGNOSIS — Z131 Encounter for screening for diabetes mellitus: Secondary | ICD-10-CM | POA: Diagnosis not present

## 2023-06-06 DIAGNOSIS — Z853 Personal history of malignant neoplasm of breast: Secondary | ICD-10-CM | POA: Diagnosis not present

## 2023-06-06 DIAGNOSIS — Z23 Encounter for immunization: Secondary | ICD-10-CM | POA: Diagnosis not present

## 2023-06-06 DIAGNOSIS — I10 Essential (primary) hypertension: Secondary | ICD-10-CM | POA: Diagnosis not present

## 2023-06-14 DIAGNOSIS — M169 Osteoarthritis of hip, unspecified: Secondary | ICD-10-CM | POA: Diagnosis not present

## 2023-06-14 DIAGNOSIS — R0602 Shortness of breath: Secondary | ICD-10-CM | POA: Diagnosis not present

## 2023-06-14 DIAGNOSIS — Z79899 Other long term (current) drug therapy: Secondary | ICD-10-CM | POA: Diagnosis not present

## 2023-06-14 DIAGNOSIS — G894 Chronic pain syndrome: Secondary | ICD-10-CM | POA: Diagnosis not present

## 2023-06-14 DIAGNOSIS — M47816 Spondylosis without myelopathy or radiculopathy, lumbar region: Secondary | ICD-10-CM | POA: Diagnosis not present

## 2023-06-16 ENCOUNTER — Other Ambulatory Visit (HOSPITAL_COMMUNITY): Payer: Self-pay | Admitting: Psychiatry

## 2023-06-16 DIAGNOSIS — F419 Anxiety disorder, unspecified: Secondary | ICD-10-CM

## 2023-06-16 DIAGNOSIS — F4310 Post-traumatic stress disorder, unspecified: Secondary | ICD-10-CM

## 2023-07-01 DIAGNOSIS — M169 Osteoarthritis of hip, unspecified: Secondary | ICD-10-CM | POA: Diagnosis not present

## 2023-07-01 DIAGNOSIS — Z79899 Other long term (current) drug therapy: Secondary | ICD-10-CM | POA: Diagnosis not present

## 2023-07-01 DIAGNOSIS — M47816 Spondylosis without myelopathy or radiculopathy, lumbar region: Secondary | ICD-10-CM | POA: Diagnosis not present

## 2023-07-01 DIAGNOSIS — G894 Chronic pain syndrome: Secondary | ICD-10-CM | POA: Diagnosis not present

## 2023-07-15 ENCOUNTER — Other Ambulatory Visit (HOSPITAL_COMMUNITY): Payer: Self-pay | Admitting: Psychiatry

## 2023-07-15 DIAGNOSIS — F4310 Post-traumatic stress disorder, unspecified: Secondary | ICD-10-CM

## 2023-07-15 DIAGNOSIS — F419 Anxiety disorder, unspecified: Secondary | ICD-10-CM

## 2023-07-21 ENCOUNTER — Ambulatory Visit (HOSPITAL_COMMUNITY): Payer: 59 | Admitting: Psychiatry

## 2023-08-02 ENCOUNTER — Ambulatory Visit
Admission: RE | Admit: 2023-08-02 | Discharge: 2023-08-02 | Disposition: A | Discharge status: Home / Self Care | Payer: 59 | Source: Ambulatory Visit | Attending: Oncology | Admitting: Oncology

## 2023-08-02 DIAGNOSIS — M85832 Other specified disorders of bone density and structure, left forearm: Secondary | ICD-10-CM | POA: Diagnosis not present

## 2023-08-02 DIAGNOSIS — C50411 Malignant neoplasm of upper-outer quadrant of right female breast: Secondary | ICD-10-CM | POA: Insufficient documentation

## 2023-08-02 DIAGNOSIS — G894 Chronic pain syndrome: Secondary | ICD-10-CM | POA: Diagnosis not present

## 2023-08-02 DIAGNOSIS — Z79811 Long term (current) use of aromatase inhibitors: Secondary | ICD-10-CM | POA: Diagnosis not present

## 2023-08-02 DIAGNOSIS — D0512 Intraductal carcinoma in situ of left breast: Secondary | ICD-10-CM | POA: Insufficient documentation

## 2023-08-02 DIAGNOSIS — M169 Osteoarthritis of hip, unspecified: Secondary | ICD-10-CM | POA: Diagnosis not present

## 2023-08-02 DIAGNOSIS — Z79899 Other long term (current) drug therapy: Secondary | ICD-10-CM | POA: Diagnosis not present

## 2023-08-02 DIAGNOSIS — Z170 Estrogen receptor positive status [ER+]: Secondary | ICD-10-CM | POA: Diagnosis not present

## 2023-08-02 DIAGNOSIS — M47816 Spondylosis without myelopathy or radiculopathy, lumbar region: Secondary | ICD-10-CM | POA: Diagnosis not present

## 2023-08-02 DIAGNOSIS — Z780 Asymptomatic menopausal state: Secondary | ICD-10-CM | POA: Diagnosis not present

## 2023-08-25 ENCOUNTER — Encounter (HOSPITAL_COMMUNITY): Payer: Self-pay | Admitting: Psychiatry

## 2023-08-25 ENCOUNTER — Ambulatory Visit (HOSPITAL_BASED_OUTPATIENT_CLINIC_OR_DEPARTMENT_OTHER): Payer: 59 | Admitting: Psychiatry

## 2023-08-25 VITALS — BP 143/97 | HR 85 | Ht 59.0 in | Wt 255.0 lb

## 2023-08-25 DIAGNOSIS — F4310 Post-traumatic stress disorder, unspecified: Secondary | ICD-10-CM

## 2023-08-25 DIAGNOSIS — F419 Anxiety disorder, unspecified: Secondary | ICD-10-CM | POA: Diagnosis not present

## 2023-08-25 MED ORDER — DULOXETINE HCL 30 MG PO CPEP: 30.0000 mg | ORAL_CAPSULE | Freq: Every day | ORAL | 2 refills | Status: DC

## 2023-08-25 MED ORDER — HYDROXYZINE HCL 10 MG PO TABS
ORAL_TABLET | ORAL | 2 refills | Status: DC
Start: 2023-08-25 — End: 2024-03-01

## 2023-08-25 NOTE — Progress Notes (Signed)
[facility] MD/PA/NP OP Progress Note    Patient location; office Provider location; office  08/25/2023 10:41 AM Kelli Abbott  MRN:  [id number]  Chief Complaint:  Chief Complaint  Patient presents with   Follow-up   HPI: Patient came today for her follow-up appointment.  She reported things are going okay but she worried about her daughter and grandkids.  She was alone around Christmas but kids came with patient's daughter on 15th.  She had a good time.  Patient told her 21 year old grandson does not want to go to school and now working at [employer].  Patient now have a dog and she admitted sometimes feeling overwhelmed because trying to train the dog and a lot of responsibilities.  She needed a letter because place where she is living require documentation to keep the animal.  Patient told there are some other senior citizen at that place to have the dogs.  She feels her comparing of the dog's very comforting.  She feels more safe and it helps emotionally.  She is sleeping okay.  She takes the hydroxyzine 10 mg every night that helped her sleep.  She denies any panic attack, crying spells, feeling of hopelessness or worthlessness.  She denies any nightmares or flashbacks.  Her appetite is okay.  She is taking Cymbalta which is keeping her mood stable.  Her weight is unchanged from the past.  She continues to work 2 times a week but now she is concerned as it may jeopardize her Medicaid and she may need to stop working.  She wants to continue current medication.  Visit Diagnosis:    ICD-10-CM   1. PTSD (post-traumatic stress disorder)  F43.10 DULoxetine (CYMBALTA) 30 MG capsule    hydrOXYzine (ATARAX) 10 MG tablet    2. Anxiety  F41.9 DULoxetine (CYMBALTA) 30 MG capsule    hydrOXYzine (ATARAX) 10 MG tablet        Past Psychiatric History:  No history of suicidal attempt, paranoia, hallucination. Given Wellbutrin but do not remember the details.   Past Medical History:  Past  Medical History:  Diagnosis Date   Anemia    Arthritis    Osteoarthritis   Breast cancer (HCC)    Breast lump    Breast nodule    hypoechoic in right breast   Bursitis of shoulder region    Cancer [facility])    breast   Family history of breast cancer 11/06/2020   Family history of pancreatic cancer 11/06/2020   Hypertension    Mass on back    Personal history of radiation therapy 2009/2010   PONV (postoperative nausea and vomiting)    Skin lesions, generalized     Past Surgical History:  Procedure Laterality Date   BREAST BIOPSY Right 04/29/2008   malignant   BREAST BIOPSY Right 11/24/2015   benign   BREAST LUMPECTOMY Right 06/04/2008   BREAST LUMPECTOMY WITH NEEDLE LOCALIZATION Left 02/18/2021   Procedure: BREAST LUMPECTOMY WITH NEEDLE LOCALIZATION;  Surgeon: Earline Mayotte, MD;  Location: [facility] ORS;  Service: General;  Laterality: Left;  combined case with Dr. Ulice Bold   BREAST SURGERY  2009   R breast reexcision of margins   HYSTEROSCOPY WITH D & C N/A 11/06/2014   Procedure: DILATATION AND CURETTAGE /HYSTEROSCOPY with resection and myomectomy;  Surgeon: Geryl Rankins, MD;  Location: [facility];  Service: Gynecology;  Laterality: N/A;   MASS EXCISION N/A 01/17/2018   Procedure: EXCISION  OF UPPER BACK MASS AND SKIN TAGS;  Surgeon: Donell Beers,  Darrick Huntsman, MD;  Location: [facility] OR;  Service: General;  Laterality: N/A;   TOTAL HIP ARTHROPLASTY Bilateral 04/2020   right   UNILATERAL BREAST REDUCTION Left 02/18/2021   Procedure: LEFT ONCOPLASTIC BREAST REDUCTION;  Surgeon: Peggye Form, DO;  Location: [facility] ORS;  Service: Plastics;  Laterality: Left;    Family Psychiatric History: Reviewed.  Family History:  Family History  Problem Relation Age of Onset   Hypertension Mother    Hypertension Father    Hypertension Brother    Breast cancer Paternal Aunt        dx before 28   Pancreatic cancer Paternal Grandfather        dx 19s    Social History:  Social History    Socioeconomic History   Marital status: Legally Separated    Spouse name: Not on file   Number of children: Not on file   Years of education: Not on file   Highest education level: Not on file  Occupational History   Not on file  Tobacco Use   Smoking status: Former    Current packs/day: 0.00    Average packs/day: 1 pack/day for 20.0 years (20.0 ttl pk-yrs)    Types: Cigarettes    Start date: 04/26/1988    Quit date: 04/26/2008    Years since quitting: 15.3   Smokeless tobacco: Never  Vaping Use   Vaping status: Never Used  Substance and Sexual Activity   Alcohol use: Not Currently   Drug use: No   Sexual activity: Not Currently  Other Topics Concern   Not on file  Social History Narrative   Not on file   Social Drivers of Health   Financial Resource Strain: Low Risk  (05/05/2020)   Received from [facility], [facility]   Overall Financial Resource Strain (CARDIA)    Difficulty of Paying Living Expenses: Not hard at all  Food Insecurity: No Food Insecurity (03/11/2022)   Received from [facility], [facility]   Hunger Vital Sign    Worried About Running Out of Food in the Last Year: Never true    Ran Out of Food in the Last Year: Never true  Transportation Needs: Not on file  Physical Activity: Not on file  Stress: No Stress Concern Present (11/26/2020)   Received from [facility], [facility]   [employer] of Occupational Health - Occupational Stress Questionnaire    Feeling of Stress : Not at all  Social Connections: Unknown (12/08/2021)   Received from [facility], [facility]   Social Network    Social Network: Not on file    Allergies:  Allergies  Allergen Reactions   Bupropion Hcl     Contributing to memory loss    Metabolic Disorder Labs: No results found for: "HGBA1C", "MPG" No results found for: "PROLACTIN" No results found for: "CHOL", "TRIG", "HDL", "CHOLHDL", "VLDL", "LDLCALC" No results found for: "TSH"  Therapeutic  Level Labs: No results found for: "LITHIUM" No results found for: "VALPROATE" No results found for: "CBMZ"  Current Medications: Current Outpatient Medications  Medication Sig Dispense Refill   acetaminophen (TYLENOL) 650 MG CR tablet Take 650 mg by mouth every 8 (eight) hours as needed for pain.     amLODipine (NORVASC) 5 MG tablet Take 5 mg by mouth daily.     calcium carbonate (OS-CAL) 1250 (500 Ca) MG chewable tablet Chew 1 tablet by mouth daily.     cholecalciferol (VITAMIN D) 1000 units tablet Take 1,000 Units by mouth daily.  DULoxetine (CYMBALTA) 30 MG capsule Take 1 capsule (30 mg total) by mouth daily. 30 capsule 2   ferrous sulfate 325 (65 FE) MG tablet Take 325 mg by mouth daily with breakfast.     HYDROcodone-acetaminophen (NORCO) 10-325 MG tablet Take 1-2 tablets by mouth every 6 (six) hours as needed for moderate pain or severe pain. 56 tablet 0   hydrOXYzine (ATARAX) 10 MG tablet Take 1-2 tablets (10-20 mg) to take bed time. 30 tablet 1   irbesartan-hydrochlorothiazide (AVALIDE) 300-12.5 MG tablet Take 1 tablet by mouth daily.     letrozole (FEMARA) 2.5 MG tablet Take 1 tablet (2.5 mg total) by mouth daily. 30 tablet 3   olopatadine (PATANOL) 0.1 % ophthalmic solution Place 1 drop into both eyes 2 (two) times daily as needed for allergies.     OZEMPIC, 0.25 OR 0.5 MG/DOSE, 2 MG/1.5ML SOPN Inject 0.5 mg into the skin every Thursday.     solifenacin (VESICARE) 10 MG tablet Take 10 mg by mouth daily.     No current facility-administered medications for this visit.     Musculoskeletal: Strength & Muscle Tone: within normal limits Gait & Station: normal Patient leans: N/A  Psychiatric Specialty Exam: Physical Exam  Review of Systems  Blood pressure (!) 143/97, pulse 85, height 4\' 11"  (1.499 m), weight 255 lb (115.7 kg).There is no height or weight on file to calculate BMI.  General Appearance: Casual  Eye Contact:  Fair  Speech:  Normal Rate  Volume:  Normal   Mood:  Anxious  Affect:  Labile  Thought Process:  Descriptions of Associations: Intact  Orientation:  Full (Time, Place, and Person)  Thought Content:  Rumination  Suicidal Thoughts:  No  Homicidal Thoughts:  No  Memory:  Immediate;   Good Recent;   Fair Remote;   Fair  Judgement:  Intact  Insight:  Present  Psychomotor Activity:  Normal  Concentration:  Concentration: Fair and Attention Span: Fair  Recall:  Good  Fund of Knowledge:  Good  Language:  Good  Akathisia:  No  Handed:  Right  AIMS (if indicated):     Assets:  Communication Skills Desire for Improvement Housing Transportation  ADL's:  Intact  Cognition:  WNL  Sleep:   ok     Screenings: GAD-7    Flowsheet Row [facility]  Total GAD-7 Score 4      PHQ2-9    Flowsheet Row Office Visit from 10/14/2022 in [facility]  PHQ-2 Total Score 2  PHQ-9 Total Score 5      Flowsheet Row Pre-Admission Testing 45 from 10/22/2021 in [facility] Admission (Discharged) from 02/18/2021 in [facility] PER[facility] Pre-Admission Testing 45 from 02/10/2021 in [facility] PRE [facility]MISSION TEST[facility]  C-SSRS RISK CATEGORY No Risk No Risk No Risk        Assessment and Plan:  Discussed stress related to her losing insurance if she continues to work 2 times a day.  Reassurance given.  She like to have a letter so she can keep her dog which is helping her emotionally and very comforting.  Discussed stress related to her family which is chronic but she is handling situation better than before.  She has not started therapy yet but going to call insurance company to find a therapist nearby.  Will continue Cymbalta 30 mg daily and hydroxyzine 10 mg at bedtime.  Encouraged  to call us back if she has any question or any concern.  Follow-up in 3  months.  Collaboration of Care: Collaboration of Care: Other provider involved in patient's care AEB notes are available in epic to review.  Patient/Guardian was advised Release of Information must be obtained prior to any record release in order to collaborate their care with an outside provider. Patient/Guardian was advised if they have not already done so to contact the registration department to sign all necessary forms in order for [place] to release information regarding their care.   Consent: Patient/Guardian gives verbal consent for treatment and assignment of benefits for services provided during this visit. Patient/Guardian expressed understanding and agreed to proceed.   I provided 24 minutes face-to-face time during this encounter.  Cleotis Nipper, MD 08/25/2023, 10:41 AM

## 2023-09-02 DIAGNOSIS — Z79899 Other long term (current) drug therapy: Secondary | ICD-10-CM | POA: Diagnosis not present

## 2023-09-02 DIAGNOSIS — Z0000 Encounter for general adult medical examination without abnormal findings: Secondary | ICD-10-CM | POA: Diagnosis not present

## 2023-09-02 DIAGNOSIS — M169 Osteoarthritis of hip, unspecified: Secondary | ICD-10-CM | POA: Diagnosis not present

## 2023-09-02 DIAGNOSIS — G894 Chronic pain syndrome: Secondary | ICD-10-CM | POA: Diagnosis not present

## 2023-09-02 DIAGNOSIS — M47816 Spondylosis without myelopathy or radiculopathy, lumbar region: Secondary | ICD-10-CM | POA: Diagnosis not present

## 2023-09-16 ENCOUNTER — Other Ambulatory Visit: Payer: Self-pay | Admitting: Oncology

## 2023-09-30 DIAGNOSIS — M169 Osteoarthritis of hip, unspecified: Secondary | ICD-10-CM | POA: Diagnosis not present

## 2023-09-30 DIAGNOSIS — Z79899 Other long term (current) drug therapy: Secondary | ICD-10-CM | POA: Diagnosis not present

## 2023-09-30 DIAGNOSIS — M47816 Spondylosis without myelopathy or radiculopathy, lumbar region: Secondary | ICD-10-CM | POA: Diagnosis not present

## 2023-09-30 DIAGNOSIS — G894 Chronic pain syndrome: Secondary | ICD-10-CM | POA: Diagnosis not present

## 2023-10-03 ENCOUNTER — Other Ambulatory Visit: Payer: Self-pay

## 2023-10-03 ENCOUNTER — Encounter (HOSPITAL_COMMUNITY): Payer: Self-pay

## 2023-10-03 ENCOUNTER — Emergency Department (HOSPITAL_COMMUNITY)
Admission: EM | Admit: 2023-10-03 | Discharge: 2023-10-03 | Disposition: A | Discharge status: Home / Self Care | Attending: Emergency Medicine | Admitting: Emergency Medicine

## 2023-10-03 ENCOUNTER — Emergency Department (HOSPITAL_COMMUNITY)

## 2023-10-03 DIAGNOSIS — Z043 Encounter for examination and observation following other accident: Secondary | ICD-10-CM | POA: Diagnosis not present

## 2023-10-03 DIAGNOSIS — M47816 Spondylosis without myelopathy or radiculopathy, lumbar region: Secondary | ICD-10-CM | POA: Diagnosis not present

## 2023-10-03 DIAGNOSIS — W19XXXA Unspecified fall, initial encounter: Secondary | ICD-10-CM | POA: Diagnosis not present

## 2023-10-03 DIAGNOSIS — M25552 Pain in left hip: Secondary | ICD-10-CM | POA: Insufficient documentation

## 2023-10-03 DIAGNOSIS — Z96643 Presence of artificial hip joint, bilateral: Secondary | ICD-10-CM | POA: Diagnosis not present

## 2023-10-03 DIAGNOSIS — S79912A Unspecified injury of left hip, initial encounter: Secondary | ICD-10-CM | POA: Diagnosis not present

## 2023-10-03 DIAGNOSIS — Y9301 Activity, walking, marching and hiking: Secondary | ICD-10-CM | POA: Diagnosis not present

## 2023-10-03 DIAGNOSIS — S7002XA Contusion of left hip, initial encounter: Secondary | ICD-10-CM

## 2023-10-03 DIAGNOSIS — W0110XA Fall on same level from slipping, tripping and stumbling with subsequent striking against unspecified object, initial encounter: Secondary | ICD-10-CM | POA: Insufficient documentation

## 2023-10-03 DIAGNOSIS — M79662 Pain in left lower leg: Secondary | ICD-10-CM | POA: Diagnosis not present

## 2023-10-03 DIAGNOSIS — S80812A Abrasion, left lower leg, initial encounter: Secondary | ICD-10-CM | POA: Insufficient documentation

## 2023-10-03 DIAGNOSIS — I878 Other specified disorders of veins: Secondary | ICD-10-CM | POA: Diagnosis not present

## 2023-10-03 DIAGNOSIS — I10 Essential (primary) hypertension: Secondary | ICD-10-CM | POA: Diagnosis not present

## 2023-10-03 MED ORDER — OXYCODONE-ACETAMINOPHEN 5-325 MG PO TABS
1.0000 | ORAL_TABLET | Freq: Once | ORAL | Status: AC
  Administered 2023-10-03: 1 via ORAL
  Filled 2023-10-03: qty 1

## 2023-10-03 NOTE — ED Provider Notes (Signed)
 [place] EMERGENCY DEPARTMENT AT [facility] Provider Note   CSN: [id number] Arrival date & time: 10/03/23  1832     History  Chief Complaint  Patient presents with   Marletta Lor    Kelli Abbott is a 68 y.o. female.  68 year old female presents with left hip pain and left lower extremity pain after mechanical fall just prior to arrival.  Denies any LOC.  Denies any lower back pain.  No distal numbness tingling to her left foot.  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  Was able to ambulate after she fell.  Complains of sharp pain is worse with movement to her left hip does not radiate to her groin.  Presents via EMS       Home Medications Prior to Admission medications   Medication Sig Start Date End Date Taking? Authorizing Provider  acetaminophen (TYLENOL) 650 MG CR tablet Take 650 mg by mouth every 8 (eight) hours as needed for pain.    [provider]  amLODipine (NORVASC) 5 MG tablet Take 5 mg by mouth daily. 10/04/22   [provider]  calcium carbonate (OS-CAL) 1250 (500 Ca) MG chewable tablet Chew 1 tablet by mouth daily.    [provider]  cholecalciferol (VITAMIN D) 1000 units tablet Take 1,000 Units by mouth daily.    [provider]  DULoxetine (CYMBALTA) 30 MG capsule Take 1 capsule (30 mg total) by mouth daily. 08/25/23 11/23/23  Arfeen, Phillips Grout, MD  ferrous sulfate 325 (65 FE) MG tablet Take 325 mg by mouth daily with breakfast.    [provider]  HYDROcodone-acetaminophen (NORCO) 10-325 MG tablet Take 1-2 tablets by mouth every 6 (six) hours as needed for moderate pain or severe pain. 01/17/18   Almond Lint, MD  hydrOXYzine (ATARAX) 10 MG tablet Take 1-2 tablets (10-20 mg) to take bed time. 08/25/23   Arfeen, Phillips Grout, MD  irbesartan-hydrochlorothiazide (AVALIDE) 300-12.5 MG tablet Take 1 tablet by mouth daily.    [provider]  letrozole (FEMARA) 2.5 MG tablet Take  1 tablet (2.5 mg total) by mouth daily. 09/16/23   Creig Hines, MD  olopatadine (PATANOL) 0.1 % ophthalmic solution Place 1 drop into both eyes 2 (two) times daily as needed for allergies.    [provider]  OZEMPIC, 0.25 OR 0.5 MG/DOSE, 2 MG/1.5ML SOPN Inject 0.5 mg into the skin every Thursday. 12/02/20   [provider]  solifenacin (VESICARE) 10 MG tablet Take 10 mg by mouth daily.    [provider]      Allergies    Bupropion hcl    Review of Systems   Review of Systems  All other systems reviewed and are negative.   Physical Exam Updated Vital Signs BP (!) 142/112   Pulse (!) 104   Temp 98.3 F (36.8 C) (Oral)   Resp 17   Ht 1.549 m (5\' 1" )   Wt 108.9 kg   SpO2 100%   BMI 45.35 kg/m  Physical Exam Vitals and nursing note reviewed.  Constitutional:      General: She is not in acute distress.    Appearance: Normal appearance. She is well-developed. She is not toxic-appearing.  HENT:     Head: Normocephalic and atraumatic.  Eyes:     General: Lids are normal.     Conjunctiva/sclera: Conjunctivae normal.     Pupils: Pupils are equal, round, and reactive to light.  Neck:  Thyroid: No thyroid mass.     Trachea: No tracheal deviation.  Cardiovascular:     Rate and Rhythm: Normal rate and regular rhythm.     Heart sounds: Normal heart sounds. No murmur heard.    No gallop.  Pulmonary:     Effort: Pulmonary effort is normal. No respiratory distress.     Breath sounds: Normal breath sounds. No stridor. No decreased breath sounds, wheezing, rhonchi or rales.  Abdominal:     General: There is no distension.     Palpations: Abdomen is soft.     Tenderness: There is no abdominal tenderness. There is no rebound.  Musculoskeletal:        General: No tenderness. Normal range of motion.     Cervical back: Normal range of motion and neck supple.       Legs:     Comments: No shortening or rotation of the left lower extremity noted.  Neurovasc  intact at left foot  Skin:    General: Skin is warm and dry.     Findings: No abrasion or rash.  Neurological:     Mental Status: She is alert and oriented to person, place, and time. Mental status is at baseline.     GCS: GCS eye subscore is 4. GCS verbal subscore is 5. GCS motor subscore is 6.     Cranial Nerves: No cranial nerve deficit.     Sensory: No sensory deficit.     Motor: Motor function is intact.  Psychiatric:        Attention and Perception: Attention normal.        Speech: Speech normal.        Behavior: Behavior normal.     ED Results / Procedures / Treatments   Labs (all labs ordered are listed, but only abnormal results are displayed) Labs Reviewed - No data to display  EKG None  Radiology No results found.  Procedures Procedures    Medications Ordered in ED Medications  oxyCODONE-acetaminophen (PERCOCET/ROXICET) 5-325 MG per tablet 1 tablet (has no administration in time range)    ED Course/ Medical Decision Making/ A&P                                 Medical Decision Making Amount and/or Complexity of Data Reviewed Radiology: ordered.  Risk Prescription drug management.   Patient here after mechanical fall and x-ray of her left hip and pelvis as well as her left tib-fib were negative.  Will be discharged home        Final Clinical Impression(s) / ED Diagnoses Final diagnoses:  None    Rx / DC Orders ED Discharge Orders     None         Lorre Nick, MD 10/03/23 2251

## 2023-10-03 NOTE — ED Triage Notes (Signed)
 Pt [facility]. As per EMS pt was walking with her dog when another dog (pitbull) attacked them. Pt fell hitting her knees first. Denied LOC, Denied hitting head. No bite from the dog. Pt complaining of left hip pain 6/10 when moving.

## 2023-10-06 DIAGNOSIS — S7012XA Contusion of left thigh, initial encounter: Secondary | ICD-10-CM | POA: Diagnosis not present

## 2023-10-06 DIAGNOSIS — S7002XA Contusion of left hip, initial encounter: Secondary | ICD-10-CM | POA: Diagnosis not present

## 2023-10-13 ENCOUNTER — Other Ambulatory Visit (HOSPITAL_COMMUNITY): Payer: Self-pay | Admitting: Psychiatry

## 2023-10-13 DIAGNOSIS — F419 Anxiety disorder, unspecified: Secondary | ICD-10-CM

## 2023-10-13 DIAGNOSIS — F4310 Post-traumatic stress disorder, unspecified: Secondary | ICD-10-CM

## 2023-10-31 DIAGNOSIS — M47816 Spondylosis without myelopathy or radiculopathy, lumbar region: Secondary | ICD-10-CM | POA: Diagnosis not present

## 2023-10-31 DIAGNOSIS — M169 Osteoarthritis of hip, unspecified: Secondary | ICD-10-CM | POA: Diagnosis not present

## 2023-10-31 DIAGNOSIS — Z79899 Other long term (current) drug therapy: Secondary | ICD-10-CM | POA: Diagnosis not present

## 2023-10-31 DIAGNOSIS — G894 Chronic pain syndrome: Secondary | ICD-10-CM | POA: Diagnosis not present

## 2023-10-31 DIAGNOSIS — I10 Essential (primary) hypertension: Secondary | ICD-10-CM | POA: Diagnosis not present

## 2023-11-01 ENCOUNTER — Other Ambulatory Visit (HOSPITAL_COMMUNITY): Payer: Self-pay | Admitting: Psychiatry

## 2023-11-01 DIAGNOSIS — F419 Anxiety disorder, unspecified: Secondary | ICD-10-CM

## 2023-11-01 DIAGNOSIS — F4310 Post-traumatic stress disorder, unspecified: Secondary | ICD-10-CM

## 2023-11-08 ENCOUNTER — Encounter: Payer: Self-pay | Admitting: Oncology

## 2023-11-08 ENCOUNTER — Inpatient Hospital Stay: Payer: 59 | Attending: Oncology | Admitting: Oncology

## 2023-11-08 VITALS — BP 132/107 | HR 96 | Temp 98.0°F | Resp 18 | Wt 250.0 lb

## 2023-11-08 DIAGNOSIS — Z170 Estrogen receptor positive status [ER+]: Secondary | ICD-10-CM | POA: Insufficient documentation

## 2023-11-08 DIAGNOSIS — Z79811 Long term (current) use of aromatase inhibitors: Secondary | ICD-10-CM | POA: Insufficient documentation

## 2023-11-08 DIAGNOSIS — D0512 Intraductal carcinoma in situ of left breast: Secondary | ICD-10-CM | POA: Diagnosis not present

## 2023-11-08 DIAGNOSIS — Z79899 Other long term (current) drug therapy: Secondary | ICD-10-CM | POA: Diagnosis not present

## 2023-11-08 DIAGNOSIS — M85832 Other specified disorders of bone density and structure, left forearm: Secondary | ICD-10-CM

## 2023-11-08 DIAGNOSIS — Z08 Encounter for follow-up examination after completed treatment for malignant neoplasm: Secondary | ICD-10-CM

## 2023-11-08 NOTE — Progress Notes (Unsigned)
 New dizziness for last couple of months that continues to worsen.  Last episode last Wednesday and had a fall due to dizziness.    Orthostatic bp check today:  Lying 139/110 Sitting 112/77 Standing 132/107  No dizziness.  Has been prescribed Avalide 300-12.5 but not taking.

## 2023-11-08 NOTE — Progress Notes (Unsigned)
 Hematology/Oncology Consult note [facility]  Telephone:([phone] [phone]  Patient Care Team: Georgann Housekeeper, MD as PCP - General (Internal Medicine) Pershing Proud, RN as Oncology Nurse Navigator Donnelly Angelica, RN as Oncology Nurse Navigator Merryl Hacker, NP as Nurse Practitioner (Nurse Practitioner) Creig Hines, MD as Consulting Physician (Oncology)   Name of the patient: Kelli Abbott  [id number]  1956/04/26   Date of visit: 11/08/23  Diagnosis-left breast DCIS ER positive  Chief complaint/ Reason for visit-routine follow-up of left breast DCIS  Heme/Onc history:  Patient is a 68 year old female who was seen by Dr. Pamelia Hoit in [place] in July 2022 after her mammogram and biopsy was consistent with DCIS.  She was started on Arimidex at that time and subsequently saw Dr. Lemar Livings and underwentLumpectomy.  Her original mammogram in March 2022 showed broad area of calcifications 7.3 x 7.3 cm in the lower inner quadrant of the left breast.  Final lumpectomy pathology showed intermediate to high-grade DCIS with comedonecrosis anterior margin less than 2 mm and inferior unifocal margin positive.  She did not undergo reexcision and completed adjuvant radiation treatment and radiation boost to the positive margin.  Patient was subsequently switched over from Arimidex to letrozole as she had side effects from Arimidex     Interval history-she is tolerating letrozole well without any significant side effects.  Denies any breast concerns today.  ECOG PS- 1 Pain scale- 0   Review of systems- Review of Systems  Constitutional:  Negative for chills, fever, malaise/fatigue and weight loss.  HENT:  Negative for congestion, ear discharge and nosebleeds.   Eyes:  Negative for blurred vision.  Respiratory:  Negative for cough, hemoptysis, sputum production, shortness of breath and wheezing.   Cardiovascular:  Negative for chest pain, palpitations,  orthopnea and claudication.  Gastrointestinal:  Negative for abdominal pain, blood in stool, constipation, diarrhea, heartburn, melena, nausea and vomiting.  Genitourinary:  Negative for dysuria, flank pain, frequency, hematuria and urgency.  Musculoskeletal:  Negative for back pain, joint pain and myalgias.  Skin:  Negative for rash.  Neurological:  Negative for dizziness, tingling, focal weakness, seizures, weakness and headaches.  Endo/Heme/Allergies:  Does not bruise/bleed easily.  Psychiatric/Behavioral:  Negative for depression and suicidal ideas. The patient does not have insomnia.       Allergies  Allergen Reactions   Bupropion Hcl     Contributing to memory loss     Past Medical History:  Diagnosis Date   Anemia    Arthritis    Osteoarthritis   Breast cancer (HCC)    Breast lump    Breast nodule    hypoechoic in right breast   Bursitis of shoulder region    Cancer [facility])    breast   Family history of breast cancer 11/06/2020   Family history of pancreatic cancer 11/06/2020   Hypertension    Mass on back    Personal history of radiation therapy 2009/2010   PONV (postoperative nausea and vomiting)    Skin lesions, generalized      Past Surgical History:  Procedure Laterality Date   BREAST BIOPSY Right 04/29/2008   malignant   BREAST BIOPSY Right 11/24/2015   benign   BREAST LUMPECTOMY Right 06/04/2008   BREAST LUMPECTOMY WITH NEEDLE LOCALIZATION Left 02/18/2021   Procedure: BREAST LUMPECTOMY WITH NEEDLE LOCALIZATION;  Surgeon: Earline Mayotte, MD;  Location: [facility] ORS;  Service: General;  Laterality: Left;  combined case with Dr. Ulice Bold   BREAST  SURGERY  2009   R breast reexcision of margins   HYSTEROSCOPY WITH D & C N/A 11/06/2014   Procedure: DILATATION AND CURETTAGE /HYSTEROSCOPY with resection and myomectomy;  Surgeon: Johnn Najjar, MD;  Location: [facility] ORS;  Service: Gynecology;  Laterality: N/A;   MASS EXCISION N/A 01/17/2018   Procedure:  EXCISION  OF UPPER BACK MASS AND SKIN TAGS;  Surgeon: Lockie Rima, MD;  Location: [facility] OR;  Service: General;  Laterality: N/A;   TOTAL HIP ARTHROPLASTY Bilateral 04/2020   right   UNILATERAL BREAST REDUCTION Left 02/18/2021   Procedure: LEFT ONCOPLASTIC BREAST REDUCTION;  Surgeon: Thornell Flirt, DO;  Location: [facility] ORS;  Service: Plastics;  Laterality: Left;    Social History   Socioeconomic History   Marital status: Legally Separated    Spouse name: Not on file   Number of children: Not on file   Years of education: Not on file   Highest education level: Not on file  Occupational History   Not on file  Tobacco Use   Smoking status: Former    Current packs/day: 0.00    Average packs/day: 1 pack/day for 20.0 years (20.0 ttl pk-yrs)    Types: Cigarettes    Start date: 04/26/1988    Quit date: 04/26/2008    Years since quitting: 15.5   Smokeless tobacco: Never  Vaping Use   Vaping status: Never Used  Substance and Sexual Activity   Alcohol use: Not Currently   Drug use: No   Sexual activity: Not Currently  Other Topics Concern   Not on file  Social History Narrative   Not on file   Social Drivers of Health   Financial Resource Strain: Low Risk  (05/05/2020)   Received from [facility], [facility]   Overall Financial Resource Strain (CARDIA)    Difficulty of Paying Living Expenses: Not hard at all  Food Insecurity: No Food Insecurity (03/11/2022)   Received from [facility], [facility]   Hunger Vital Sign    Worried About Running Out of Food in the Last Year: Never true    Ran Out of Food in the Last Year: Never true  Transportation Needs: Not on file  Physical Activity: Not on file  Stress: No Stress Concern Present (11/26/2020)   Received from [facility], [facility]   [employer] of Occupational Health - Occupational Stress Questionnaire    Feeling of Stress : Not at all  Social Connections: Unknown (12/08/2021)   Received from [facility], [facility]   Social Network    Social Network: Not on file  Intimate Partner Violence: Unknown (10/29/2021)   Received from [facility], [facility]   HITS    Physically Hurt: Not on file    Insult or Talk Down To: Not on file    Threaten Physical Harm: Not on file    Scream or Curse: Not on file    Family History  Problem Relation Age of Onset   Hypertension Mother    Hypertension Father    Hypertension Brother    Breast cancer Paternal Aunt        dx before 62   Pancreatic cancer Paternal Grandfather        dx 30s     Current Outpatient Medications:    acetaminophen (TYLENOL) 650 MG CR tablet, Take 650 mg by mouth every 8 (eight) hours as needed for pain., Disp: , Rfl:    amLODipine (NORVASC) 5 MG tablet, Take 5 mg by mouth daily.,  Disp: , Rfl:    calcium carbonate (OS-CAL) 1250 (500 Ca) MG chewable tablet, Chew 1 tablet by mouth daily., Disp: , Rfl:    cholecalciferol (VITAMIN D) 1000 units tablet, Take 1,000 Units by mouth daily., Disp: , Rfl:    DULoxetine (CYMBALTA) 30 MG capsule, Take 1 capsule (30 mg total) by mouth daily., Disp: 30 capsule, Rfl: 2   ferrous sulfate 325 (65 FE) MG tablet, Take 325 mg by mouth daily with breakfast., Disp: , Rfl:    HYDROcodone-acetaminophen (NORCO) 10-325 MG tablet, Take 1-2 tablets by mouth every 6 (six) hours as needed for moderate pain or severe pain., Disp: 56 tablet, Rfl: 0   hydrOXYzine (ATARAX) 10 MG tablet, Take 1-2 tablets (10-20 mg) to take bed time., Disp: 30 tablet, Rfl: 2   irbesartan-hydrochlorothiazide (AVALIDE) 300-12.5 MG tablet, Take 1 tablet by mouth daily., Disp: , Rfl:    letrozole (FEMARA) 2.5 MG tablet, Take 1 tablet (2.5 mg total) by mouth daily., Disp: 30 tablet, Rfl: 3   olopatadine (PATANOL) 0.1 % ophthalmic solution, Place 1 drop into both eyes 2 (two) times daily as needed for allergies., Disp: , Rfl:    OZEMPIC, 0.25 OR 0.5 MG/DOSE, 2 MG/1.5ML SOPN, Inject 0.5 mg into the skin every Thursday.,  Disp: , Rfl:    solifenacin (VESICARE) 10 MG tablet, Take 10 mg by mouth daily., Disp: , Rfl:   Physical exam:  Vitals:   11/08/23 1427 11/08/23 1431 11/08/23 1437  BP: (!) 132/99 118/78 (!) 132/107  Pulse: (!) 102 96   Resp: 18    Temp: 98 F (36.7 C)    Weight: 250 lb (113.4 kg)     Physical Exam Cardiovascular:     Rate and Rhythm: Normal rate and regular rhythm.     Heart sounds: Normal heart sounds.  Pulmonary:     Effort: Pulmonary effort is normal.     Breath sounds: Normal breath sounds.  Abdominal:     General: Bowel sounds are normal.     Palpations: Abdomen is soft.  Skin:    General: Skin is warm and dry.  Neurological:     Mental Status: She is alert and oriented to person, place, and time.    Breast exam was performed in seated and lying down position. Patient is status post left lumpectomy with a well-healed surgical scar. No evidence of any palpable masses. No evidence of axillary adenopathy. No evidence of any palpable masses or lumps in the right breast. No evidence of right axillary adenopathy  I have personally reviewed labs listed below:    Latest Ref Rng & Units 01/05/2018   10:12 AM  CMP  Glucose 65 - 99 mg/dL 94   BUN 6 - 20 mg/dL 8   Creatinine 5.40 - 9.81 mg/dL 1.91   Sodium 478 - 295 mmol/L 140   Potassium 3.5 - 5.1 mmol/L 4.0   Chloride 101 - 111 mmol/L 105   CO2 22 - 32 mmol/L 28   Calcium 8.9 - 10.3 mg/dL 62.1       Latest Ref Rng & Units 01/11/2022    1:46 PM  CBC  WBC 4.0 - 10.5 K/uL 10.2   Hemoglobin 12.0 - 15.0 g/dL 30.8   Hematocrit 65.7 - 46.0 % 43.6   Platelets 150 - 400 K/uL 297      Assessment and plan- Patient is a 68 y.o. female with history of left breast DCIS ER positive presently on letrozole  here for routine follow-up  Patient is tolerating letrozole along with calcium and vitamin D well without any significant side effects.  Clinically she Doing well with no concerning signs and symptoms of recurrence based on  today's exam.  She will need a mammogram this month which we will schedule.  I will see her back in 6 months no labs. Also discussed the results of the bone density scan which she had in January 2025 which shows osteopenia of her forearm with a T-score of -1.6.  Will use this as a baseline to monitor her future bone density scan   Visit Diagnosis 1. Encounter for follow-up surveillance of ductal carcinoma in situ (DCIS) of breast   2. High risk medication use   3. Use of letrozole (Femara)   4. Osteopenia of left forearm      Dr. Seretha Dance, MD, MPH [facility] [phone] 11/08/2023 2:18 PM

## 2023-11-16 ENCOUNTER — Encounter

## 2023-11-24 ENCOUNTER — Ambulatory Visit (HOSPITAL_COMMUNITY): Payer: 59 | Admitting: Psychiatry

## 2023-11-30 DIAGNOSIS — M25552 Pain in left hip: Secondary | ICD-10-CM | POA: Diagnosis not present

## 2023-11-30 DIAGNOSIS — M47816 Spondylosis without myelopathy or radiculopathy, lumbar region: Secondary | ICD-10-CM | POA: Diagnosis not present

## 2023-11-30 DIAGNOSIS — G894 Chronic pain syndrome: Secondary | ICD-10-CM | POA: Diagnosis not present

## 2023-11-30 DIAGNOSIS — Z79899 Other long term (current) drug therapy: Secondary | ICD-10-CM | POA: Diagnosis not present

## 2023-11-30 DIAGNOSIS — Z1159 Encounter for screening for other viral diseases: Secondary | ICD-10-CM | POA: Diagnosis not present

## 2023-12-13 DIAGNOSIS — G894 Chronic pain syndrome: Secondary | ICD-10-CM | POA: Diagnosis not present

## 2023-12-13 DIAGNOSIS — I10 Essential (primary) hypertension: Secondary | ICD-10-CM | POA: Diagnosis not present

## 2023-12-13 DIAGNOSIS — C50912 Malignant neoplasm of unspecified site of left female breast: Secondary | ICD-10-CM | POA: Diagnosis not present

## 2023-12-14 ENCOUNTER — Inpatient Hospital Stay: Admission: RE | Admit: 2023-12-14 | Source: Ambulatory Visit

## 2023-12-21 ENCOUNTER — Telehealth: Payer: Self-pay | Admitting: *Deleted

## 2023-12-21 NOTE — Telephone Encounter (Signed)
 Patient called and stated she was to call Dr Randy Buttery with an update on how she is feeling.  Patient states she is feeling very well and no longer has any issues with dizziness.  RN stated she would let Dr Randy Buttery know and if she has any concerns to call clinic.  Pt verbalized understanding.

## 2023-12-30 DIAGNOSIS — Z79899 Other long term (current) drug therapy: Secondary | ICD-10-CM | POA: Diagnosis not present

## 2023-12-30 DIAGNOSIS — M47816 Spondylosis without myelopathy or radiculopathy, lumbar region: Secondary | ICD-10-CM | POA: Diagnosis not present

## 2023-12-30 DIAGNOSIS — M25552 Pain in left hip: Secondary | ICD-10-CM | POA: Diagnosis not present

## 2024-01-04 ENCOUNTER — Encounter

## 2024-01-17 ENCOUNTER — Ambulatory Visit
Admission: RE | Admit: 2024-01-17 | Discharge: 2024-01-17 | Disposition: A | Discharge status: Home / Self Care | Source: Ambulatory Visit | Attending: Oncology | Admitting: Oncology

## 2024-01-17 DIAGNOSIS — Z86000 Personal history of in-situ neoplasm of breast: Secondary | ICD-10-CM | POA: Insufficient documentation

## 2024-01-17 DIAGNOSIS — Z08 Encounter for follow-up examination after completed treatment for malignant neoplasm: Secondary | ICD-10-CM | POA: Diagnosis not present

## 2024-01-17 DIAGNOSIS — R92323 Mammographic fibroglandular density, bilateral breasts: Secondary | ICD-10-CM | POA: Diagnosis not present

## 2024-01-30 DIAGNOSIS — Z79899 Other long term (current) drug therapy: Secondary | ICD-10-CM | POA: Diagnosis not present

## 2024-01-30 DIAGNOSIS — M25552 Pain in left hip: Secondary | ICD-10-CM | POA: Diagnosis not present

## 2024-01-30 DIAGNOSIS — R29818 Other symptoms and signs involving the nervous system: Secondary | ICD-10-CM | POA: Diagnosis not present

## 2024-01-30 DIAGNOSIS — M47816 Spondylosis without myelopathy or radiculopathy, lumbar region: Secondary | ICD-10-CM | POA: Diagnosis not present

## 2024-02-13 ENCOUNTER — Other Ambulatory Visit: Payer: Self-pay | Admitting: Oncology

## 2024-02-21 ENCOUNTER — Other Ambulatory Visit (HOSPITAL_COMMUNITY): Payer: Self-pay | Admitting: Psychiatry

## 2024-02-21 DIAGNOSIS — F4310 Post-traumatic stress disorder, unspecified: Secondary | ICD-10-CM

## 2024-02-21 DIAGNOSIS — F419 Anxiety disorder, unspecified: Secondary | ICD-10-CM

## 2024-02-23 DIAGNOSIS — Z9189 Other specified personal risk factors, not elsewhere classified: Secondary | ICD-10-CM | POA: Diagnosis not present

## 2024-02-28 DIAGNOSIS — Z79899 Other long term (current) drug therapy: Secondary | ICD-10-CM | POA: Diagnosis not present

## 2024-02-28 DIAGNOSIS — M47816 Spondylosis without myelopathy or radiculopathy, lumbar region: Secondary | ICD-10-CM | POA: Diagnosis not present

## 2024-02-28 DIAGNOSIS — M25552 Pain in left hip: Secondary | ICD-10-CM | POA: Diagnosis not present

## 2024-03-01 ENCOUNTER — Ambulatory Visit (HOSPITAL_BASED_OUTPATIENT_CLINIC_OR_DEPARTMENT_OTHER): Admitting: Psychiatry

## 2024-03-01 ENCOUNTER — Encounter (HOSPITAL_COMMUNITY): Payer: Self-pay | Admitting: Psychiatry

## 2024-03-01 ENCOUNTER — Other Ambulatory Visit: Payer: Self-pay

## 2024-03-01 VITALS — BP 158/95 | HR 89 | Ht 65.0 in | Wt 272.0 lb

## 2024-03-01 DIAGNOSIS — F4310 Post-traumatic stress disorder, unspecified: Secondary | ICD-10-CM

## 2024-03-01 DIAGNOSIS — F419 Anxiety disorder, unspecified: Secondary | ICD-10-CM

## 2024-03-01 MED ORDER — HYDROXYZINE HCL 10 MG PO TABS: ORAL_TABLET | ORAL | 2 refills | Status: AC

## 2024-03-01 MED ORDER — DULOXETINE HCL 30 MG PO CPEP: 30.0000 mg | ORAL_CAPSULE | Freq: Every day | ORAL | 2 refills | Status: DC

## 2024-03-01 NOTE — Progress Notes (Signed)
 Kelli Abbott to places without any issues.  She admitted taking the medication which is helping her anxiety and nightmares.  She is taking hydroxyzine  10 mg few times a week which really help her sleep and nervousness.  She is happy as grandson started to communicating with her more frequently.  Patient told her 5 year old grandson working at [employer] in [place]  but going to [place]  very soon to start college.  Patient told some concerns about her daughter whose job is somewhat challenging and daughter not sure if the job will remain.  Patient is happy as letter to support to keep the animal helped.  She enjoys the company of the dog which is also helping her emotionally.  Patient decided to quit the work because it was very difficult for her and she was only keeping the job to help her health insurance.  Now she has doctors appointment and medication covered from [employer].  She admitted few pounds weight gain because she is not as active but like to work on her exercise.  She admitted pain in her knee joint is worsening.  She denies any crying spells or any feeling of hopelessness or worthlessness.  Patient told since she quit the work she has a lot of time and she like to spend time with the church activities.  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  Patient denies any hopelessness or worthlessness.  She denies any panic attack.  She really like the Cymbalta  which is keeping her anxiety under control.  Visit Diagnosis:    ICD-10-CM   1. PTSD (post-traumatic stress disorder)  F43.10  DULoxetine  (CYMBALTA ) 30 MG capsule    hydrOXYzine  (ATARAX ) 10 MG tablet    2. Anxiety  F41.9 DULoxetine  (CYMBALTA ) 30 MG capsule    hydrOXYzine  (ATARAX ) 10 MG tablet         Past Psychiatric History:  No history of suicidal attempt, paranoia, hallucination. Given Wellbutrin  but do not remember the details.   Past Medical History:  Past Medical History:  Diagnosis Date   Anemia    Arthritis    Osteoarthritis   Breast cancer (HCC)    Breast lump    Breast nodule    hypoechoic in right breast   Bursitis of shoulder region    Cancer [facility])    breast   Family history of breast cancer 11/06/2020   Family history of pancreatic cancer 11/06/2020   Hypertension    Mass on back    Personal history of radiation therapy 2009/2010   PONV (postoperative nausea and vomiting)    Skin lesions, generalized     Past Surgical History:  Procedure Laterality Date   BREAST BIOPSY Right 04/29/2008   malignant   BREAST BIOPSY Right 11/24/2015   benign   BREAST LUMPECTOMY Right 06/04/2008   BREAST LUMPECTOMY WITH NEEDLE LOCALIZATION Left 02/18/2021   Procedure: BREAST LUMPECTOMY WITH NEEDLE LOCALIZATION;  Surgeon: Dessa Reyes ORN, MD;  Location: [facility] ORS;  Service: General;  Laterality: Left;  combined case with Dr. Lowery   BREAST SURGERY  2009   R breast reexcision of margins   HYSTEROSCOPY WITH D & C N/A 11/06/2014   Procedure: DILATATION AND CURETTAGE /HYSTEROSCOPY with resection and myomectomy;  Surgeon: Hargis Paradise, MD;  Location: [facility];  Service: Gynecology;  Laterality: N/A;   MASS EXCISION N/A 01/17/2018   Procedure: EXCISION  OF UPPER BACK MASS AND SKIN TAGS;  Surgeon: Aron Shoulders, MD;  Location: [facility] OR;  Service: General;  Laterality: N/A;   TOTAL HIP ARTHROPLASTY Bilateral 04/2020   right   UNILATERAL BREAST REDUCTION Left 02/18/2021   Procedure: LEFT ONCOPLASTIC BREAST REDUCTION;  Surgeon: Lowery Estefana RAMAN, DO;  Location: [facility] ORS;  Service: Plastics;   Laterality: Left;    Family Psychiatric History: Reviewed.  Family History:  Family History  Problem Relation Age of Onset   Hypertension Mother    Hypertension Father    Hypertension Brother    Breast cancer Paternal Aunt        dx before 4   Pancreatic cancer Paternal Grandfather        dx 44s    Social History:  Social History   Socioeconomic History   Marital status: Legally Separated    Spouse name: Not on file   Number of children: Not on file   Years of education: Not on file   Highest education level: Not on file  Occupational History   Not on file  Tobacco Use   Smoking status: Former    Current packs/day: 0.00    Average packs/day: 1 pack/day for 20.0 years (20.0 ttl pk-yrs)    Types: Cigarettes    Start date: 04/26/1988    Quit date: 04/26/2008    Years since quitting: 15.8   Smokeless tobacco: Never  Vaping Use   Vaping status: Never Used  Substance and Sexual Activity   Alcohol use: Not Currently   Drug use: No   Sexual activity: Not Currently  Other Topics Concern   Not on file  Social History Narrative   Not on file   Social Drivers of Health   Financial Resource Strain: Low Risk  (05/05/2020)   Received from [facility]   Overall Financial Resource Strain (CARDIA)    Difficulty of Paying Living Expenses: Not hard at all  Food Insecurity: No Food Insecurity (03/11/2022)   Received from [facility]   Hunger Vital Sign    Within the past 12 months, you worried that your food would run out before you got the money to buy more.: Never true    Within the past 12 months, the food you bought just didn't last and you didn't have money to get more.: Never true  Transportation Needs: Not on file  Physical Activity: Not on file  Stress: No Stress Concern Present (11/26/2020)   Received from [facility] of Occupational Health - Occupational Stress Questionnaire    Feeling of Stress : Not at all  Social Connections: Unknown  (12/08/2021)   Received from [facility]   Social Network    Social Network: Not on file    Allergies:  Allergies  Allergen Reactions   Bupropion  Hcl     Contributing to memory loss    Metabolic Disorder Labs: No results found for: HGBA1C, MPG No results found for: PROLACTIN No results found for: CHOL, TRIG, HDL, CHOLHDL, VLDL, LDLCALC No results found for: TSH  Therapeutic Level Labs: No results found for: LITHIUM No results found for: VALPROATE No results found for: CBMZ  Current Medications: Current  Outpatient Medications  Medication Sig Dispense Refill   acetaminophen  (TYLENOL ) 650 MG CR tablet Take 650 mg by mouth every 8 (eight) hours as needed for pain.     amLODipine (NORVASC) 5 MG tablet Take 5 mg by mouth daily.     calcium carbonate (OS-CAL) 1250 (500 Ca) MG chewable tablet Chew 1 tablet by mouth daily.     cholecalciferol (VITAMIN D) 1000 units tablet Take 1,000 Units by mouth daily.     DULoxetine  (CYMBALTA ) 30 MG capsule Take 1 capsule (30 mg total) by mouth daily. 30 capsule 2   ferrous sulfate 325 (65 FE) MG tablet Take 325 mg by mouth daily with breakfast.     HYDROcodone -acetaminophen  (NORCO) 10-325 MG tablet Take 1-2 tablets by mouth every 6 (six) hours as needed for moderate pain or severe pain. 56 tablet 0   hydrOXYzine  (ATARAX ) 10 MG tablet Take 1-2 tablets (10-20 mg) to take bed time. 30 tablet 2   irbesartan -hydrochlorothiazide  (AVALIDE) 300-12.5 MG tablet Take 1 tablet by mouth daily.     letrozole  (FEMARA ) 2.5 MG tablet TAKE ONE TABLET BY MOUTH EVERY DAY 30 tablet 3   olopatadine (PATANOL) 0.1 % ophthalmic solution Place 1 drop into both eyes 2 (two) times daily as needed for allergies.     phentermine  (ADIPEX-P ) 37.5 MG tablet Take 37.5 mg by mouth daily before breakfast.     No current facility-administered medications for this visit.     Musculoskeletal: Strength & Muscle Tone: within normal limits Gait & Station:  normal Patient leans: N/A  Psychiatric Specialty Exam: Physical Exam  Review of Systems  Musculoskeletal:        Knee pain  Neurological:        Uses caine to help her balance    Blood pressure (!) 158/95, pulse 89, height 5' 5 (1.651 m), weight 272 lb (123.4 kg).Body mass index is 45.26 kg/m.  General Appearance: Casual  Eye Contact:  Fair  Speech:  Normal Rate  Volume:  Normal  Mood:  Euthymic  Affect:  Congruent  Thought Process:  Descriptions of Associations: Intact  Orientation:  Full (Time, Place, and Person)  Thought Content:  WDL  Suicidal Thoughts:  No  Homicidal Thoughts:  No  Memory:  Immediate;   Good Recent;   Fair Remote;   Fair  Judgement:  Intact  Insight:  Present  Psychomotor Activity:  Normal  Concentration:  Concentration: Fair and Attention Span: Fair  Recall:  Good  Fund of Knowledge:  Good  Language:  Good  Akathisia:  No  Handed:  Right  AIMS (if indicated):     Assets:  Communication Skills Desire for Improvement Housing Transportation  ADL's:  Intact  Cognition:  WNL  Sleep:   ok     Screenings: GAD-7    Flowsheet Row [facility]  Total GAD-7 Score 4   PHQ2-9    Flowsheet Row Office Visit from 10/14/2022 in [facility]  PHQ-2 Total Score 2  PHQ-9 Total Score 5   Flowsheet Row ED from 10/03/2023 in [facility] Emergency Department at [facility] Pre-Admission Testing 45 from 10/22/2021 in [facility] PRE [facility]MISSION TESTING Admission (Discharged) from 02/18/2021 in [facility] PERIOPER[facility]IVE AREA  C-SSRS RISK CATEGORY No Risk No Risk No Risk     Assessment and Plan:  Patient is 68 year old African-American female with history of osteoarthritis, hypertension, history of breast cancer, anxiety and  PTSD.  She is currently stable on medication.  She has no tremor or shakes or  any EPS.  Encouraged to continue hydroxyzine  10 mg as needed for anxiety and continue Cymbalta  30 mg daily which is helping her PTSD symptoms.  Recommended to call back if she is any question or any concern.  Follow-up in 3 months.  Collaboration of Care: Collaboration of Care: Other provider involved in patient's care AEB notes are available in epic to review.  Patient/Guardian was advised Release of Information must be obtained prior to any record release in order to collaborate their care with an outside provider. Patient/Guardian was advised if they have not already done so to contact the registration department to sign all necessary forms in order for us  to release information regarding their care.   Consent: Patient/Guardian gives verbal consent for treatment and assignment of benefits for services provided during this visit. Patient/Guardian expressed understanding and agreed to proceed.     Leni ONEIDA Client, MD 03/01/2024, 8:42 AM

## 2024-03-06 DIAGNOSIS — Z79899 Other long term (current) drug therapy: Secondary | ICD-10-CM | POA: Diagnosis not present

## 2024-03-30 DIAGNOSIS — Z79899 Other long term (current) drug therapy: Secondary | ICD-10-CM | POA: Diagnosis not present

## 2024-03-30 DIAGNOSIS — M47816 Spondylosis without myelopathy or radiculopathy, lumbar region: Secondary | ICD-10-CM | POA: Diagnosis not present

## 2024-03-30 DIAGNOSIS — M25552 Pain in left hip: Secondary | ICD-10-CM | POA: Diagnosis not present

## 2024-04-23 DIAGNOSIS — H43821 Vitreomacular adhesion, right eye: Secondary | ICD-10-CM | POA: Diagnosis not present

## 2024-04-23 DIAGNOSIS — H35033 Hypertensive retinopathy, bilateral: Secondary | ICD-10-CM | POA: Diagnosis not present

## 2024-04-23 DIAGNOSIS — H2513 Age-related nuclear cataract, bilateral: Secondary | ICD-10-CM | POA: Diagnosis not present

## 2024-04-23 DIAGNOSIS — H43812 Vitreous degeneration, left eye: Secondary | ICD-10-CM | POA: Diagnosis not present

## 2024-04-23 DIAGNOSIS — H31092 Other chorioretinal scars, left eye: Secondary | ICD-10-CM | POA: Diagnosis not present

## 2024-04-30 DIAGNOSIS — M25552 Pain in left hip: Secondary | ICD-10-CM | POA: Diagnosis not present

## 2024-04-30 DIAGNOSIS — M47816 Spondylosis without myelopathy or radiculopathy, lumbar region: Secondary | ICD-10-CM | POA: Diagnosis not present

## 2024-04-30 DIAGNOSIS — Z79899 Other long term (current) drug therapy: Secondary | ICD-10-CM | POA: Diagnosis not present

## 2024-04-30 DIAGNOSIS — R29818 Other symptoms and signs involving the nervous system: Secondary | ICD-10-CM | POA: Diagnosis not present

## 2024-05-04 ENCOUNTER — Telehealth: Payer: Self-pay | Admitting: Oncology

## 2024-05-04 NOTE — Telephone Encounter (Signed)
 Pt called and stated she only wants to see MD not an NP. Pt was scheduled to see NP 10/14. I have r/s appt to MD schedule on 10/17 and confirmed new date/time with pt.

## 2024-05-08 ENCOUNTER — Inpatient Hospital Stay: Admitting: Nurse Practitioner

## 2024-05-11 ENCOUNTER — Encounter: Payer: Self-pay | Admitting: Oncology

## 2024-05-11 ENCOUNTER — Inpatient Hospital Stay: Attending: Oncology | Admitting: Oncology

## 2024-05-11 VITALS — BP 121/89 | HR 88 | Temp 97.6°F | Resp 18 | Ht 65.0 in | Wt 266.0 lb

## 2024-05-11 DIAGNOSIS — D0512 Intraductal carcinoma in situ of left breast: Secondary | ICD-10-CM | POA: Diagnosis not present

## 2024-05-11 DIAGNOSIS — D170 Benign lipomatous neoplasm of skin and subcutaneous tissue of head, face and neck: Secondary | ICD-10-CM | POA: Insufficient documentation

## 2024-05-11 DIAGNOSIS — C50411 Malignant neoplasm of upper-outer quadrant of right female breast: Secondary | ICD-10-CM

## 2024-05-11 DIAGNOSIS — Z170 Estrogen receptor positive status [ER+]: Secondary | ICD-10-CM

## 2024-05-11 DIAGNOSIS — D171 Benign lipomatous neoplasm of skin and subcutaneous tissue of trunk: Secondary | ICD-10-CM | POA: Diagnosis not present

## 2024-05-11 DIAGNOSIS — Z79810 Long term (current) use of selective estrogen receptor modulators (SERMs): Secondary | ICD-10-CM | POA: Insufficient documentation

## 2024-05-11 MED ORDER — TAMOXIFEN CITRATE 20 MG PO TABS: 20.0000 mg | ORAL_TABLET | Freq: Every day | ORAL | 5 refills | Status: AC

## 2024-05-11 NOTE — Progress Notes (Signed)
 Hematology/Oncology Consult note [facility]  [phone] [phone]  Patient Care Team: Ransom Other, MD as PCP - General (Internal Medicine) Tyree Nanetta SAILOR, RN as Oncology Nurse Navigator Grossman, Janelle, NP as Nurse Practitioner (Nurse Practitioner) Melanee Annah BROCKS, MD as Consulting Physician (Oncology)   Name of the patient: Kelli Abbott  [id number]  [birth year]   Date of visit: 05/11/24  Diagnosis-left breast DCIS ER positive  Chief complaint/ Reason for visit-routine follow-up of DCIS  Heme/Onc history: Patient is a 68 year old female who was seen by Dr. Odean in [place] in July 2022 after her mammogram and biopsy was consistent with DCIS.  She was started on Arimidex  at that time and subsequently saw Dr. Dessa and underwentLumpectomy.  Her original mammogram in March 2022 showed broad area of calcifications 7.3 x 7.3 cm in the lower inner quadrant of the left breast.  Final lumpectomy pathology showed intermediate to high-grade DCIS with comedonecrosis anterior margin less than 2 mm and inferior unifocal margin positive.  She did not undergo reexcision and completed adjuvant radiation treatment and radiation boost to the positive margin.  Patient was subsequently switched over from Arimidex  to letrozole  as she had side effects from Arimidex .  Patient stopped taking letrozole  sometime in May 2025 when she continued to have episodes of dizziness  Patient also has a prior history of breast cancer back in 2009 and she had taken 5 years of tamoxifen  back then  Interval history-Kelli Abbott is a 68 year old female with a history of breast cancer who presents for follow-up regarding her breast cancer treatment and a lipoma on her back.  She has a large mass on her back, believed to be a lipoma, which was previously operated on approximately three to four years ago in [place]. Her daughter noted that it appeared unchanged  post-surgery. She is concerned about the size and location of the mass, which she feels has grown, as noted by her daughter. She is also worried about the cosmetic appearance of the mass.  She has a history of ductal carcinoma in situ (DCIS) diagnosed in 2022, for which she underwent surgery and radiation. She was previously on letrozole , which was switched from Arimidex  due to dizziness. She was taken off letrozole  a couple of months ago due to these side effects and is currently not on any medication. She had taken tamoxifen  for five years following a breast cancer diagnosis in 2009, which she tolerated well without side effects. She inquires about the possibility of returning to tamoxifen , as she had no side effects during her previous course. No other breast cancer diagnoses prior to 2022 apart from the 2009 diagnosis in the right breast.  ECOG PS- 1 Pain scale- 0   Review of systems- Review of Systems  Constitutional:  Negative for chills, fever, malaise/fatigue and weight loss.  HENT:  Negative for congestion, ear discharge and nosebleeds.   Eyes:  Negative for blurred vision.  Respiratory:  Negative for cough, hemoptysis, sputum production, shortness of breath and wheezing.   Cardiovascular:  Negative for chest pain, palpitations, orthopnea and claudication.  Gastrointestinal:  Negative for abdominal pain, blood in stool, constipation, diarrhea, heartburn, melena, nausea and vomiting.  Genitourinary:  Negative for dysuria, flank pain, frequency, hematuria and urgency.  Musculoskeletal:  Negative for back pain, joint pain and myalgias.       Neck mass  Skin:  Negative for rash.  Neurological:  Negative for dizziness, tingling, focal weakness, seizures, weakness and  headaches.  Endo/Heme/Allergies:  Does not bruise/bleed easily.  Psychiatric/Behavioral:  Negative for depression and suicidal ideas. The patient does not have insomnia.       Allergies  Allergen Reactions   Bupropion  Hcl      Contributing to memory loss     Past Medical History:  Diagnosis Date   Anemia    Arthritis    Osteoarthritis   Breast cancer (HCC)    Breast lump    Breast nodule    hypoechoic in right breast   Bursitis of shoulder region    Cancer [facility])    breast   Family history of breast cancer 11/06/2020   Family history of pancreatic cancer 11/06/2020   Hypertension    Mass on back    Personal history of radiation therapy 2009/2010   PONV (postoperative nausea and vomiting)    Skin lesions, generalized      Past Surgical History:  Procedure Laterality Date   BREAST BIOPSY Right 04/29/2008   malignant   BREAST BIOPSY Right 11/24/2015   benign   BREAST LUMPECTOMY Right 06/04/2008   BREAST LUMPECTOMY WITH NEEDLE LOCALIZATION Left 02/18/2021   Procedure: BREAST LUMPECTOMY WITH NEEDLE LOCALIZATION;  Surgeon: Dessa Reyes ORN, MD;  Location: [facility] ORS;  Service: General;  Laterality: Left;  combined case with Dr. Lowery   BREAST SURGERY  2009   R breast reexcision of margins   HYSTEROSCOPY WITH D & C N/A 11/06/2014   Procedure: DILATATION AND CURETTAGE /HYSTEROSCOPY with resection and myomectomy;  Surgeon: Hargis Paradise, MD;  Location: [facility];  Service: Gynecology;  Laterality: N/A;   MASS EXCISION N/A 01/17/2018   Procedure: EXCISION  OF UPPER BACK MASS AND SKIN TAGS;  Surgeon: Aron Shoulders, MD;  Location: [facility] OR;  Service: General;  Laterality: N/A;   TOTAL HIP ARTHROPLASTY Bilateral 04/2020   right   UNILATERAL BREAST REDUCTION Left 02/18/2021   Procedure: LEFT ONCOPLASTIC BREAST REDUCTION;  Surgeon: Lowery Estefana RAMAN, DO;  Location: [facility] ORS;  Service: Plastics;  Laterality: Left;    Social History   Socioeconomic History   Marital status: Legally Separated    Spouse name: Not on file   Number of children: Not on file   Years of education: Not on file   Highest education level: Not on file  Occupational History   Not on file  Tobacco Use   Smoking status:  Former    Current packs/day: 0.00    Average packs/day: 1 pack/day for 20.0 years (20.0 ttl pk-yrs)    Types: Cigarettes    Start date: 04/26/1988    Quit date: 04/26/2008    Years since quitting: 16.0   Smokeless tobacco: Never  Vaping Use   Vaping status: Never Used  Substance and Sexual Activity   Alcohol use: Not Currently   Drug use: No   Sexual activity: Not Currently  Other Topics Concern   Not on file  Social History Narrative   Not on file   Social Drivers of Health   Financial Resource Strain: Low Risk  (05/05/2020)   Received from [facility]   Overall Financial Resource Strain (CARDIA)    Difficulty of Paying Living Expenses: Not hard at all  Food Insecurity: No Food Insecurity (03/11/2022)   Received from [facility]   Hunger Vital Sign    Within the past 12 months, you worried that your food would run out before you got the money to buy more.: Never true    Within the past 12 months,  the food you bought just didn't last and you didn't have money to get more.: Never true  Transportation Needs: Not on file  Physical Activity: Not on file  Stress: No Stress Concern Present (11/26/2020)   Received from [facility] of Occupational Health - Occupational Stress Questionnaire    Feeling of Stress : Not at all  Social Connections: Unknown (12/08/2021)   Received [facility]   Social Network    Social Network: Not on file  Intimate Partner Violence: Unknown (10/29/2021)   Received from [facility]   HITS    Physically Hurt: Not on file    Insult or Talk Down To: Not on file    Threaten Physical Harm: Not on file    Scream or Curse: Not on file    Family History  Problem Relation Age of Onset   Hypertension Mother    Hypertension Father    Hypertension Brother    Breast cancer Paternal Aunt        dx before 6   Pancreatic cancer Paternal Grandfather        dx 3s     Current Outpatient Medications:    acetaminophen  (TYLENOL )  650 MG CR tablet, Take 650 mg by mouth every 8 (eight) hours as needed for pain., Disp: , Rfl:    amLODipine (NORVASC) 5 MG tablet, Take 5 mg by mouth daily., Disp: , Rfl:    calcium carbonate (OS-CAL) 1250 (500 Ca) MG chewable tablet, Chew 1 tablet by mouth daily., Disp: , Rfl:    cholecalciferol (VITAMIN D) 1000 units tablet, Take 1,000 Units by mouth daily., Disp: , Rfl:    DULoxetine  (CYMBALTA ) 30 MG capsule, Take 1 capsule (30 mg total) by mouth daily., Disp: 30 capsule, Rfl: 2   ferrous sulfate 325 (65 FE) MG tablet, Take 325 mg by mouth daily with breakfast., Disp: , Rfl:    HYDROcodone -acetaminophen  (NORCO) 10-325 MG tablet, Take 1-2 tablets by mouth every 6 (six) hours as needed for moderate pain or severe pain., Disp: 56 tablet, Rfl: 0   hydrOXYzine  (ATARAX ) 10 MG tablet, Take 1-2 tablets (10-20 mg) to take bed time., Disp: 30 tablet, Rfl: 2   irbesartan -hydrochlorothiazide  (AVALIDE) 300-12.5 MG tablet, Take 1 tablet by mouth daily., Disp: , Rfl:    olopatadine (PATANOL) 0.1 % ophthalmic solution, Place 1 drop into both eyes 2 (two) times daily as needed for allergies., Disp: , Rfl:    phentermine  (ADIPEX-P ) 37.5 MG tablet, Take 37.5 mg by mouth daily before breakfast., Disp: , Rfl:    tamoxifen  (NOLVADEX ) 20 MG tablet, Take 1 tablet (20 mg total) by mouth daily., Disp: 30 tablet, Rfl: 5   topiramate  (TOPAMAX ) 50 MG tablet, Take 50 mg by mouth daily., Disp: , Rfl:   Physical exam:  Vitals:   05/11/24 1035  BP: 121/89  Pulse: 88  Resp: 18  Temp: 97.6 F (36.4 C)  TempSrc: Tympanic  SpO2: 100%  Weight: 266 lb (120.7 kg)  Height: 5' 5 (1.651 m)   Physical Exam Cardiovascular:     Rate and Rhythm: Normal rate and regular rhythm.     Heart sounds: Normal heart sounds.  Pulmonary:     Effort: Pulmonary effort is normal.     Breath sounds: Normal breath sounds.  Abdominal:     General: Bowel sounds are normal.     Palpations: Abdomen is soft.  Musculoskeletal:     Comments:  Large mass about 8 cm oval in size  noted over the midline upper back consistent with lipoma  Skin:    General: Skin is warm and dry.  Neurological:     Mental Status: She is alert and oriented to person, place, and time.   Breast exam: Patient is status post bilateral lumpectomy with a well-healed surgical scar.  No palpable bilateral adenopathy.  No palpable breast masses  I have personally reviewed labs listed below:    Latest Ref Rng & Units 01/05/2018   10:12 AM  CMP  Glucose 65 - 99 mg/dL 94   BUN 6 - 20 mg/dL 8   Creatinine 9.55 - 8.99 mg/dL 9.15   Sodium 864 - 854 mmol/L 140   Potassium 3.5 - 5.1 mmol/L 4.0   Chloride 101 - 111 mmol/L 105   CO2 22 - 32 mmol/L 28   Calcium 8.9 - 10.3 mg/dL 89.7       Latest Ref Rng & Units 01/11/2022    1:46 PM  CBC  WBC 4.0 - 10.5 K/uL 10.2   Hemoglobin 12.0 - 15.0 g/dL 85.8   Hematocrit 63.9 - 46.0 % 43.6   Platelets 150 - 400 K/uL 297       Assessment and plan- Patient is a 68 y.o. female here for routine follow-up of left breast DCIS  Ductal carcinoma in situ (DCIS) of left breast DCIS of the left breast treated with surgery and radiation. Letrozole  discontinued due to dizziness.  Patient has tolerated tamoxifen  well in the past and has not had disease recurrence on tamoxifen .  I am therefore switching her to tamoxifen  which she will continue until March 2027.   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  Patient has cosmetic concerns about the mass and therefore I am referring her to surgery for evaluation and potential removal.   Visit Diagnosis 1. Ductal carcinoma in situ (DCIS) of left breast   2. Malignant neoplasm of upper-outer quadrant of right breast in female, estrogen receptor positive (HCC)   3. Lipoma of head and neck region      Dr. Annah Skene, MD, MPH [facility] at [facility] [phone] 05/11/2024 12:37 PM

## 2024-05-11 NOTE — Progress Notes (Signed)
 Patient doing well, with no new or acute concerns.

## 2024-05-19 ENCOUNTER — Other Ambulatory Visit (HOSPITAL_COMMUNITY): Payer: Self-pay | Admitting: Psychiatry

## 2024-05-19 DIAGNOSIS — F419 Anxiety disorder, unspecified: Secondary | ICD-10-CM

## 2024-05-19 DIAGNOSIS — F4310 Post-traumatic stress disorder, unspecified: Secondary | ICD-10-CM

## 2024-05-22 ENCOUNTER — Other Ambulatory Visit (HOSPITAL_COMMUNITY): Payer: Self-pay | Admitting: Psychiatry

## 2024-05-22 DIAGNOSIS — F4310 Post-traumatic stress disorder, unspecified: Secondary | ICD-10-CM

## 2024-05-22 DIAGNOSIS — F419 Anxiety disorder, unspecified: Secondary | ICD-10-CM

## 2024-05-23 ENCOUNTER — Ambulatory Visit (INDEPENDENT_AMBULATORY_CARE_PROVIDER_SITE_OTHER): Admitting: Surgery

## 2024-05-23 ENCOUNTER — Encounter: Payer: Self-pay | Admitting: Surgery

## 2024-05-23 VITALS — BP 116/82 | HR 79 | Ht 65.0 in | Wt 265.0 lb

## 2024-05-23 DIAGNOSIS — Z170 Estrogen receptor positive status [ER+]: Secondary | ICD-10-CM

## 2024-05-23 DIAGNOSIS — N651 Disproportion of reconstructed breast: Secondary | ICD-10-CM

## 2024-05-23 DIAGNOSIS — N3941 Urge incontinence: Secondary | ICD-10-CM

## 2024-05-23 DIAGNOSIS — R222 Localized swelling, mass and lump, trunk: Secondary | ICD-10-CM | POA: Diagnosis not present

## 2024-05-23 NOTE — Progress Notes (Unsigned)
 Surgical Consultation  05/23/2024  Kelli Abbott is an 68 y.o. female.   Chief Complaint  Patient presents with   New Patient (Initial Visit)    Lipoma     HPI: Kelli Abbott is a very pleasanr female in consultation for a large upper thoracic soft tissue mass.  She reports she has had it for years.  Was removed in 2019 and shortly after had recurrence.  She does have complex surgical history and history of bilateral breast  cancer . She reports that she has pain associated to the mass that is dull mild intermittent without aggravating or elevating factors.  She also reports that the mass has grown in size. 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  She did not undergo reexcision and completed adjuvant radiation treatment and radiation boost to the positive margin.    She did have recent mammogram that I personally reviewed showing no evidence of suspicious lesions on either breast  Past Medical History:  Diagnosis Date   Anemia    Arthritis    Osteoarthritis   Breast cancer (HCC)    Breast lump    Breast nodule    hypoechoic in right breast   Bursitis of shoulder region    Cancer [facility])    breast   Family history of breast cancer 11/06/2020   Family history of pancreatic cancer 11/06/2020   Hypertension    Mass on back    Personal history of radiation therapy 2009/2010   PONV (postoperative nausea and vomiting)    Skin lesions, generalized     Past Surgical History:  Procedure Laterality Date   BREAST BIOPSY Right 04/29/2008   malignant   BREAST BIOPSY Right 11/24/2015   benign   BREAST LUMPECTOMY Right 06/04/2008   BREAST LUMPECTOMY WITH NEEDLE LOCALIZATION  Left 02/18/2021   Procedure: BREAST LUMPECTOMY WITH NEEDLE LOCALIZATION;  Surgeon: Dessa Reyes ORN, MD;  Location: [facility] ORS;  Service: General;  Laterality: Left;  combined case with Dr. Lowery   BREAST SURGERY  2009   R breast reexcision of margins   HYSTEROSCOPY WITH D & C N/A 11/06/2014   Procedure: DILATATION AND CURETTAGE /HYSTEROSCOPY with resection and myomectomy;  Surgeon: Hargis Paradise, MD;  Location: [facility];  Service: Gynecology;  Laterality: N/A;   MASS EXCISION N/A 01/17/2018   Procedure: EXCISION  OF UPPER BACK MASS AND SKIN TAGS;  Surgeon: Aron Shoulders, MD;  Location: [facility] OR;  Service: General;  Laterality: N/A;   TOTAL HIP ARTHROPLASTY Bilateral 04/2020   right   UNILATERAL BREAST REDUCTION Left 02/18/2021   Procedure: LEFT ONCOPLASTIC BREAST REDUCTION;  Surgeon: Lowery Estefana RAMAN, DO;  Location: [facility] ORS;  Service: Plastics;  Laterality: Left;    Family History  Problem Relation Age of Onset   Hypertension Mother    Hypertension Father    Hypertension Brother    Breast cancer Paternal Aunt        dx before 51   Pancreatic cancer Paternal Grandfather        dx 65s    Social History:  reports that she quit smoking about 16 years ago. Her smoking use included cigarettes. She started smoking about 36 years ago. She has a 20 pack-year  smoking history. She has been exposed to tobacco smoke. She has never used smokeless tobacco. She reports that she does not currently use alcohol. She reports that she does not use drugs.  Allergies:  Allergies  Allergen Reactions   Bupropion  Hcl     Contributing to memory loss    Medications reviewed.     ROS Full ROS performed and is otherwise negative other than what is stated in the HPI    BP 116/82   Pulse 79   Ht 5' 5 (1.651 m)   Wt 265 lb (120.2 kg)   SpO2 98%   BMI 44.10 kg/m   Physical Exam Vitals and nursing note reviewed. Exam conducted with a chaperone present.  Constitutional:      Appearance:  Normal appearance. She is obese. She is not ill-appearing.  Eyes:     General: No scleral icterus.       Right eye: No discharge.        Left eye: No discharge.  Cardiovascular:     Rate and Rhythm: Normal rate and regular rhythm.     Pulses: Normal pulses.     Heart sounds: Normal heart sounds. No murmur heard. Pulmonary:     Effort: Pulmonary effort is normal.     Breath sounds: Normal breath sounds.     Comments: BREAST: Chaperone present.  There is asymmetry but no definitive masses or lesions  Abdominal:     General: Abdomen is flat. There is no distension.     Palpations: Abdomen is soft. There is no mass.     Tenderness: There is no abdominal tenderness.     Hernia: No hernia is present.  Musculoskeletal:        General: No tenderness. Normal range of motion.     Cervical back: Normal range of motion and neck supple. No rigidity or tenderness.  Lymphadenopathy:     Cervical: No cervical adenopathy.  Skin:    General: Skin is warm and dry.     Comments: There is a 14 x 12 cm soft tissue mass located in within the subcutaneous tissue upper thoracic area it is soft and mobile.  This is consistent with lipoma  Neurological:     General: No focal deficit present.     Mental Status: She is alert and oriented to person, place, and time.  Psychiatric:        Mood and Affect: Mood normal.        Behavior: Behavior normal.        Thought Content: Thought content normal.        Judgment: Judgment normal.    Assessment/Plan: Sub q tissue upper thoracic back Discussed with the patient in detail given its size I do think that we need to obtain imaging.  Will get CT scan to make sure this is not a more complex neoplasm.  Potentially we might be able to excise it in the OR.  She was adamant about getting this excised here in the office today.  I did explain to her in detail that we will need to do appropriate workup before any surgical intervention is entertained Urology stress  incontinence we will place referral Breast asymmetry after bilateral partial mastectomy for cancer refer to plastics We will be happy to see her back after appropriate workup is completed  I personally spent a total of 60 minutes in the care of the patient today including performing a medically appropriate exam/evaluation, counseling and educating, placing orders, referring and  communicating with other health care professionals, documenting clinical information in the EHR, independently interpreting and reviewing images studies and coordinating care.      Laneta Luna, MD [facility] General Surgeon

## 2024-05-23 NOTE — Patient Instructions (Addendum)
 We will place a referral for Urology for them to see you about Urgency. They will call you about this appointment.   We are going to schedule you for a ct scan of the area to see what is going on with the area. We will call you about this appointment.    We will send a referral to plastic surgery for them to see you. They will call you about this referral.    Follow-up with our office in 3 weeks.   Please call and ask to speak with a nurse if you develop questions or concerns.   You are scheduled for a CT scan of the chest/back with contrast at [facility] on Friday November 7th. You will arrive there by 10:00 am and enter in through Entrance A.

## 2024-05-30 ENCOUNTER — Telehealth: Payer: Self-pay | Admitting: Plastic Surgery

## 2024-05-30 NOTE — Telephone Encounter (Signed)
 provider out of office at [phone] called lvmail to see if she can come sooner or another day

## 2024-06-01 ENCOUNTER — Ambulatory Visit (HOSPITAL_COMMUNITY)

## 2024-06-01 ENCOUNTER — Institutional Professional Consult (permissible substitution): Admitting: Plastic Surgery

## 2024-06-07 ENCOUNTER — Ambulatory Visit (HOSPITAL_COMMUNITY): Admitting: Psychiatry

## 2024-06-11 ENCOUNTER — Ambulatory Visit (HOSPITAL_COMMUNITY)
Admission: RE | Admit: 2024-06-11 | Discharge: 2024-06-11 | Disposition: A | Discharge status: Home / Self Care | Source: Ambulatory Visit | Attending: Surgery | Admitting: Surgery

## 2024-06-11 ENCOUNTER — Encounter (HOSPITAL_COMMUNITY): Payer: Self-pay

## 2024-06-11 DIAGNOSIS — R222 Localized swelling, mass and lump, trunk: Secondary | ICD-10-CM | POA: Diagnosis present

## 2024-06-11 MED ORDER — IOHEXOL 350 MG/ML SOLN
50.0000 mL | Freq: Once | INTRAVENOUS | Status: AC | PRN
  Administered 2024-06-11: 50 mL via INTRAVENOUS

## 2024-06-13 ENCOUNTER — Encounter: Payer: Self-pay | Admitting: Surgery

## 2024-06-13 ENCOUNTER — Ambulatory Visit: Admitting: Surgery

## 2024-06-13 VITALS — BP 128/88 | HR 94 | Temp 98.3°F | Ht 65.0 in | Wt 261.6 lb

## 2024-06-13 DIAGNOSIS — R222 Localized swelling, mass and lump, trunk: Secondary | ICD-10-CM | POA: Diagnosis not present

## 2024-06-13 NOTE — Patient Instructions (Signed)
 Lipoma  A lipoma is a noncancerous (benign) tumor that is made up of fat cells. This is a very common type of soft-tissue growth. Lipomas are usually found under the skin (subcutaneous). They may occur in any tissue of the body that contains fat. Common areas for lipomas to appear include the back, arms, shoulders, buttocks, and thighs. Lipomas grow slowly, and they are usually painless. Most lipomas do not cause problems and do not require treatment. What are the causes? The cause of this condition is not known. What increases the risk? You are more likely to develop this condition if: You are 18-42 years old. You have a family history of lipomas. What are the signs or symptoms? A lipoma usually appears as a small, round bump under the skin. In most cases, the lump will: Feel soft or rubbery. Not cause pain or other symptoms. However, if a lipoma is located in an area where it pushes on nerves, it can become painful or cause other symptoms. How is this diagnosed? A lipoma can usually be diagnosed with a physical exam. You may also have tests to confirm the diagnosis and to rule out other conditions. Tests may include: Imaging tests, such as a CT scan or an MRI. Removal of a tissue sample to be looked at under a microscope (biopsy). How is this treated? Treatment for this condition depends on the size of the lipoma and whether it is causing any symptoms. For small lipomas that are not causing problems, no treatment is needed. If a lipoma is bigger or it causes problems, surgery may be done to remove the lipoma. Lipomas can also be removed to improve appearance. Most often, the procedure is done after applying a medicine that numbs the area (local anesthetic). Liposuction may be done to reduce the size of the lipoma before it is removed through surgery, or it may be done to remove the lipoma. Lipomas are removed with this method to limit incision size and scarring. A liposuction tube is  inserted through a small incision into the lipoma, and the contents of the lipoma are removed through the tube with suction. Follow these instructions at home: Watch your lipoma for any changes. Keep all follow-up visits. This is important. Where to find more information OrthoInfo: orthoinfo.aaos.org Contact a health care provider if: Your lipoma becomes larger or hard. Your lipoma becomes painful, red, or increasingly swollen. These could be signs of infection or a more serious condition. Get help right away if: You develop tingling or numbness in an area near the lipoma. This could indicate that the lipoma is causing nerve damage. Summary A lipoma is a noncancerous tumor that is made up of fat cells. Most lipomas do not cause problems and do not require treatment. If a lipoma is bigger or it causes problems, surgery may be done to remove the lipoma. Contact a health care provider if your lipoma becomes larger or hard, or if it becomes painful, red, or increasingly swollen. These could be signs of infection or a more serious condition. This information is not intended to replace advice given to you by your health care provider. Make sure you discuss any questions you have with your health care provider. Document Revised: 07/31/2021 Document Reviewed: 07/31/2021 Elsevier Patient Education  2024 [employer].

## 2024-06-13 NOTE — Progress Notes (Signed)
 Outpatient Surgical Follow Up  06/13/2024  Kelli Abbott is an 68 y.o. female.   Chief Complaint  Patient presents with   Follow-up    Upper back mass    HPI: Kelli Abbott is a very pleasanr female seen in f/u for upper thoracic soft tissue mass.  She reports she has had it for years.  Was removed in 2019 and shortly after had recurrence.  She does have complex surgical history and history of bilateral breast  cancer . She reports that she has pain associated to the mass that is dull mild intermittent without aggravating or elevating factors.  She also reports that the mass has grown in size. 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  She did not undergo reexcision and completed adjuvant radiation treatment and radiation boost to the positive margin.    She did have recent mammogram that I personally reviewed showing no evidence of suspicious lesions on either breast We repeated CT that I have pers reviewed showing soft tissue mass w/o evidence of sarcoma features  Past Medical History:  Diagnosis Date   Anemia    Arthritis    Osteoarthritis   Breast cancer (HCC)    Breast lump    Breast nodule    hypoechoic in right breast   Bursitis of shoulder region    Cancer [facility])    breast   Family history of breast cancer 11/06/2020   Family history of pancreatic cancer 11/06/2020   Hypertension    Mass on back    Personal history of radiation therapy 2009/2010   PONV (postoperative nausea and vomiting)    Skin lesions, generalized     Past Surgical History:  Procedure Laterality Date   BREAST BIOPSY Right 04/29/2008   malignant   BREAST BIOPSY Right 11/24/2015   benign    BREAST LUMPECTOMY Right 06/04/2008   BREAST LUMPECTOMY WITH NEEDLE LOCALIZATION Left 02/18/2021   Procedure: BREAST LUMPECTOMY WITH NEEDLE LOCALIZATION;  Surgeon: Dessa Reyes ORN, MD;  Location: [facility] ORS;  Service: General;  Laterality: Left;  combined case with Dr. Lowery   BREAST SURGERY  2009   R breast reexcision of margins   HYSTEROSCOPY WITH D & C N/A 11/06/2014   Procedure: DILATATION AND CURETTAGE /HYSTEROSCOPY with resection and myomectomy;  Surgeon: Hargis Paradise, MD;  Location: [facility] OR[facility];  Service: Gynecology;  Laterality: N/A;   MASS EXCISION N/A 01/17/2018   Procedure: EXCISION  OF UPPER BACK MASS AND SKIN TAGS;  Surgeon: Aron Shoulders, MD;  Location: [facility] OR;  Service: General;  Laterality: N/A;   TOTAL HIP ARTHROPLASTY Bilateral 04/2020   right   UNILATERAL BREAST REDUCTION Left 02/18/2021   Procedure: LEFT ONCOPLASTIC BREAST REDUCTION;  Surgeon: Lowery Estefana RAMAN, DO;  Location: [facility] ORS;  Service: Plastics;  Laterality: Left;    Family History  Problem Relation Age of Onset   Hypertension Mother    Hypertension Father    Hypertension Brother    Breast cancer Paternal Aunt        dx before 12   Pancreatic cancer Paternal Grandfather        dx 93s    Social History:  reports that she quit smoking about 16 years ago. Her  smoking use included cigarettes. She started smoking about 36 years ago. She has a 20 pack-year smoking history. She has been exposed to tobacco smoke. She has never used smokeless tobacco. She reports that she does not currently use alcohol. She reports that she does not use drugs.  Allergies:  Allergies  Allergen Reactions   Bupropion  Hcl     Contributing to memory loss    Medications reviewed.    ROS Full ROS performed and is otherwise negative other than what is stated in HPI   BP 128/88   Pulse 94   Temp 98.3 F (36.8 C) (Oral)   Ht 5' 5 (1.651 m)   Wt 261 lb 9.6 oz (118.7 kg)   SpO2 98%   BMI 43.53 kg/m   Physical  Exam  Physical Exam Vitals and nursing note reviewed. Exam conducted with a chaperone present.  Constitutional:      Appearance: Normal appearance. She is obese. She is not ill-appearing.  Eyes:     General: No scleral icterus.       Right eye: No discharge.        Left eye: No discharge.  Cardiovascular:     Rate and Rhythm: Normal rate and regular rhythm.     Pulses: Normal pulses.     Heart sounds: Normal heart sounds. No murmur heard. Pulmonary:     Effort: Pulmonary effort is normal.     Breath sounds: Normal breath sounds.     Comments: BREAST: Chaperone present.  There is asymmetry but no definitive masses or lesions  Abdominal:     General: Abdomen is flat. There is no distension.     Palpations: Abdomen is soft. There is no mass.     Tenderness: There is no abdominal tenderness.     Hernia: No hernia is present.  Musculoskeletal:        General: No tenderness. Normal range of motion.     Cervical back: Normal range of motion and neck supple. No rigidity or tenderness.  Lymphadenopathy:     Cervical: No cervical adenopathy.  Skin:    General: Skin is warm and dry.     Comments: There is a 14 x 12 cm soft tissue mass located in within the subcutaneous tissue upper thoracic area it is soft and mobile.  This is consistent with lipoma  Neurological:     General: No focal deficit present.     Mental Status: She is alert and oriented to person, place, and time.  Psychiatric:        Mood and Affect: Mood normal.        Behavior: Behavior normal.        Thought Content: Thought content normal.        Judgment: Judgment normal.     Assessment/Plan: Sub q tissue upper thoracic back Discussed with the patient in detail given its size I do think that we need to obtain imaging.  I had a good discussion with the pt, we wil need to do this under GETA in the OR due to the size. Pt is a bit hesitant about being put to sleep and wishes to think about it.We will be happy to see her  back in a few months   I personally spent a total of 30 minutes in the care of the patient today including performing a medically appropriate exam/evaluation, counseling and educating, placing orders, referring and communicating with other health care professionals, documenting clinical information in the EHR, independently interpreting  and reviewing images studies and coordinating care.   Laneta Luna, MD [facility] General Surgeon

## 2024-07-12 ENCOUNTER — Telehealth: Payer: Self-pay | Admitting: Plastic Surgery

## 2024-07-12 ENCOUNTER — Other Ambulatory Visit (HOSPITAL_COMMUNITY): Payer: Self-pay | Admitting: *Deleted

## 2024-07-12 DIAGNOSIS — F4310 Post-traumatic stress disorder, unspecified: Secondary | ICD-10-CM

## 2024-07-12 DIAGNOSIS — F419 Anxiety disorder, unspecified: Secondary | ICD-10-CM

## 2024-07-12 NOTE — Telephone Encounter (Signed)
 Called 07-12-24 to r/s and mail box is full

## 2024-07-27 ENCOUNTER — Telehealth: Payer: Self-pay | Admitting: Plastic Surgery

## 2024-07-27 NOTE — Telephone Encounter (Signed)
 Called 07-27-24 to r/s and mail box is full   Called 07-12-24 to r/s and mail box is full  Consult C50.411,Z17.0 (ICD-10-CM) - Malignant neoplasm of upper-outer quadrant of right breast in female, estrogen receptor positive [facility])  Provider out of office

## 2024-07-30 ENCOUNTER — Telehealth: Payer: Self-pay | Admitting: Plastic Surgery

## 2024-07-30 NOTE — Telephone Encounter (Signed)
 Called 07-30-24 mail box full we can r/s when pt calls back Called 07-27-24 to r/s and mail box is full   Called 07-12-24 to r/s and mail box is full  Consult C50.411,Z17.0 (ICD-10-CM) - Malignant neoplasm of upper-outer quadrant of right breast in female, estrogen receptor positive (HCC)

## 2024-08-02 ENCOUNTER — Ambulatory Visit (HOSPITAL_COMMUNITY): Admitting: Psychiatry

## 2024-08-03 ENCOUNTER — Institutional Professional Consult (permissible substitution): Admitting: Plastic Surgery

## 2024-08-15 ENCOUNTER — Ambulatory Visit: Admitting: Surgery

## 2024-11-09 ENCOUNTER — Inpatient Hospital Stay: Admitting: Oncology
